# Patient Record
Sex: Male | Born: 1960 | Race: Black or African American | Hispanic: No | Marital: Single | Smoking: Former smoker
Health system: Southern US, Community
[De-identification: ages and names within clinical notes are randomized; demographics above are authoritative.]

## PROBLEM LIST (undated history)

## (undated) DIAGNOSIS — J45909 Unspecified asthma, uncomplicated: Secondary | ICD-10-CM

## (undated) DIAGNOSIS — F411 Generalized anxiety disorder: Secondary | ICD-10-CM

## (undated) DIAGNOSIS — R569 Unspecified convulsions: Secondary | ICD-10-CM

## (undated) DIAGNOSIS — E739 Lactose intolerance, unspecified: Secondary | ICD-10-CM

## (undated) DIAGNOSIS — C801 Malignant (primary) neoplasm, unspecified: Secondary | ICD-10-CM

## (undated) DIAGNOSIS — M722 Plantar fascial fibromatosis: Secondary | ICD-10-CM

## (undated) DIAGNOSIS — I10 Essential (primary) hypertension: Secondary | ICD-10-CM

## (undated) DIAGNOSIS — R011 Cardiac murmur, unspecified: Secondary | ICD-10-CM

## (undated) DIAGNOSIS — F329 Major depressive disorder, single episode, unspecified: Secondary | ICD-10-CM

## (undated) DIAGNOSIS — M25519 Pain in unspecified shoulder: Secondary | ICD-10-CM

## (undated) DIAGNOSIS — H01009 Unspecified blepharitis unspecified eye, unspecified eyelid: Secondary | ICD-10-CM

## (undated) DIAGNOSIS — Z87442 Personal history of urinary calculi: Secondary | ICD-10-CM

## (undated) DIAGNOSIS — F1010 Alcohol abuse, uncomplicated: Secondary | ICD-10-CM

## (undated) DIAGNOSIS — E785 Hyperlipidemia, unspecified: Secondary | ICD-10-CM

## (undated) HISTORY — DX: Personal history of urinary calculi: Z87.442

## (undated) HISTORY — DX: Unspecified blepharitis unspecified eye, unspecified eyelid: H01.009

## (undated) HISTORY — DX: Lactose intolerance, unspecified: E73.9

## (undated) HISTORY — DX: Generalized anxiety disorder: F41.1

## (undated) HISTORY — PX: HAND SURGERY: SHX662

## (undated) HISTORY — DX: Hyperlipidemia, unspecified: E78.5

## (undated) HISTORY — DX: Major depressive disorder, single episode, unspecified: F32.9

## (undated) HISTORY — DX: Plantar fascial fibromatosis: M72.2

## (undated) HISTORY — DX: Pain in unspecified shoulder: M25.519

## (undated) HISTORY — DX: Unspecified asthma, uncomplicated: J45.909

## (undated) HISTORY — DX: Essential (primary) hypertension: I10

---

## 1999-03-11 ENCOUNTER — Emergency Department (HOSPITAL_COMMUNITY): Admission: EM | Admit: 1999-03-11 | Discharge: 1999-03-11 | Payer: Self-pay | Admitting: Emergency Medicine

## 1999-03-11 ENCOUNTER — Encounter: Payer: Self-pay | Admitting: Emergency Medicine

## 1999-03-12 ENCOUNTER — Emergency Department (HOSPITAL_COMMUNITY): Admission: EM | Admit: 1999-03-12 | Discharge: 1999-03-12 | Payer: Self-pay | Admitting: Emergency Medicine

## 2003-01-09 ENCOUNTER — Emergency Department (HOSPITAL_COMMUNITY): Admission: EM | Admit: 2003-01-09 | Discharge: 2003-01-09 | Payer: Self-pay | Admitting: Emergency Medicine

## 2003-11-22 ENCOUNTER — Emergency Department (HOSPITAL_COMMUNITY): Admission: EM | Admit: 2003-11-22 | Discharge: 2003-11-22 | Payer: Self-pay | Admitting: Emergency Medicine

## 2004-05-08 ENCOUNTER — Emergency Department (HOSPITAL_COMMUNITY): Admission: EM | Admit: 2004-05-08 | Discharge: 2004-05-09 | Payer: Self-pay | Admitting: Emergency Medicine

## 2005-06-04 HISTORY — PX: OTHER SURGICAL HISTORY: SHX169

## 2005-08-31 ENCOUNTER — Emergency Department (HOSPITAL_COMMUNITY): Admission: EM | Admit: 2005-08-31 | Discharge: 2005-08-31 | Payer: Self-pay | Admitting: Emergency Medicine

## 2007-07-14 ENCOUNTER — Emergency Department (HOSPITAL_COMMUNITY): Admission: EM | Admit: 2007-07-14 | Discharge: 2007-07-14 | Payer: Self-pay | Admitting: Emergency Medicine

## 2007-07-16 ENCOUNTER — Encounter: Payer: Self-pay | Admitting: Internal Medicine

## 2007-07-22 ENCOUNTER — Ambulatory Visit: Payer: Self-pay | Admitting: Internal Medicine

## 2007-07-22 DIAGNOSIS — E739 Lactose intolerance, unspecified: Secondary | ICD-10-CM

## 2007-07-22 DIAGNOSIS — F3289 Other specified depressive episodes: Secondary | ICD-10-CM

## 2007-07-22 DIAGNOSIS — F411 Generalized anxiety disorder: Secondary | ICD-10-CM | POA: Insufficient documentation

## 2007-07-22 DIAGNOSIS — I10 Essential (primary) hypertension: Secondary | ICD-10-CM

## 2007-07-22 DIAGNOSIS — F329 Major depressive disorder, single episode, unspecified: Secondary | ICD-10-CM

## 2007-07-22 DIAGNOSIS — Z87442 Personal history of urinary calculi: Secondary | ICD-10-CM

## 2007-07-22 DIAGNOSIS — E785 Hyperlipidemia, unspecified: Secondary | ICD-10-CM

## 2007-07-22 HISTORY — DX: Hyperlipidemia, unspecified: E78.5

## 2007-07-22 HISTORY — DX: Other specified depressive episodes: F32.89

## 2007-07-22 HISTORY — DX: Essential (primary) hypertension: I10

## 2007-07-22 HISTORY — DX: Personal history of urinary calculi: Z87.442

## 2007-07-22 HISTORY — DX: Major depressive disorder, single episode, unspecified: F32.9

## 2007-07-22 HISTORY — DX: Generalized anxiety disorder: F41.1

## 2007-07-22 HISTORY — DX: Lactose intolerance, unspecified: E73.9

## 2007-07-22 LAB — CONVERTED CEMR LAB
ALT: 20 units/L (ref 0–53)
Albumin: 3.9 g/dL (ref 3.5–5.2)
Alkaline Phosphatase: 59 units/L (ref 39–117)
BUN: 7 mg/dL (ref 6–23)
Basophils Absolute: 0 10*3/uL (ref 0.0–0.1)
Basophils Relative: 1.2 % — ABNORMAL HIGH (ref 0.0–1.0)
Bilirubin Urine: NEGATIVE
CO2: 29 meq/L (ref 19–32)
Calcium: 9 mg/dL (ref 8.4–10.5)
Chloride: 105 meq/L (ref 96–112)
Creatinine, Ser: 0.9 mg/dL (ref 0.4–1.5)
HDL: 53 mg/dL (ref >39.0)
Hemoglobin, Urine: NEGATIVE
Ketones, ur: NEGATIVE mg/dL
LDL Cholesterol: 117 mg/dL — ABNORMAL HIGH (ref 0–99)
Leukocytes, UA: NEGATIVE
MCHC: 34 g/dL (ref 30.0–36.0)
Monocytes Relative: 9.5 % (ref 3.0–11.0)
PSA: 1.18 ng/mL (ref 0.10–4.00)
Platelets: 206 10*3/uL (ref 150–400)
Potassium: 3.9 meq/L (ref 3.5–5.1)
RBC: 4.38 M/uL (ref 4.22–5.81)
RDW: 13.1 % (ref 11.5–14.6)
Specific Gravity, Urine: 1.02 (ref 1.000–1.03)
Total Bilirubin: 0.5 mg/dL (ref 0.3–1.2)
Total Protein, Urine: NEGATIVE mg/dL
Triglycerides: 70 mg/dL (ref 0–149)
VLDL: 14 mg/dL (ref 0–40)
pH: 6.5 (ref 5.0–8.0)

## 2007-07-29 ENCOUNTER — Telehealth (INDEPENDENT_AMBULATORY_CARE_PROVIDER_SITE_OTHER): Payer: Self-pay | Admitting: *Deleted

## 2007-08-08 ENCOUNTER — Encounter: Payer: Self-pay | Admitting: Internal Medicine

## 2007-08-19 ENCOUNTER — Ambulatory Visit: Payer: Self-pay | Admitting: Internal Medicine

## 2008-01-03 ENCOUNTER — Emergency Department (HOSPITAL_COMMUNITY): Admission: EM | Admit: 2008-01-03 | Discharge: 2008-01-03 | Payer: Self-pay | Admitting: Emergency Medicine

## 2008-09-11 ENCOUNTER — Emergency Department (HOSPITAL_COMMUNITY): Admission: EM | Admit: 2008-09-11 | Discharge: 2008-09-11 | Payer: Self-pay | Admitting: Emergency Medicine

## 2008-10-12 ENCOUNTER — Ambulatory Visit: Payer: Self-pay | Admitting: Internal Medicine

## 2008-10-12 LAB — CONVERTED CEMR LAB
Albumin: 3.9 g/dL (ref 3.5–5.2)
BUN: 12 mg/dL (ref 6–23)
Basophils Absolute: 0.2 10*3/uL — ABNORMAL HIGH (ref 0.0–0.1)
Bilirubin, Direct: 0.1 mg/dL (ref 0.0–0.3)
CO2: 32 meq/L (ref 19–32)
Chloride: 105 meq/L (ref 96–112)
Cholesterol: 204 mg/dL — ABNORMAL HIGH (ref 0–200)
Direct LDL: 137.9 mg/dL
Hemoglobin, Urine: NEGATIVE
Lymphocytes Relative: 52.8 % — ABNORMAL HIGH (ref 12.0–46.0)
Monocytes Relative: 9.1 % (ref 3.0–12.0)
Neutrophils Relative %: 23.3 % — ABNORMAL LOW (ref 43.0–77.0)
Nitrite: NEGATIVE
Platelets: 185 10*3/uL (ref 150.0–400.0)
Potassium: 4.2 meq/L (ref 3.5–5.1)
RDW: 12.9 % (ref 11.5–14.6)
TSH: 1.64 microintl units/mL (ref 0.35–5.50)
Total CHOL/HDL Ratio: 4
Total Protein: 7.3 g/dL (ref 6.0–8.3)
Triglycerides: 63 mg/dL (ref 0.0–149.0)
Urobilinogen, UA: 0.2 (ref 0.0–1.0)
VLDL: 12.6 mg/dL (ref 0.0–40.0)

## 2008-10-13 ENCOUNTER — Ambulatory Visit: Payer: Self-pay | Admitting: Internal Medicine

## 2008-10-25 ENCOUNTER — Encounter: Admission: RE | Admit: 2008-10-25 | Discharge: 2008-10-25 | Payer: Self-pay | Admitting: Occupational Medicine

## 2009-02-14 ENCOUNTER — Emergency Department (HOSPITAL_COMMUNITY): Admission: EM | Admit: 2009-02-14 | Discharge: 2009-02-14 | Payer: Self-pay | Admitting: Emergency Medicine

## 2009-02-24 ENCOUNTER — Ambulatory Visit: Payer: Self-pay | Admitting: Internal Medicine

## 2009-02-24 DIAGNOSIS — H01009 Unspecified blepharitis unspecified eye, unspecified eyelid: Secondary | ICD-10-CM

## 2009-02-24 HISTORY — DX: Unspecified blepharitis unspecified eye, unspecified eyelid: H01.009

## 2009-03-31 ENCOUNTER — Ambulatory Visit (HOSPITAL_BASED_OUTPATIENT_CLINIC_OR_DEPARTMENT_OTHER): Admission: RE | Admit: 2009-03-31 | Discharge: 2009-03-31 | Payer: Self-pay | Admitting: Ophthalmology

## 2009-11-24 ENCOUNTER — Ambulatory Visit: Payer: Self-pay | Admitting: Internal Medicine

## 2009-11-24 LAB — CONVERTED CEMR LAB
AST: 18 units/L (ref 0–37)
Basophils Absolute: 0 10*3/uL (ref 0.0–0.1)
CO2: 30 meq/L (ref 19–32)
Calcium: 9.2 mg/dL (ref 8.4–10.5)
Chloride: 105 meq/L (ref 96–112)
HCT: 42 % (ref 39.0–52.0)
HDL: 60.7 mg/dL (ref >39.00)
Leukocytes, UA: NEGATIVE
Lymphs Abs: 1.5 10*3/uL (ref 0.7–4.0)
Monocytes Absolute: 0.2 10*3/uL (ref 0.1–1.0)
Monocytes Relative: 7.5 % (ref 3.0–12.0)
Nitrite: NEGATIVE
PSA: 1.25 ng/mL (ref 0.10–4.00)
Platelets: 199 10*3/uL (ref 150.0–400.0)
Potassium: 4.4 meq/L (ref 3.5–5.1)
RDW: 14.4 % (ref 11.5–14.6)
Sodium: 141 meq/L (ref 135–145)
Specific Gravity, Urine: >=1.03 (ref 1.000–1.030)
TSH: 0.68 microintl units/mL (ref 0.35–5.50)
Total Bilirubin: 0.7 mg/dL (ref 0.3–1.2)
Total CHOL/HDL Ratio: 3
Triglycerides: 86 mg/dL (ref 0.0–149.0)
Urobilinogen, UA: 1 (ref 0.0–1.0)

## 2009-11-25 ENCOUNTER — Ambulatory Visit: Payer: Self-pay | Admitting: Internal Medicine

## 2009-11-25 DIAGNOSIS — M722 Plantar fascial fibromatosis: Secondary | ICD-10-CM

## 2009-11-25 DIAGNOSIS — M25519 Pain in unspecified shoulder: Secondary | ICD-10-CM

## 2009-11-25 HISTORY — DX: Plantar fascial fibromatosis: M72.2

## 2009-11-25 HISTORY — DX: Pain in unspecified shoulder: M25.519

## 2009-11-30 ENCOUNTER — Encounter: Payer: Self-pay | Admitting: Internal Medicine

## 2009-12-01 ENCOUNTER — Encounter: Payer: Self-pay | Admitting: Internal Medicine

## 2010-05-05 ENCOUNTER — Telehealth: Payer: Self-pay | Admitting: Internal Medicine

## 2010-05-06 ENCOUNTER — Emergency Department (HOSPITAL_COMMUNITY)
Admission: EM | Admit: 2010-05-06 | Discharge: 2010-05-06 | Payer: Self-pay | Source: Home / Self Care | Admitting: Emergency Medicine

## 2010-07-04 NOTE — Consult Note (Signed)
Summary: [facility]  [facility]   Imported By: Lester Foley 12/15/2009 07:43:29  _____________________________________________________________________  External Attachment:    Type:   Image     Comment:   External Document

## 2010-07-04 NOTE — Progress Notes (Signed)
Summary: ST Cough  Phone Note Call from Patient   Caller: Patient [phone]  [phone] Summary of Call: Pt called stating he has a severe ST and cough, he has tried OTC Mucinex wth no relief. Pt is requesting MD advisement on OTC meds to help with sxs that are safe to use with Hypertention, please advise. Initial call taken by: Margaret Pyle, CMA,  May 05, 2010 1:05 PM  Follow-up for Phone Call        delsym OTC (12 hr) usually works well; ok for tessalon perle 100 mg - 1 -2 by mouth three times a day as needed #60 NO ref if he wants Follow-up by: Corwin Levins MD,  May 05, 2010 2:06 PM  Additional Follow-up for Phone Call Additional follow up Details #1::        Pt advised and requested RX as well. Additional Follow-up by: Margaret Pyle, CMA,  May 05, 2010 3:26 PM    New/Updated Medications: TESSALON PERLES 100 MG CAPS (BENZONATATE) 1-2 caps by mouth three times a day as needed for cough Prescriptions: TESSALON PERLES 100 MG CAPS (BENZONATATE) 1-2 caps by mouth three times a day as needed for cough  #60 x 0   Entered by:   Margaret Pyle, CMA   Authorized by:   Corwin Levins MD   Signed by:   Margaret Pyle, CMA on 05/05/2010   Method used:   Electronically to        [facility]. [phone]* (retail)       [address]       [place], [place]  [address]       Ph: [phone]       Fax: [phone]   RxID:   [id number]

## 2010-07-04 NOTE — Consult Note (Signed)
Summary: [facility]   Imported By: Sherian Rein 12/07/2009 14:42:18  _____________________________________________________________________  External Attachment:    Type:   Image     Comment:   External Document
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
## 2011-01-24 ENCOUNTER — Other Ambulatory Visit: Payer: Self-pay

## 2011-01-24 MED ORDER — LISINOPRIL 20 MG PO TABS: 20.0000 mg | ORAL_TABLET | Freq: Every day | ORAL | Status: DC

## 2011-02-02 ENCOUNTER — Other Ambulatory Visit: Payer: Self-pay | Admitting: Internal Medicine

## 2011-02-23 LAB — BASIC METABOLIC PANEL
BUN: 9
Calcium: 8.9
GFR calc non Af Amer: >60
Potassium: 3.8
Sodium: 140

## 2011-02-23 LAB — URINALYSIS, ROUTINE W REFLEX MICROSCOPIC
Leukocytes, UA: NEGATIVE
Protein, ur: NEGATIVE
Urobilinogen, UA: 0.2

## 2011-02-23 LAB — BASIC METABOLIC PANEL WITH GFR
CO2: 28
Chloride: 105
Creatinine, Ser: 0.81
GFR calc Af Amer: >60
Glucose, Bld: 96

## 2011-02-23 LAB — URINE MICROSCOPIC-ADD ON

## 2011-02-23 LAB — CBC
MCHC: 35.2
Platelets: 192
RDW: 14.1

## 2011-03-04 ENCOUNTER — Encounter: Payer: Self-pay | Admitting: Internal Medicine

## 2011-03-04 DIAGNOSIS — Z0001 Encounter for general adult medical examination with abnormal findings: Secondary | ICD-10-CM | POA: Insufficient documentation

## 2011-03-04 DIAGNOSIS — R7302 Impaired glucose tolerance (oral): Secondary | ICD-10-CM | POA: Insufficient documentation

## 2011-03-05 ENCOUNTER — Other Ambulatory Visit (INDEPENDENT_AMBULATORY_CARE_PROVIDER_SITE_OTHER): Payer: Managed Care, Other (non HMO)

## 2011-03-05 ENCOUNTER — Ambulatory Visit (INDEPENDENT_AMBULATORY_CARE_PROVIDER_SITE_OTHER): Payer: Managed Care, Other (non HMO) | Admitting: Internal Medicine

## 2011-03-05 ENCOUNTER — Other Ambulatory Visit: Payer: Self-pay | Admitting: Internal Medicine

## 2011-03-05 ENCOUNTER — Encounter: Payer: Self-pay | Admitting: Internal Medicine

## 2011-03-05 VITALS — BP 138/100 | HR 92 | Temp 97.8°F | Ht 69.0 in | Wt 216.4 lb

## 2011-03-05 DIAGNOSIS — Z0000 Encounter for general adult medical examination without abnormal findings: Secondary | ICD-10-CM

## 2011-03-05 DIAGNOSIS — R319 Hematuria, unspecified: Secondary | ICD-10-CM | POA: Insufficient documentation

## 2011-03-05 DIAGNOSIS — I10 Essential (primary) hypertension: Secondary | ICD-10-CM

## 2011-03-05 DIAGNOSIS — Z23 Encounter for immunization: Secondary | ICD-10-CM

## 2011-03-05 DIAGNOSIS — R7309 Other abnormal glucose: Secondary | ICD-10-CM

## 2011-03-05 DIAGNOSIS — R7302 Impaired glucose tolerance (oral): Secondary | ICD-10-CM

## 2011-03-05 LAB — BASIC METABOLIC PANEL
BUN: 16 mg/dL (ref 6–23)
Chloride: 102 mEq/L (ref 96–112)
Creatinine, Ser: 0.8 mg/dL (ref 0.4–1.5)
Glucose, Bld: 98 mg/dL (ref 70–99)

## 2011-03-05 LAB — HEPATIC FUNCTION PANEL
ALT: 16 U/L (ref 0–53)
AST: 21 U/L (ref 0–37)
Albumin: 4.2 g/dL (ref 3.5–5.2)
Total Bilirubin: 0.7 mg/dL (ref 0.3–1.2)
Total Protein: 7.7 g/dL (ref 6.0–8.3)

## 2011-03-05 LAB — LIPID PANEL
Cholesterol: 202 mg/dL — ABNORMAL HIGH (ref 0–200)
HDL: 63.8 mg/dL (ref >39.00)
VLDL: 9.6 mg/dL (ref 0.0–40.0)

## 2011-03-05 LAB — URINALYSIS, ROUTINE W REFLEX MICROSCOPIC
Bilirubin Urine: NEGATIVE
Leukocytes, UA: NEGATIVE
Nitrite: NEGATIVE
Specific Gravity, Urine: >=1.03 (ref 1.000–1.030)
Total Protein, Urine: NEGATIVE
pH: 5.5 (ref 5.0–8.0)

## 2011-03-05 LAB — CBC WITH DIFFERENTIAL/PLATELET
Basophils Relative: 0.6 % (ref 0.0–3.0)
Eosinophils Relative: 7.9 % — ABNORMAL HIGH (ref 0.0–5.0)
Lymphocytes Relative: 47.1 % — ABNORMAL HIGH (ref 12.0–46.0)
MCV: 96.2 fl (ref 78.0–100.0)
Monocytes Relative: 9.1 % (ref 3.0–12.0)
Neutrophils Relative %: 35.3 % — ABNORMAL LOW (ref 43.0–77.0)
Platelets: 213 10*3/uL (ref 150.0–400.0)
RBC: 4.53 Mil/uL (ref 4.22–5.81)
WBC: 3.6 10*3/uL — ABNORMAL LOW (ref 4.5–10.5)

## 2011-03-05 LAB — TSH: TSH: 1.75 u[IU]/mL (ref 0.35–5.50)

## 2011-03-05 LAB — PSA: PSA: 1.3 ng/mL (ref 0.10–4.00)

## 2011-03-05 MED ORDER — LISINOPRIL 20 MG PO TABS: 20.0000 mg | ORAL_TABLET | Freq: Every day | ORAL | Status: DC

## 2011-03-05 MED ORDER — DICLOFENAC SODIUM 75 MG PO TBEC: DELAYED_RELEASE_TABLET | ORAL | Status: DC

## 2011-03-05 NOTE — Assessment & Plan Note (Signed)
Likely related to renal stone, for u/s to r/o obstruction or mass

## 2011-03-05 NOTE — Assessment & Plan Note (Signed)
Out of med  - uncontrolled today - for med re-start ,  to f/u any worsening symptoms or concerns  BP Readings from Last 3 Encounters:  03/05/11 138/100  11/25/09 142/98  02/24/09 154/110
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
## 2011-03-05 NOTE — Patient Instructions (Addendum)
You had the flu shot today You will be contacted regarding the referral for: kidney ultrasound to make sure no obstruction or mass Please return if you have any further blood in the urine, or call for urology referral Continue all other medications as before Please go to LAB in the Basement for the blood and/or urine tests to be done today Please call the phone number [phone] (the [employer] System) for results of testing in 2-3 days;  When calling, simply dial the number, and when prompted enter the MRN number above (the Medical Record Number) and the # key, then the message should start. Please return in 1 year for your yearly visit, or sooner if needed, with Lab testing done 3-5 days before

## 2011-03-05 NOTE — Assessment & Plan Note (Addendum)
Overall doing well, age appropriate education and counseling updated, referrals for preventative services and immunizations addressed, dietary and smoking counseling addressed, most recent labs and ECG reviewed.  I have personally reviewed and have noted: 1) the patient's medical and social history 2) The pt's use of alcohol, tobacco, and illicit drugs 3) The patient's current medications and supplements 4) Functional ability including ADL's, fall risk, home safety risk, hearing and visual impairment 5) Diet and physical activities 6) Evidence for depression or mood disorder 7) The patient's height, weight, and BMI have been recorded in the chart I have made referrals, and provided counseling and education based on review of the above For flu shot, labs today, ECG reviewed as per emr

## 2011-03-05 NOTE — Assessment & Plan Note (Signed)
asymtp - for a1c today 

## 2011-03-08 ENCOUNTER — Other Ambulatory Visit: Payer: Managed Care, Other (non HMO)

## 2011-03-22 ENCOUNTER — Ambulatory Visit
Admission: RE | Admit: 2011-03-22 | Discharge: 2011-03-22 | Disposition: A | Discharge status: Home / Self Care | Payer: Managed Care, Other (non HMO) | Source: Ambulatory Visit | Attending: Internal Medicine | Admitting: Internal Medicine

## 2011-06-08 ENCOUNTER — Encounter: Payer: Self-pay | Admitting: Internal Medicine

## 2011-06-08 ENCOUNTER — Ambulatory Visit (INDEPENDENT_AMBULATORY_CARE_PROVIDER_SITE_OTHER): Payer: Managed Care, Other (non HMO) | Admitting: Internal Medicine

## 2011-06-08 VITALS — BP 144/100 | HR 89 | Temp 99.0°F | Ht 69.0 in | Wt 217.0 lb

## 2011-06-08 DIAGNOSIS — M25519 Pain in unspecified shoulder: Secondary | ICD-10-CM

## 2011-06-08 DIAGNOSIS — I10 Essential (primary) hypertension: Secondary | ICD-10-CM

## 2011-06-08 DIAGNOSIS — M25511 Pain in right shoulder: Secondary | ICD-10-CM | POA: Insufficient documentation

## 2011-06-08 DIAGNOSIS — B9789 Other viral agents as the cause of diseases classified elsewhere: Secondary | ICD-10-CM

## 2011-06-08 DIAGNOSIS — B349 Viral infection, unspecified: Secondary | ICD-10-CM

## 2011-06-08 MED ORDER — DIPHENOXYLATE-ATROPINE 2.5-0.025 MG PO TABS: 1.0000 | ORAL_TABLET | Freq: Four times a day (QID) | ORAL | Status: AC | PRN

## 2011-06-08 MED ORDER — OSELTAMIVIR PHOSPHATE 75 MG PO CAPS: 75.0000 mg | ORAL_CAPSULE | Freq: Two times a day (BID) | ORAL | Status: AC

## 2011-06-08 MED ORDER — PROMETHAZINE HCL 25 MG PO TABS: 25.0000 mg | ORAL_TABLET | Freq: Four times a day (QID) | ORAL | Status: AC | PRN

## 2011-06-08 NOTE — Assessment & Plan Note (Addendum)
?   Rot cuff vs DJD - for refer to [facility], Continue all other medications as before,  to f/u any worsening symptoms or concerns

## 2011-06-08 NOTE — Patient Instructions (Addendum)
You have what appears to be the flu today Take all new medications as prescribed - the Tamiflu, as well as the nausea and diarrhea medication if needed Continue all other medications as before Please drink lots of fluids, as well as tylenol or advil for pain if needed You are given the work note as discussed You will be contacted regarding the referral for: [facility]  The fax number here :  [phone]  Or [phone]

## 2011-06-09 ENCOUNTER — Encounter: Payer: Self-pay | Admitting: Internal Medicine

## 2011-06-09 DIAGNOSIS — B349 Viral infection, unspecified: Secondary | ICD-10-CM | POA: Insufficient documentation

## 2011-06-09 NOTE — Assessment & Plan Note (Signed)
clincial dx of influenza  - for tamilfu asd, to f/u any worsening symptoms or concerns

## 2011-06-09 NOTE — Assessment & Plan Note (Signed)
Mild increased today most likely situational, most recent data reviewed with pt, and pt to continue medical treatment as before  BP Readings from Last 3 Encounters:  06/08/11 144/100  03/05/11 138/100  11/25/09 142/98
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
## 2011-06-11 ENCOUNTER — Ambulatory Visit: Payer: Managed Care, Other (non HMO) | Admitting: Internal Medicine

## 2011-12-02 ENCOUNTER — Emergency Department (HOSPITAL_COMMUNITY): Payer: Managed Care, Other (non HMO)

## 2011-12-02 ENCOUNTER — Emergency Department (HOSPITAL_COMMUNITY)
Admission: EM | Admit: 2011-12-02 | Discharge: 2011-12-02 | Disposition: A | Discharge status: Home / Self Care | Payer: Managed Care, Other (non HMO) | Attending: Emergency Medicine | Admitting: Emergency Medicine

## 2011-12-02 ENCOUNTER — Encounter (HOSPITAL_COMMUNITY): Payer: Self-pay | Admitting: Emergency Medicine

## 2011-12-02 DIAGNOSIS — R109 Unspecified abdominal pain: Secondary | ICD-10-CM | POA: Insufficient documentation

## 2011-12-02 DIAGNOSIS — N201 Calculus of ureter: Secondary | ICD-10-CM

## 2011-12-02 DIAGNOSIS — I10 Essential (primary) hypertension: Secondary | ICD-10-CM | POA: Insufficient documentation

## 2011-12-02 LAB — URINALYSIS, ROUTINE W REFLEX MICROSCOPIC
Bilirubin Urine: NEGATIVE
Glucose, UA: NEGATIVE mg/dL
Specific Gravity, Urine: 1.017 (ref 1.005–1.030)
pH: 8 (ref 5.0–8.0)

## 2011-12-02 LAB — URINE MICROSCOPIC-ADD ON

## 2011-12-02 MED ORDER — ONDANSETRON 8 MG PO TBDP
8.0000 mg | ORAL_TABLET | Freq: Once | ORAL | Status: AC
  Administered 2011-12-02: 8 mg via ORAL
  Filled 2011-12-02: qty 1

## 2011-12-02 MED ORDER — ONDANSETRON HCL 4 MG PO TABS: 4.0000 mg | ORAL_TABLET | Freq: Four times a day (QID) | ORAL | Status: AC

## 2011-12-02 MED ORDER — HYDROMORPHONE HCL PF 1 MG/ML IJ SOLN
1.0000 mg | Freq: Once | INTRAMUSCULAR | Status: AC
Start: 2011-12-02 — End: 2011-12-02
  Administered 2011-12-02: 1 mg via INTRAMUSCULAR
  Filled 2011-12-02: qty 1

## 2011-12-02 MED ORDER — OXYCODONE-ACETAMINOPHEN 5-325 MG PO TABS: 2.0000 | ORAL_TABLET | ORAL | Status: AC | PRN

## 2011-12-02 MED ORDER — HYDROMORPHONE HCL PF 1 MG/ML IJ SOLN
1.0000 mg | Freq: Once | INTRAMUSCULAR | Status: AC
  Administered 2011-12-02: 1 mg via INTRAMUSCULAR
  Filled 2011-12-02: qty 1

## 2011-12-02 NOTE — ED Notes (Signed)
Pt alert, nad, arrives from home, c/o right flank pain, onset was a few nights ago, describes as sharp radiating to abd, resp even unlabored, skin pwd, denies changes in bowel or bladder
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
## 2011-12-02 NOTE — Discharge Instructions (Signed)

## 2011-12-02 NOTE — ED Notes (Signed)
Patient given discharge instructions, information, prescriptions, and diet order. Patient states that they adequately understand discharge information given and to return to ED if symptoms return or worsen.    Patient given 2 Rx's. Patient aware to follow up with urology and given contact information.

## 2011-12-02 NOTE — ED Notes (Addendum)
Pt sts pain, began 3-4 days ago, has gotten more intense. Patient has taken diclofenac at home with out relief. Patient sts he has been nauseated, no emesis, with sharp radiating right flank pain. No change in bowel or bladder habits.

## 2012-01-21 ENCOUNTER — Emergency Department (HOSPITAL_COMMUNITY)
Admission: EM | Admit: 2012-01-21 | Discharge: 2012-01-21 | Disposition: A | Discharge status: Home / Self Care | Payer: No Typology Code available for payment source | Attending: Emergency Medicine | Admitting: Emergency Medicine

## 2012-01-21 ENCOUNTER — Encounter (HOSPITAL_COMMUNITY): Payer: Self-pay | Admitting: Emergency Medicine

## 2012-01-21 ENCOUNTER — Emergency Department (HOSPITAL_COMMUNITY): Payer: No Typology Code available for payment source

## 2012-01-21 DIAGNOSIS — R7309 Other abnormal glucose: Secondary | ICD-10-CM | POA: Insufficient documentation

## 2012-01-21 DIAGNOSIS — Z7982 Long term (current) use of aspirin: Secondary | ICD-10-CM | POA: Insufficient documentation

## 2012-01-21 DIAGNOSIS — Z809 Family history of malignant neoplasm, unspecified: Secondary | ICD-10-CM | POA: Insufficient documentation

## 2012-01-21 DIAGNOSIS — Y92410 Unspecified street and highway as the place of occurrence of the external cause: Secondary | ICD-10-CM | POA: Insufficient documentation

## 2012-01-21 DIAGNOSIS — I10 Essential (primary) hypertension: Secondary | ICD-10-CM | POA: Insufficient documentation

## 2012-01-21 DIAGNOSIS — Z800 Family history of malignant neoplasm of digestive organs: Secondary | ICD-10-CM | POA: Insufficient documentation

## 2012-01-21 DIAGNOSIS — F411 Generalized anxiety disorder: Secondary | ICD-10-CM | POA: Insufficient documentation

## 2012-01-21 DIAGNOSIS — Z8249 Family history of ischemic heart disease and other diseases of the circulatory system: Secondary | ICD-10-CM | POA: Insufficient documentation

## 2012-01-21 DIAGNOSIS — M722 Plantar fascial fibromatosis: Secondary | ICD-10-CM | POA: Insufficient documentation

## 2012-01-21 DIAGNOSIS — E785 Hyperlipidemia, unspecified: Secondary | ICD-10-CM | POA: Insufficient documentation

## 2012-01-21 DIAGNOSIS — S46919A Strain of unspecified muscle, fascia and tendon at shoulder and upper arm level, unspecified arm, initial encounter: Secondary | ICD-10-CM

## 2012-01-21 DIAGNOSIS — F3289 Other specified depressive episodes: Secondary | ICD-10-CM | POA: Insufficient documentation

## 2012-01-21 DIAGNOSIS — Z8489 Family history of other specified conditions: Secondary | ICD-10-CM | POA: Insufficient documentation

## 2012-01-21 DIAGNOSIS — Z6379 Other stressful life events affecting family and household: Secondary | ICD-10-CM | POA: Insufficient documentation

## 2012-01-21 DIAGNOSIS — Z87891 Personal history of nicotine dependence: Secondary | ICD-10-CM | POA: Insufficient documentation

## 2012-01-21 DIAGNOSIS — Z833 Family history of diabetes mellitus: Secondary | ICD-10-CM | POA: Insufficient documentation

## 2012-01-21 DIAGNOSIS — Z87442 Personal history of urinary calculi: Secondary | ICD-10-CM | POA: Insufficient documentation

## 2012-01-21 DIAGNOSIS — F329 Major depressive disorder, single episode, unspecified: Secondary | ICD-10-CM | POA: Insufficient documentation

## 2012-01-21 MED ORDER — CYCLOBENZAPRINE HCL 10 MG PO TABS: 10.0000 mg | ORAL_TABLET | Freq: Two times a day (BID) | ORAL | Status: AC | PRN
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
## 2012-01-22 NOTE — ED Provider Notes (Signed)
Medical screening examination/treatment/procedure(s) were performed by non-physician practitioner and as supervising physician I was immediately available for consultation/collaboration.  Alleta Avery, MD 01/22/12 1520 

## 2012-02-26 ENCOUNTER — Ambulatory Visit (INDEPENDENT_AMBULATORY_CARE_PROVIDER_SITE_OTHER): Payer: Managed Care, Other (non HMO) | Admitting: Internal Medicine

## 2012-02-26 ENCOUNTER — Encounter: Payer: Self-pay | Admitting: Internal Medicine

## 2012-02-26 VITALS — BP 150/90 | HR 85 | Temp 98.3°F | Ht 69.0 in | Wt 221.6 lb

## 2012-02-26 DIAGNOSIS — M25512 Pain in left shoulder: Secondary | ICD-10-CM | POA: Insufficient documentation

## 2012-02-26 DIAGNOSIS — M25511 Pain in right shoulder: Secondary | ICD-10-CM

## 2012-02-26 DIAGNOSIS — M25519 Pain in unspecified shoulder: Secondary | ICD-10-CM

## 2012-02-26 DIAGNOSIS — I10 Essential (primary) hypertension: Secondary | ICD-10-CM

## 2012-02-26 MED ORDER — PREDNISONE 10 MG PO TABS: ORAL_TABLET | ORAL | Status: DC

## 2012-02-26 MED ORDER — IRBESARTAN 300 MG PO TABS: 300.0000 mg | ORAL_TABLET | Freq: Every day | ORAL | Status: DC

## 2012-02-26 MED ORDER — CYCLOBENZAPRINE HCL 5 MG PO TABS: 5.0000 mg | ORAL_TABLET | Freq: Three times a day (TID) | ORAL | Status: DC | PRN

## 2012-02-26 MED ORDER — TRAMADOL HCL 50 MG PO TABS: 50.0000 mg | ORAL_TABLET | Freq: Four times a day (QID) | ORAL | Status: DC | PRN
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
## 2012-02-26 NOTE — Assessment & Plan Note (Signed)
With prob rot cuff tear/disease, has seen ortho but pt deferring MRI/possible so far due to cost, cont pain control
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
## 2012-02-26 NOTE — Assessment & Plan Note (Signed)
Chart reviewed, persistent mild uncontrolled, to change Linsiopril to 300 qd avapro,  to f/u any worsening symptoms or concerns BP Readings from Last 3 Encounters:  02/26/12 150/90  01/21/12 165/102  12/02/11 158/90

## 2012-02-26 NOTE — Patient Instructions (Addendum)
Ok to stop the lisinopril Please start the generic Avapro at 300 mg per day Take all new medications as prescribed - also the ultram generic for pain, prednisone, and also muscle relaxer  You will be contacted regarding the referral for: orthopedic

## 2012-04-01 ENCOUNTER — Ambulatory Visit: Payer: Managed Care, Other (non HMO) | Admitting: Internal Medicine

## 2012-04-01 DIAGNOSIS — Z0289 Encounter for other administrative examinations: Secondary | ICD-10-CM

## 2012-06-03 ENCOUNTER — Other Ambulatory Visit: Payer: Self-pay | Admitting: Internal Medicine

## 2012-12-09 ENCOUNTER — Telehealth: Payer: Self-pay

## 2012-12-09 DIAGNOSIS — Z0000 Encounter for general adult medical examination without abnormal findings: Secondary | ICD-10-CM

## 2012-12-09 NOTE — Telephone Encounter (Signed)
Labs entered.

## 2013-01-01 ENCOUNTER — Other Ambulatory Visit (INDEPENDENT_AMBULATORY_CARE_PROVIDER_SITE_OTHER): Payer: Managed Care, Other (non HMO)

## 2013-01-01 ENCOUNTER — Other Ambulatory Visit: Payer: Self-pay | Admitting: Internal Medicine

## 2013-01-01 DIAGNOSIS — Z0000 Encounter for general adult medical examination without abnormal findings: Secondary | ICD-10-CM

## 2013-01-01 LAB — URINALYSIS, ROUTINE W REFLEX MICROSCOPIC
Bilirubin Urine: NEGATIVE
Nitrite: NEGATIVE
Urine Glucose: NEGATIVE
pH: 6 (ref 5.0–8.0)

## 2013-01-01 LAB — CBC WITH DIFFERENTIAL/PLATELET
Basophils Absolute: 0 10*3/uL (ref 0.0–0.1)
Eosinophils Relative: 2.8 % (ref 0.0–5.0)
HCT: 41.9 % (ref 39.0–52.0)
Hemoglobin: 13.9 g/dL (ref 13.0–17.0)
Lymphocytes Relative: 37 % (ref 12.0–46.0)
Lymphs Abs: 1.4 10*3/uL (ref 0.7–4.0)
Monocytes Relative: 10.1 % (ref 3.0–12.0)
Neutro Abs: 1.8 10*3/uL (ref 1.4–7.7)
RBC: 4.38 Mil/uL (ref 4.22–5.81)
RDW: 14.2 % (ref 11.5–14.6)
WBC: 3.7 10*3/uL — ABNORMAL LOW (ref 4.5–10.5)

## 2013-01-02 LAB — BASIC METABOLIC PANEL
CO2: 29 mEq/L (ref 19–32)
Calcium: 9.7 mg/dL (ref 8.4–10.5)
Chloride: 103 mEq/L (ref 96–112)
Creatinine, Ser: 0.7 mg/dL (ref 0.4–1.5)
Glucose, Bld: 93 mg/dL (ref 70–99)

## 2013-01-02 LAB — LIPID PANEL: Cholesterol: 199 mg/dL (ref 0–200)

## 2013-01-02 LAB — PSA: PSA: 1.81 ng/mL (ref 0.10–4.00)

## 2013-01-09 ENCOUNTER — Ambulatory Visit (INDEPENDENT_AMBULATORY_CARE_PROVIDER_SITE_OTHER): Payer: Managed Care, Other (non HMO) | Admitting: Internal Medicine

## 2013-01-09 ENCOUNTER — Encounter: Payer: Self-pay | Admitting: Internal Medicine

## 2013-01-09 VITALS — BP 142/100 | HR 85 | Temp 98.9°F | Ht 69.0 in | Wt 216.1 lb

## 2013-01-09 DIAGNOSIS — I10 Essential (primary) hypertension: Secondary | ICD-10-CM

## 2013-01-09 DIAGNOSIS — Z0000 Encounter for general adult medical examination without abnormal findings: Secondary | ICD-10-CM

## 2013-01-09 DIAGNOSIS — L84 Corns and callosities: Secondary | ICD-10-CM

## 2013-01-09 NOTE — Patient Instructions (Signed)
Your EKG was OK today You will be contacted regarding the referral for: colonoscopy, and podiatry Please continue all other medications as before, and refills have been done if requested. Please monitor your blood pressure at home or local pharmacy closely, and let us know if your BP is consistently more than 140/95, as you will likely need additional medication Please continue your efforts at being more active, low cholesterol diet, and weight control. You are otherwise up to date with prevention measures today. Please keep your appointments with your specialists as you may have planned  Please remember to sign up for My Chart if you have not done so, as this will be important to you in the future with finding out test results, communicating by private email, and scheduling acute appointments online when needed.  Please return in 6 months, or sooner if needed

## 2013-01-09 NOTE — Assessment & Plan Note (Signed)
Declines med change for now, but to check dialy for 2 wks and let us know results, f/u at 6 mo
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
## 2013-01-09 NOTE — Assessment & Plan Note (Signed)

## 2013-02-26 ENCOUNTER — Encounter: Payer: Self-pay | Admitting: Gastroenterology

## 2013-04-06 ENCOUNTER — Other Ambulatory Visit: Payer: Self-pay | Admitting: Internal Medicine

## 2013-04-08 ENCOUNTER — Ambulatory Visit (INDEPENDENT_AMBULATORY_CARE_PROVIDER_SITE_OTHER): Payer: Managed Care, Other (non HMO)

## 2013-04-08 DIAGNOSIS — Z23 Encounter for immunization: Secondary | ICD-10-CM

## 2013-07-17 ENCOUNTER — Ambulatory Visit: Payer: Managed Care, Other (non HMO) | Admitting: Internal Medicine

## 2013-09-24 ENCOUNTER — Emergency Department (HOSPITAL_COMMUNITY): Payer: Managed Care, Other (non HMO)

## 2013-09-24 ENCOUNTER — Inpatient Hospital Stay (HOSPITAL_COMMUNITY)
Admission: EM | Admit: 2013-09-24 | Discharge: 2013-09-27 | DRG: 077 | Disposition: A | Discharge status: Home / Self Care | Payer: Managed Care, Other (non HMO) | Attending: Internal Medicine | Admitting: Internal Medicine

## 2013-09-24 ENCOUNTER — Encounter (HOSPITAL_COMMUNITY): Payer: Self-pay | Admitting: Emergency Medicine

## 2013-09-24 DIAGNOSIS — G9340 Encephalopathy, unspecified: Secondary | ICD-10-CM

## 2013-09-24 DIAGNOSIS — E785 Hyperlipidemia, unspecified: Secondary | ICD-10-CM

## 2013-09-24 DIAGNOSIS — Z8673 Personal history of transient ischemic attack (TIA), and cerebral infarction without residual deficits: Secondary | ICD-10-CM

## 2013-09-24 DIAGNOSIS — M25512 Pain in left shoulder: Secondary | ICD-10-CM

## 2013-09-24 DIAGNOSIS — F411 Generalized anxiety disorder: Secondary | ICD-10-CM

## 2013-09-24 DIAGNOSIS — R519 Headache, unspecified: Secondary | ICD-10-CM

## 2013-09-24 DIAGNOSIS — M25511 Pain in right shoulder: Secondary | ICD-10-CM

## 2013-09-24 DIAGNOSIS — F329 Major depressive disorder, single episode, unspecified: Secondary | ICD-10-CM | POA: Diagnosis present

## 2013-09-24 DIAGNOSIS — M722 Plantar fascial fibromatosis: Secondary | ICD-10-CM

## 2013-09-24 DIAGNOSIS — Z87442 Personal history of urinary calculi: Secondary | ICD-10-CM

## 2013-09-24 DIAGNOSIS — L84 Corns and callosities: Secondary | ICD-10-CM

## 2013-09-24 DIAGNOSIS — Z833 Family history of diabetes mellitus: Secondary | ICD-10-CM

## 2013-09-24 DIAGNOSIS — E739 Lactose intolerance, unspecified: Secondary | ICD-10-CM | POA: Diagnosis present

## 2013-09-24 DIAGNOSIS — R0789 Other chest pain: Secondary | ICD-10-CM | POA: Diagnosis present

## 2013-09-24 DIAGNOSIS — Z79899 Other long term (current) drug therapy: Secondary | ICD-10-CM

## 2013-09-24 DIAGNOSIS — R011 Cardiac murmur, unspecified: Secondary | ICD-10-CM

## 2013-09-24 DIAGNOSIS — I639 Cerebral infarction, unspecified: Secondary | ICD-10-CM

## 2013-09-24 DIAGNOSIS — Z7982 Long term (current) use of aspirin: Secondary | ICD-10-CM

## 2013-09-24 DIAGNOSIS — R079 Chest pain, unspecified: Secondary | ICD-10-CM

## 2013-09-24 DIAGNOSIS — R4182 Altered mental status, unspecified: Secondary | ICD-10-CM | POA: Diagnosis present

## 2013-09-24 DIAGNOSIS — Z800 Family history of malignant neoplasm of digestive organs: Secondary | ICD-10-CM

## 2013-09-24 DIAGNOSIS — I10 Essential (primary) hypertension: Secondary | ICD-10-CM

## 2013-09-24 DIAGNOSIS — I674 Hypertensive encephalopathy: Principal | ICD-10-CM | POA: Diagnosis present

## 2013-09-24 DIAGNOSIS — R7309 Other abnormal glucose: Secondary | ICD-10-CM | POA: Diagnosis present

## 2013-09-24 DIAGNOSIS — I619 Nontraumatic intracerebral hemorrhage, unspecified: Secondary | ICD-10-CM | POA: Diagnosis present

## 2013-09-24 DIAGNOSIS — E876 Hypokalemia: Secondary | ICD-10-CM | POA: Diagnosis present

## 2013-09-24 DIAGNOSIS — Z0000 Encounter for general adult medical examination without abnormal findings: Secondary | ICD-10-CM

## 2013-09-24 DIAGNOSIS — G9389 Other specified disorders of brain: Secondary | ICD-10-CM | POA: Diagnosis present

## 2013-09-24 DIAGNOSIS — F3289 Other specified depressive episodes: Secondary | ICD-10-CM

## 2013-09-24 DIAGNOSIS — R7302 Impaired glucose tolerance (oral): Secondary | ICD-10-CM

## 2013-09-24 DIAGNOSIS — R9431 Abnormal electrocardiogram [ECG] [EKG]: Secondary | ICD-10-CM

## 2013-09-24 DIAGNOSIS — F32A Depression, unspecified: Secondary | ICD-10-CM | POA: Diagnosis present

## 2013-09-24 DIAGNOSIS — Z87891 Personal history of nicotine dependence: Secondary | ICD-10-CM

## 2013-09-24 DIAGNOSIS — R51 Headache: Secondary | ICD-10-CM

## 2013-09-24 LAB — CBC WITH DIFFERENTIAL/PLATELET
BASOS PCT: 1 % (ref 0–1)
Basophils Absolute: 0 10*3/uL (ref 0.0–0.1)
EOS ABS: 0.2 10*3/uL (ref 0.0–0.7)
Eosinophils Relative: 4 % (ref 0–5)
HCT: 39.2 % (ref 39.0–52.0)
Hemoglobin: 13.7 g/dL (ref 13.0–17.0)
Lymphocytes Relative: 36 % (ref 12–46)
Lymphs Abs: 1.7 10*3/uL (ref 0.7–4.0)
MCH: 32.7 pg (ref 26.0–34.0)
MCHC: 34.9 g/dL (ref 30.0–36.0)
MCV: 93.6 fL (ref 78.0–100.0)
Monocytes Absolute: 0.5 10*3/uL (ref 0.1–1.0)
Monocytes Relative: 10 % (ref 3–12)
NEUTROS PCT: 49 % (ref 43–77)
Neutro Abs: 2.3 10*3/uL (ref 1.7–7.7)
PLATELETS: 188 10*3/uL (ref 150–400)
RBC: 4.19 MIL/uL — ABNORMAL LOW (ref 4.22–5.81)
RDW: 14.2 % (ref 11.5–15.5)
WBC: 4.7 10*3/uL (ref 4.0–10.5)

## 2013-09-24 LAB — COMPREHENSIVE METABOLIC PANEL
ALBUMIN: 4 g/dL (ref 3.5–5.2)
ALK PHOS: 62 U/L (ref 39–117)
ALT: 12 U/L (ref 0–53)
AST: 24 U/L (ref 0–37)
BUN: 12 mg/dL (ref 6–23)
CALCIUM: 9.3 mg/dL (ref 8.4–10.5)
CO2: 25 mEq/L (ref 19–32)
Chloride: 98 mEq/L (ref 96–112)
Creatinine, Ser: 0.73 mg/dL (ref 0.50–1.35)
GFR calc Af Amer: >90 mL/min (ref >90)
GFR calc non Af Amer: >90 mL/min (ref >90)
Glucose, Bld: 127 mg/dL — ABNORMAL HIGH (ref 70–99)
POTASSIUM: 3.9 meq/L (ref 3.7–5.3)
SODIUM: 137 meq/L (ref 137–147)
TOTAL PROTEIN: 7.8 g/dL (ref 6.0–8.3)
Total Bilirubin: 0.4 mg/dL (ref 0.3–1.2)

## 2013-09-24 LAB — I-STAT TROPONIN, ED: TROPONIN I, POC: 0.02 ng/mL (ref 0.00–0.08)

## 2013-09-24 MED ORDER — ACETAMINOPHEN 325 MG PO TABS
650.0000 mg | ORAL_TABLET | Freq: Once | ORAL | Status: AC
  Administered 2013-09-25: 650 mg via ORAL
  Filled 2013-09-24: qty 2

## 2013-09-24 MED ORDER — ASPIRIN 81 MG PO CHEW
324.0000 mg | CHEWABLE_TABLET | Freq: Once | ORAL | Status: AC
  Administered 2013-09-25: 324 mg via ORAL
  Filled 2013-09-24: qty 4
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
## 2013-09-24 NOTE — ED Notes (Addendum)
Per patient- started two days ago, felt "stressed out." Reports feeling aggravated at work lately. Does have family hx of MI and person hx of HTN. Does endorse chest pain with no radiation. Reports feeling "tightness" but denies SOB, syncope, or dizziness. Denies falls. Also reports feeling "cloudy" but not necessarily confused. Would be headed to do something and go in circles forgetting what he was doing. No other complaints at this time.
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
## 2013-09-24 NOTE — ED Notes (Signed)
Patient transported to CT 

## 2013-09-25 ENCOUNTER — Encounter (HOSPITAL_COMMUNITY): Payer: Self-pay | Admitting: Internal Medicine

## 2013-09-25 ENCOUNTER — Observation Stay (HOSPITAL_COMMUNITY): Payer: Managed Care, Other (non HMO)

## 2013-09-25 DIAGNOSIS — I6350 Cerebral infarction due to unspecified occlusion or stenosis of unspecified cerebral artery: Secondary | ICD-10-CM

## 2013-09-25 DIAGNOSIS — R7309 Other abnormal glucose: Secondary | ICD-10-CM

## 2013-09-25 DIAGNOSIS — F411 Generalized anxiety disorder: Secondary | ICD-10-CM

## 2013-09-25 DIAGNOSIS — I369 Nonrheumatic tricuspid valve disorder, unspecified: Secondary | ICD-10-CM

## 2013-09-25 DIAGNOSIS — I639 Cerebral infarction, unspecified: Secondary | ICD-10-CM | POA: Diagnosis present

## 2013-09-25 DIAGNOSIS — G9340 Encephalopathy, unspecified: Secondary | ICD-10-CM | POA: Insufficient documentation

## 2013-09-25 DIAGNOSIS — I10 Essential (primary) hypertension: Secondary | ICD-10-CM

## 2013-09-25 DIAGNOSIS — R011 Cardiac murmur, unspecified: Secondary | ICD-10-CM | POA: Diagnosis present

## 2013-09-25 DIAGNOSIS — R079 Chest pain, unspecified: Secondary | ICD-10-CM

## 2013-09-25 LAB — HEMOGLOBIN A1C
Hgb A1c MFr Bld: 5.7 % — ABNORMAL HIGH (ref <5.7)
MEAN PLASMA GLUCOSE: 117 mg/dL — AB (ref <117)

## 2013-09-25 LAB — CBC
HEMATOCRIT: 40.3 % (ref 39.0–52.0)
Hemoglobin: 13.8 g/dL (ref 13.0–17.0)
MCH: 31.9 pg (ref 26.0–34.0)
MCHC: 34.2 g/dL (ref 30.0–36.0)
MCV: 93.3 fL (ref 78.0–100.0)
Platelets: 191 10*3/uL (ref 150–400)
RBC: 4.32 MIL/uL (ref 4.22–5.81)
RDW: 14.1 % (ref 11.5–15.5)
WBC: 4 10*3/uL (ref 4.0–10.5)

## 2013-09-25 LAB — BASIC METABOLIC PANEL
BUN: 11 mg/dL (ref 6–23)
CHLORIDE: 96 meq/L (ref 96–112)
CO2: 25 mEq/L (ref 19–32)
CREATININE: 0.72 mg/dL (ref 0.50–1.35)
Calcium: 9 mg/dL (ref 8.4–10.5)
Glucose, Bld: 93 mg/dL (ref 70–99)
Potassium: 3.3 mEq/L — ABNORMAL LOW (ref 3.7–5.3)
Sodium: 134 mEq/L — ABNORMAL LOW (ref 137–147)

## 2013-09-25 LAB — TROPONIN I
Troponin I: <0.3 ng/mL (ref <0.30)
Troponin I: <0.3 ng/mL (ref <0.30)
Troponin I: <0.3 ng/mL (ref <0.30)

## 2013-09-25 LAB — LIPID PANEL
Cholesterol: 212 mg/dL — ABNORMAL HIGH (ref 0–200)
HDL: 82 mg/dL (ref >39)
LDL CALC: 116 mg/dL — AB (ref 0–99)
Total CHOL/HDL Ratio: 2.6 RATIO
Triglycerides: 68 mg/dL (ref <150)
VLDL: 14 mg/dL (ref 0–40)

## 2013-09-25 LAB — GLUCOSE, CAPILLARY
GLUCOSE-CAPILLARY: 105 mg/dL — AB (ref 70–99)
Glucose-Capillary: 141 mg/dL — ABNORMAL HIGH (ref 70–99)

## 2013-09-25 LAB — VITAMIN B12: Vitamin B-12: 367 pg/mL (ref 211–911)

## 2013-09-25 LAB — TSH: TSH: 3.76 u[IU]/mL (ref 0.350–4.500)

## 2013-09-25 MED ORDER — SIMVASTATIN 20 MG PO TABS
20.0000 mg | ORAL_TABLET | Freq: Every day | ORAL | Status: DC
  Administered 2013-09-25 – 2013-09-27 (×3): 20 mg via ORAL
  Filled 2013-09-25 (×3): qty 1

## 2013-09-25 MED ORDER — VITAMIN C 500 MG PO TABS
1000.0000 mg | ORAL_TABLET | Freq: Every day | ORAL | Status: DC
  Administered 2013-09-25 – 2013-09-27 (×3): 1000 mg via ORAL
  Filled 2013-09-25 (×3): qty 2

## 2013-09-25 MED ORDER — ASPIRIN 325 MG PO TABS
325.0000 mg | ORAL_TABLET | Freq: Every day | ORAL | Status: DC
  Administered 2013-09-25 – 2013-09-27 (×3): 325 mg via ORAL
  Filled 2013-09-25 (×3): qty 1

## 2013-09-25 MED ORDER — IRBESARTAN 300 MG PO TABS
300.0000 mg | ORAL_TABLET | Freq: Every day | ORAL | Status: DC
  Administered 2013-09-25 – 2013-09-27 (×3): 300 mg via ORAL
  Filled 2013-09-25 (×3): qty 1

## 2013-09-25 MED ORDER — INSULIN ASPART 100 UNIT/ML ~~LOC~~ SOLN
0.0000 [IU] | Freq: Three times a day (TID) | SUBCUTANEOUS | Status: DC
  Administered 2013-09-25 – 2013-09-26 (×3): 1 [IU] via SUBCUTANEOUS

## 2013-09-25 MED ORDER — OMEGA-3-ACID ETHYL ESTERS 1 G PO CAPS
1.0000 g | ORAL_CAPSULE | Freq: Every day | ORAL | Status: DC
  Administered 2013-09-25 – 2013-09-27 (×3): 1 g via ORAL
  Filled 2013-09-25 (×3): qty 1

## 2013-09-25 MED ORDER — ONDANSETRON HCL 4 MG/2ML IJ SOLN: 4.0000 mg | Freq: Four times a day (QID) | INTRAMUSCULAR | Status: DC | PRN

## 2013-09-25 MED ORDER — HYDRALAZINE HCL 20 MG/ML IJ SOLN
5.0000 mg | Freq: Four times a day (QID) | INTRAMUSCULAR | Status: DC | PRN
  Filled 2013-09-25: qty 0.25

## 2013-09-25 MED ORDER — CARVEDILOL 25 MG PO TABS
25.0000 mg | ORAL_TABLET | Freq: Two times a day (BID) | ORAL | Status: DC
  Administered 2013-09-25 – 2013-09-27 (×6): 25 mg via ORAL
  Filled 2013-09-25 (×7): qty 1

## 2013-09-25 MED ORDER — SODIUM CHLORIDE 0.9 % IV SOLN: 250.0000 mL | INTRAVENOUS | Status: DC | PRN

## 2013-09-25 MED ORDER — SODIUM CHLORIDE 0.9 % IJ SOLN
3.0000 mL | Freq: Two times a day (BID) | INTRAMUSCULAR | Status: DC
  Administered 2013-09-25 – 2013-09-26 (×4): 3 mL via INTRAVENOUS

## 2013-09-25 MED ORDER — ACETAMINOPHEN 650 MG RE SUPP: 650.0000 mg | Freq: Four times a day (QID) | RECTAL | Status: DC | PRN

## 2013-09-25 MED ORDER — VITAMIN B-12 1000 MCG PO TABS
1000.0000 ug | ORAL_TABLET | Freq: Every day | ORAL | Status: DC
  Administered 2013-09-25 – 2013-09-27 (×3): 1000 ug via ORAL
  Filled 2013-09-25 (×3): qty 1

## 2013-09-25 MED ORDER — ONDANSETRON HCL 4 MG PO TABS: 4.0000 mg | ORAL_TABLET | Freq: Four times a day (QID) | ORAL | Status: DC | PRN

## 2013-09-25 MED ORDER — POTASSIUM CHLORIDE CRYS ER 20 MEQ PO TBCR
40.0000 meq | EXTENDED_RELEASE_TABLET | Freq: Once | ORAL | Status: AC
  Administered 2013-09-25: 40 meq via ORAL
  Filled 2013-09-25: qty 2

## 2013-09-25 MED ORDER — SODIUM CHLORIDE 0.9 % IJ SOLN
3.0000 mL | INTRAMUSCULAR | Status: DC | PRN
  Administered 2013-09-25: 3 mL via INTRAVENOUS

## 2013-09-25 MED ORDER — PANTOPRAZOLE SODIUM 40 MG PO TBEC
40.0000 mg | DELAYED_RELEASE_TABLET | Freq: Every day | ORAL | Status: DC
  Administered 2013-09-25 – 2013-09-27 (×3): 40 mg via ORAL
  Filled 2013-09-25 (×3): qty 1

## 2013-09-25 MED ORDER — SODIUM CHLORIDE 0.9 % IJ SOLN
3.0000 mL | Freq: Two times a day (BID) | INTRAMUSCULAR | Status: DC
  Administered 2013-09-25 – 2013-09-26 (×3): 3 mL via INTRAVENOUS

## 2013-09-25 MED ORDER — HEPARIN SODIUM (PORCINE) 5000 UNIT/ML IJ SOLN
5000.0000 [IU] | Freq: Three times a day (TID) | INTRAMUSCULAR | Status: DC
  Administered 2013-09-25 – 2013-09-27 (×8): 5000 [IU] via SUBCUTANEOUS
  Filled 2013-09-25 (×10): qty 1

## 2013-09-25 MED ORDER — ACETAMINOPHEN 325 MG PO TABS
650.0000 mg | ORAL_TABLET | Freq: Four times a day (QID) | ORAL | Status: DC | PRN
  Administered 2013-09-26: 650 mg via ORAL
  Filled 2013-09-25: qty 2

## 2013-09-25 NOTE — Progress Notes (Signed)
UR completed 

## 2013-09-25 NOTE — Progress Notes (Signed)
I have reviewed all documentation from student nurse. 

## 2013-09-25 NOTE — Progress Notes (Signed)
[facility] PROGRESS NOTE  Jeral PinchRichard C Bratz ZOX:[id number] DOB: 04/09/1961 DOA: 09/24/2013 PCP: Oliver BarreJames John, MD  Assessment/Plan:  1-chest pain:  -resolved -cardiac enzymes negative, EKG with TWI in lateral leads no old EKG for comparison -Cards consulted  -continue  ASA and avapro; started on coreg BID and statins overnight  2-accelerated HTN: with HA's and mild intermittent encephalopathy.  -will continue avapro,  coreg BID and use PRN hydralazine  -low sodium diet  -BP better today  3-Mental status change/impaired concentration and difficulty focusing last week, could be related to BP -but CT with nonspecific changes, will get MRI brain  4.HYPERLIPIDEMIA: continue lovaza; now on zocor, LDL 116   5-Impaired glucose tolerance: -FU HbA1C.   6-hx of CVA (cerebral infarction): no residual deficit.  -continue ASA for secondary prevention   7-Heart murmur: will check 2-D echo   DVT: heparin   Code Status: Full  Family Communication: brother at bedside Dispo: pending above workup   Consultants:  Cards  HPI/Subjective: Feels better, no further CP, mentation back to baseline  Objective: Filed Vitals:   09/25/13 0512  BP: 154/102  Pulse: 78  Temp: 98 F (36.7 C)  Resp: 18    Intake/Output Summary (Last 24 hours) at 09/25/13 1309 Last data filed at 09/25/13 0758  Gross per 24 hour  Intake    123 ml  Output      0 ml  Net    123 ml   Filed Weights   09/25/13 0049  Weight: 95.7 kg (210 lb 15.7 oz)    Exam:   General:  AAOx3  Cardiovascular: S1S2/RRR  Respiratory: CTAB  Abdomen: soft, Nt, BS present  Musculoskeletal: no edema c/c  NEuro: non focal   Data Reviewed: Basic Metabolic Panel:  Recent Labs Lab 09/24/13 2042 09/25/13 0203  NA 137 134*  K 3.9 3.3*  CL 98 96  CO2 25 25  GLUCOSE 127* 93  BUN 12 11  CREATININE 0.73 0.72  CALCIUM 9.3 9.0   Liver Function Tests:  Recent Labs Lab 09/24/13 2042  AST 24  ALT 12  ALKPHOS  62  BILITOT 0.4  PROT 7.8  ALBUMIN 4.0   No results found for this basename: LIPASE, AMYLASE,  in the last 168 hours No results found for this basename: AMMONIA,  in the last 168 hours CBC:  Recent Labs Lab 09/24/13 2042 09/25/13 0203  WBC 4.7 4.0  NEUTROABS 2.3  --   HGB 13.7 13.8  HCT 39.2 40.3  MCV 93.6 93.3  PLT 188 191   Cardiac Enzymes:  Recent Labs Lab 09/25/13 0203 09/25/13 0647  TROPONINI <0.30 <0.30   BNP (last 3 results) No results found for this basename: PROBNP,  in the last 8760 hours CBG:  Recent Labs Lab 09/25/13 0727 09/25/13 1122  GLUCAP 105* 141*    No results found for this or any previous visit (from the past 240 hour(s)).   Studies: Dg Chest 2 View  09/24/2013   CLINICAL DATA:  Disorientation, headaches, and Mid chest pain for 2 days.  EXAM: CHEST  2 VIEW  COMPARISON:  CT ABD/PELV WO CM dated 12/02/2011; DG RIBS UNILATERAL W/CHEST*L* dated 10/25/2008  FINDINGS: The heart size and mediastinal contours are within normal limits. Both lungs are clear. The visualized skeletal structures are unremarkable.  IMPRESSION: No active cardiopulmonary disease.   Electronically Signed   By: Burman NievesWilliam  Stevens M.D.   On: 09/24/2013 22:42   Ct Head Wo Contrast  09/24/2013   CLINICAL DATA:  Hypertension.  Altered mental status.  EXAM: CT HEAD WITHOUT CONTRAST  TECHNIQUE: Contiguous axial images were obtained from the base of the skull through the vertex without intravenous contrast.  COMPARISON:  None.  FINDINGS: Mild diffuse cerebral atrophy. Mild ventricular dilatation is likely due to central atrophy. There is focal low-attenuation change in the white matter of the right inferior frontal lobe and anterior temporal region. This probably represents old infarct but white matter edema due to a focal lesion is not excluded and MRI is recommended for correlation. No other significant white matter disease. No mass effect or midline shift. No abnormal extra-axial fluid  collections. Gray-white matter junctions are distinct. Basal cisterns are not effaced. No evidence of acute intracranial hemorrhage. Calvarium appears intact. Opacification of some of the ethmoid air cells and mucosal thickening in the maxillary antra.  IMPRESSION: Nonspecific low attenuation in the right inferior frontal/anterior temporal white matter. Changes likely represent old ischemia but occult mass lesion not excluded and MRI is recommended. No acute intracranial hemorrhage.   Electronically Signed   By: Burman NievesWilliam  Stevens M.D.   On: 09/24/2013 22:56    Scheduled Meds: . aspirin  325 mg Oral Daily  . carvedilol  25 mg Oral BID WC  . heparin  5,000 Units Subcutaneous 3 times per day  . insulin aspart  0-9 Units Subcutaneous TID WC  . irbesartan  300 mg Oral Daily  . omega-3 acid ethyl esters  1 g Oral Daily  . pantoprazole  40 mg Oral Q1200  . potassium chloride  40 mEq Oral Once  . simvastatin  20 mg Oral q1800  . sodium chloride  3 mL Intravenous Q12H  . sodium chloride  3 mL Intravenous Q12H  . vitamin B-12  1,000 mcg Oral Daily  . vitamin C  1,000 mg Oral Daily   Continuous Infusions:  Antibiotics Given (last 72 hours)   None      Principal Problem:   Chest pain Active Problems:   HYPERLIPIDEMIA   DEPRESSION   Impaired glucose tolerance   Hypertension   CVA (cerebral infarction)   Acute encephalopathy   Heart murmur    Time spent: 30min    Zannie CovePreetha Jermar Colter  [facility] Pager [phone]. If 7PM-7AM, please contact night-coverage at [email or web address], password [email or web address] 09/25/2013, 1:09 PM  LOS: 1 day
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
## 2013-09-25 NOTE — Evaluation (Signed)
Physical Therapy Evaluation Patient Details Name: Jamie Stewart MRN: [id number] DOB: 05-17-1961 Today's Date: 09/25/2013   History of Present Illness  Pt is a 53 year old male presenting to the ED with intermittent chest pain, elevated blood pressure, and mental cloudiness; CT head scan unclear results, scheduled for MRI for further imaging; PMH of HTN, CVA, depression, and anxiety  Clinical Impression  Pt admitted with intermittent chest pain, elevated BP, and mental cloudiness.  Pt had intact sensation in all four extremities.  Able to ambulate in hallway without balance or functional strength deficits.  Pt evaluation time limited due to elevated blood pressure (164/111 mmHg) and pt due for MRI, technician waiting however pt within normal functioning limits.  No further acute PT needs identified.  Pt has no further follow-up questions.  PT is signing off.  Thank you for this referral.    Follow Up Recommendations No PT follow up    Equipment Recommendations  None recommended by PT    Recommendations for Other Services       Precautions / Restrictions Precautions Precautions: None Restrictions Weight Bearing Restrictions: No      Mobility  Bed Mobility Overal bed mobility: Needs Assistance Bed Mobility: Supine to Sit     Supine to sit: Supervision;HOB elevated        Transfers Overall transfer level: Needs assistance Equipment used: None Transfers: Sit to/from Stand Sit to Stand: Supervision         General transfer comment: verbal cues for safety  Ambulation/Gait Ambulation/Gait assistance: Supervision Ambulation Distance (Feet): 80 Feet Assistive device: None Gait Pattern/deviations: WFL(Within Functional Limits)     General Gait Details: no difficulties with balance or stregnth; limited ambulation distance due to high blood pressure and technician arrived to take pt to MRI  Stairs            Wheelchair Mobility    Modified Rankin (Stroke  Patients Only)       Balance                                             Pertinent Vitals/Pain No complaints of pain.  BP prior to ambulation 164/111 mmHg    Home Living Family/patient expects to be discharged to:: Private residence Living Arrangements: Other relatives;Non-relatives/Friends   Type of Home: House Home Access: Stairs to enter Entrance Stairs-Rails: Can reach both Entrance Stairs-Number of Steps: 4 Home Layout: One level        Prior Function Level of Independence: Independent               Hand Dominance        Extremity/Trunk Assessment   Upper Extremity Assessment: Overall WFL for tasks assessed (sensation in tact B UE)           Lower Extremity Assessment: Overall WFL for tasks assessed (sensation in tact B LE; does not report feeling unbalance or differences in strength)      Cervical / Trunk Assessment: Normal  Communication   Communication: No difficulties  Cognition Arousal/Alertness: Awake/alert Behavior During Therapy: WFL for tasks assessed/performed Overall Cognitive Status: Within Functional Limits for tasks assessed                      General Comments      Exercises        Assessment/Plan    PT Assessment  Patent does not need any further PT services  PT Diagnosis     PT Problem List    PT Treatment Interventions     PT Goals (Current goals can be found in the Care Plan section) Acute Rehab PT Goals PT Goal Formulation: No goals set, d/c therapy    Frequency     Barriers to discharge        Co-evaluation               End of Session   Activity Tolerance: Patient tolerated treatment well Patient left: Other (comment) (in wheelchair with technician to transport to MRI)      Functional Assessment Tool Used: clinical judgement Functional Limitation: Mobility: Walking and moving around Mobility: Walking and Moving Around Current Status (G8978): At least 1 percent but  less than 20 percent imp([id number], limited or restricted Mobility: Walking and Moving Around Goal Status [phone](G8979): 0 percent impaired, limited or restricted Mobility: Walking and Moving Around Discharge Status [phone](G8980): 0 percent impaired, limited or restricted    Time: 1000-1011 PT Time Calculation (min): 11 min   Charges:   PT Evaluation $Initial PT Evaluation Tier I: 1 Procedure     PT G Codes:   Functional Assessment Tool Used: clinical judgement Functional Limitation: Mobility: Walking and moving around    Constellation EnergyLaura Juelz Whittenberg 09/25/2013, 12:29 PM Bufford LopeLaura Shanece Cochrane SPT 09/25/2013

## 2013-09-25 NOTE — Progress Notes (Signed)
OT Cancellation Note  Patient Details Name: Jamie Stewart MRN: [id number] DOB: 01-21-1961   Cancelled Treatment:    Reason Eval/Treat Not Completed: OT screened, no needs identified, will sign off. Per pt, he has been up to the bathroom and washed himself up and groomed without difficulty and nursing confirms he has been doing self care on his own. Will sign off for OT.  [name] [phone] 09/25/2013, 10:46 AM

## 2013-09-25 NOTE — Evaluation (Signed)
This entire session was guided, instructed, and directly supervised by Tamala SerKatherine Tafari Humiston, PT, DPT.  Zenovia JarredKati Geral Coker, PT, DPT 09/25/2013 Pager: [phone]

## 2013-09-25 NOTE — Progress Notes (Signed)
Addendum: MRI abnormal with large area of encephalomalacia, chronic hemorrhage D/w Dr.Camillo and requested Neuro consult  Zannie CovePreetha Mosiah Bastin, MD [phone]
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
## 2013-09-25 NOTE — Progress Notes (Signed)
Echocardiogram 2D Echocardiogram has been performed.  Genene ChurnJames M Maahir Horst 09/25/2013, 2:27 PM

## 2013-09-26 ENCOUNTER — Observation Stay (HOSPITAL_COMMUNITY): Payer: Managed Care, Other (non HMO)

## 2013-09-26 ENCOUNTER — Other Ambulatory Visit (HOSPITAL_COMMUNITY): Payer: Managed Care, Other (non HMO)

## 2013-09-26 ENCOUNTER — Observation Stay (HOSPITAL_COMMUNITY)
Admit: 2013-09-26 | Discharge: 2013-09-26 | Disposition: A | Discharge status: Home / Self Care | Payer: Managed Care, Other (non HMO) | Attending: Physician Assistant | Admitting: Physician Assistant

## 2013-09-26 ENCOUNTER — Observation Stay (HOSPITAL_COMMUNITY): Admit: 2013-09-26 | Payer: Managed Care, Other (non HMO)

## 2013-09-26 ENCOUNTER — Ambulatory Visit (HOSPITAL_COMMUNITY): Admit: 2013-09-26 | Payer: Managed Care, Other (non HMO)

## 2013-09-26 DIAGNOSIS — R079 Chest pain, unspecified: Secondary | ICD-10-CM

## 2013-09-26 DIAGNOSIS — G9340 Encephalopathy, unspecified: Secondary | ICD-10-CM | POA: Diagnosis present

## 2013-09-26 LAB — GLUCOSE, CAPILLARY
GLUCOSE-CAPILLARY: 102 mg/dL — AB (ref 70–99)
GLUCOSE-CAPILLARY: 132 mg/dL — AB (ref 70–99)
GLUCOSE-CAPILLARY: 103 mg/dL — AB (ref 70–99)
GLUCOSE-CAPILLARY: 81 mg/dL (ref 70–99)
Glucose-Capillary: 122 mg/dL — ABNORMAL HIGH (ref 70–99)
Glucose-Capillary: 93 mg/dL (ref 70–99)

## 2013-09-26 MED ORDER — REGADENOSON 0.4 MG/5ML IV SOLN
0.4000 mg | Freq: Once | INTRAVENOUS | Status: DC
  Filled 2013-09-26: qty 5

## 2013-09-26 MED ORDER — TECHNETIUM TC 99M SESTAMIBI - CARDIOLITE
30.0000 | Freq: Once | INTRAVENOUS | Status: AC | PRN
  Administered 2013-09-26: 30 via INTRAVENOUS

## 2013-09-26 MED ORDER — REGADENOSON 0.4 MG/5ML IV SOLN
0.4000 mg | Freq: Once | INTRAVENOUS | Status: AC
  Administered 2013-09-26: 0.4 mg via INTRAVENOUS

## 2013-09-26 MED ORDER — SODIUM CHLORIDE 0.9 % IJ SOLN
80.0000 mg | INTRAVENOUS | Status: AC
  Filled 2013-09-26: qty 3.2

## 2013-09-26 MED ORDER — TECHNETIUM TC 99M SESTAMIBI - CARDIOLITE
10.0000 | Freq: Once | INTRAVENOUS | Status: AC | PRN
  Administered 2013-09-26: 09:00:00 10 via INTRAVENOUS

## 2013-09-26 NOTE — Progress Notes (Signed)
Pt left at this time with [facility] headed to [facility] for a Myoview.  Pt alert, oriented and without c/o.

## 2013-09-26 NOTE — Progress Notes (Signed)
[facility] PROGRESS NOTE  Jamie PinchRichard C Stewart ZOX:[id number] DOB: [birth year] DOA: 09/24/2013 PCP: Oliver BarreJames John, MD  Assessment/Plan:  1-chest pain:  -resolved likely due to [facility] -cardiac enzymes negative, EKG with TWI in lateral leads no old EKG for comparison -Cards following for Myoview today -continue  ASA, Coreg, statin -EF of 50-55%  2-accelerated HTN: with HA's and mild intermittent encephalopathy.  -will continue avapro,  coreg BID and use PRN hydralazine  -low sodium diet  -BP better today  3-Mental status change/impaired concentration and difficulty focusing last week, could be related to BP -MRI brain with profound encephalomalacia and chronic hemorrhage likely associated with prior CVA/trauma -appreciate Neuro input -EEG ordered  4.HYPERLIPIDEMIA: continue lovaza; now on zocor, LDL 116   5-Impaired glucose tolerance: -HbA1C 5.7  6-hx of CVA (cerebral infarction): no residual deficit.  -continue ASA for secondary prevention    DVT: heparin   Code Status: Full  Family Communication: brother at bedside Dispo: pending above workup   Consultants:  Cards  HPI/Subjective: Feels better, no further CP, mentation back to baseline  Objective: Filed Vitals:   09/26/13 1032  BP: 158/99  Pulse: 85  Temp:   Resp:     Intake/Output Summary (Last 24 hours) at 09/26/13 1148 Last data filed at 09/25/13 1300  Gross per 24 hour  Intake    120 ml  Output      0 ml  Net    120 ml   Filed Weights   09/25/13 0049  Weight: 95.7 kg (210 lb 15.7 oz)    Exam:   General:  AAOx3  Cardiovascular: S1S2/RRR  Respiratory: CTAB  Abdomen: soft, Nt, BS present  Musculoskeletal: no edema c/c  NEuro: non focal   Data Reviewed: Basic Metabolic Panel:  Recent Labs Lab 09/24/13 2042 09/25/13 0203  NA 137 134*  K 3.9 3.3*  CL 98 96  CO2 25 25  GLUCOSE 127* 93  BUN 12 11  CREATININE 0.73 0.72  CALCIUM 9.3 9.0   Liver Function  Tests:  Recent Labs Lab 09/24/13 2042  AST 24  ALT 12  ALKPHOS 62  BILITOT 0.4  PROT 7.8  ALBUMIN 4.0   No results found for this basename: LIPASE, AMYLASE,  in the last 168 hours No results found for this basename: AMMONIA,  in the last 168 hours CBC:  Recent Labs Lab 09/24/13 2042 09/25/13 0203  WBC 4.7 4.0  NEUTROABS 2.3  --   HGB 13.7 13.8  HCT 39.2 40.3  MCV 93.6 93.3  PLT 188 191   Cardiac Enzymes:  Recent Labs Lab 09/25/13 0203 09/25/13 0647 09/25/13 1240  TROPONINI <0.30 <0.30 <0.30   BNP (last 3 results) No results found for this basename: PROBNP,  in the last 8760 hours CBG:  Recent Labs Lab 09/25/13 0727 09/25/13 1122 09/25/13 1733 09/25/13 2200 09/26/13 0737  GLUCAP 105* 141* 122* 93 102*    No results found for this or any previous visit (from the past 240 hour(s)).   Studies: Dg Chest 2 View  09/24/2013   CLINICAL DATA:  Disorientation, headaches, and Mid chest pain for 2 days.  EXAM: CHEST  2 VIEW  COMPARISON:  CT ABD/PELV WO CM dated 12/02/2011; DG RIBS UNILATERAL W/CHEST*L* dated 10/25/2008  FINDINGS: The heart size and mediastinal contours are within normal limits. Both lungs are clear. The visualized skeletal structures are unremarkable.  IMPRESSION: No active cardiopulmonary disease.   Electronically Signed   By: Burman NievesWilliam  Stevens M.D.   On: 09/24/2013  22:42   Ct Head Wo Contrast  09/24/2013   CLINICAL DATA:  Hypertension.  Altered mental status.  EXAM: CT HEAD WITHOUT CONTRAST  TECHNIQUE: Contiguous axial images were obtained from the base of the skull through the vertex without intravenous contrast.  COMPARISON:  None.  FINDINGS: Mild diffuse cerebral atrophy. Mild ventricular dilatation is likely due to central atrophy. There is focal low-attenuation change in the white matter of the right inferior frontal lobe and anterior temporal region. This probably represents old infarct but white matter edema due to a focal lesion is not excluded and  MRI is recommended for correlation. No other significant white matter disease. No mass effect or midline shift. No abnormal extra-axial fluid collections. Gray-white matter junctions are distinct. Basal cisterns are not effaced. No evidence of acute intracranial hemorrhage. Calvarium appears intact. Opacification of some of the ethmoid air cells and mucosal thickening in the maxillary antra.  IMPRESSION: Nonspecific low attenuation in the right inferior frontal/anterior temporal white matter. Changes likely represent old ischemia but occult mass lesion not excluded and MRI is recommended. No acute intracranial hemorrhage.   Electronically Signed   By: Burman NievesWilliam  Stevens M.D.   On: 09/24/2013 22:56   Mr Brain Wo Contrast  09/25/2013   CLINICAL DATA:  Elevated blood pressure.  Altered mental status.  EXAM: MRI HEAD WITHOUT CONTRAST  TECHNIQUE: Multiplanar, multiecho pulse sequences of the brain and surrounding structures were obtained without intravenous contrast.  COMPARISON:  09/24/2013 CT head.  FINDINGS: No evidence for acute infarction, hemorrhage, mass lesion, or extra-axial fluid. Mild to moderate premature for age cerebral and cerebellar atrophy. 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 Chronic microvascular ischemic change is noted in the periventricular and subcortical white matter.  There is a moderately large area of encephalomalacia in the right inferior frontal lobe and right temporal lobe which could be related to a remote hemorrhagic infarction, intracerebral hemorrhage, or old trauma, given the susceptibility artifact associated with encephalomalacia, and chronic hemorrhage in the adjacent external capsule and putamen.  Chronic sinusitis. Nasal polyps. Flow voids are maintained, although there is moderate dolichoectasia. Negative osseous structures. No midline abnormality.  Compared with recent CT head, there is good general agreement.  IMPRESSION: Moderately large area of  encephalomalacia right inferior frontal lobe and right anterior temporal lobe associated with chronic hemorrhage both surrounding and adjacent to the areas of brain substance loss representing either chronic intracerebral hematoma, hemorrhagic infarction, or remote trauma.  No acute intracranial findings.   Electronically Signed   By: Davonna BellingJohn  Curnes M.D.   On: 09/25/2013 13:20    Scheduled Meds: . aminophylline 80 mg in ns 20 mL (4 mg/mL) NUC MED syringe  80 mg Intravenous to XRAY  . aspirin  325 mg Oral Daily  . carvedilol  25 mg Oral BID WC  . heparin  5,000 Units Subcutaneous 3 times per day  . insulin aspart  0-9 Units Subcutaneous TID WC  . irbesartan  300 mg Oral Daily  . omega-3 acid ethyl esters  1 g Oral Daily  . pantoprazole  40 mg Oral Q1200  . regadenoson  0.4 mg Intravenous Once  . simvastatin  20 mg Oral q1800  . sodium chloride  3 mL Intravenous Q12H  . sodium chloride  3 mL Intravenous Q12H  . vitamin B-12  1,000 mcg Oral Daily  . vitamin C  1,000 mg Oral Daily   Continuous Infusions:  Antibiotics Given (last 72 hours)   None      Principal  Problem:   Chest pain Active Problems:   HYPERLIPIDEMIA   DEPRESSION   Impaired glucose tolerance   Hypertension   CVA (cerebral infarction)   Acute encephalopathy   Heart murmur    Time spent:    Zannie Cove  [facility] Pager [phone]. If 7PM-7AM, please contact night-coverage at [email or web address], password [facility] 09/26/2013, 11:48 AM  LOS: 2 days

## 2013-09-26 NOTE — Progress Notes (Signed)
Pt returned from [facility] via [facility] at this time. Alert, oriented, and without c/o.

## 2013-09-26 NOTE — Progress Notes (Signed)
Patient Name: Jamie Stewart Date of Encounter: 09/26/2013  Principal Problem:   Chest pain Active Problems:   HYPERLIPIDEMIA   DEPRESSION   Impaired glucose tolerance   Hypertension   CVA (cerebral infarction)   Acute encephalopathy   Heart murmur   Length of Stay: 2  SUBJECTIVE  No chest pain or SOB.  CURRENT MEDS . aspirin  325 mg Oral Daily  . carvedilol  25 mg Oral BID WC  . heparin  5,000 Units Subcutaneous 3 times per day  . insulin aspart  0-9 Units Subcutaneous TID WC  . irbesartan  300 mg Oral Daily  . omega-3 acid ethyl esters  1 g Oral Daily  . pantoprazole  40 mg Oral Q1200  . simvastatin  20 mg Oral q1800  . sodium chloride  3 mL Intravenous Q12H  . sodium chloride  3 mL Intravenous Q12H  . vitamin B-12  1,000 mcg Oral Daily  . vitamin C  1,000 mg Oral Daily    OBJECTIVE  Filed Vitals:   09/25/13 0057 09/25/13 0512 09/25/13 1439 09/26/13 0608  BP: 168/101 154/102 169/103 138/95  Pulse: 79 78 81 63  Temp:  98 F (36.7 C) 97.7 F (36.5 C) 97.4 F (36.3 C)  TempSrc:  Oral Oral Oral  Resp:  18 20 18   Height:      Weight:      SpO2:  96% 97% 95%    Intake/Output Summary (Last 24 hours) at 09/26/13 0825 Last data filed at 09/25/13 1300  Gross per 24 hour  Intake    120 ml  Output      0 ml  Net    120 ml   Filed Weights   09/25/13 0049  Weight: 210 lb 15.7 oz (95.7 kg)    PHYSICAL EXAM  General: Pleasant, NAD. Neuro: Alert and oriented X 3. Moves all extremities spontaneously. Psych: Normal affect. HEENT:  Normal  Neck: Supple without bruits or JVD. Lungs:  Resp regular and unlabored, CTA. Heart: RRR no s3, s4, or murmurs. Abdomen: Soft, non-tender, non-distended, BS + x 4.  Extremities: No clubbing, cyanosis or edema. DP/PT/Radials 2+ and equal bilaterally.  Accessory Clinical Findings  CBC  Recent Labs  09/24/13 2042 09/25/13 0203  WBC 4.7 4.0  NEUTROABS 2.3  --   HGB 13.7 13.8  HCT 39.2 40.3  MCV 93.6 93.3  PLT  188 191   Basic Metabolic Panel  Recent Labs  09/24/13 2042 09/25/13 0203  NA 137 134*  K 3.9 3.3*  CL 98 96  CO2 25 25  GLUCOSE 127* 93  BUN 12 11  CREATININE 0.73 0.72  CALCIUM 9.3 9.0   Liver Function Tests  Recent Labs  09/24/13 2042  AST 24  ALT 12  ALKPHOS 62  BILITOT 0.4  PROT 7.8  ALBUMIN 4.0   Cardiac Enzymes  Recent Labs  09/25/13 0203 09/25/13 0647 09/25/13 1240  TROPONINI <0.30 <0.30 <0.30    Recent Labs  09/25/13 0203  HGBA1C 5.7*   Fasting Lipid Panel  Recent Labs  09/25/13 0203  CHOL 212*  HDL 82  LDLCALC 116*  TRIG 68  CHOLHDL 2.6   Thyroid Function Tests  Recent Labs  09/25/13 0203  TSH 3.760    Radiology/Studies  Dg Chest 2 View  09/24/2013   CLINICAL DATA:  Disorientation, headaches, and Mid chest pain for 2 days.  EXAM: CHEST  2 VIEW  COMPARISON:  CT ABD/PELV WO CM dated 12/02/2011; DG RIBS  UNILATERAL W/CHEST*L* dated 10/25/2008  FINDINGS: The heart size and mediastinal contours are within normal limits. Both lungs are clear. The visualized skeletal structures are unremarkable.  IMPRESSION: No active cardiopulmonary disease.   Electronically Signed   By: Burman NievesWilliam  Stevens M.D.   On: 09/24/2013 22:42   Ct Head Wo Contrast  09/24/2013   CLINICAL DATA:  Hypertension.  Altered mental status.  EXAM: CT HEAD WITHOUT CONTRAST  TECHNIQUE: Contiguous axial images were obtained from the base of the skull through the vertex without intravenous contrast.  COMPARISON:  None.  FINDINGS: Mild diffuse cerebral atrophy. Mild ventricular dilatation is likely due to central atrophy. There is focal low-attenuation change in the white matter of the right inferior frontal lobe and anterior temporal region. This probably represents old infarct but white matter edema due to a focal lesion is not excluded and MRI is recommended for correlation. No other significant white matter disease. No mass effect or midline shift. No abnormal extra-axial fluid  collections. Gray-white matter junctions are distinct. Basal cisterns are not effaced. No evidence of acute intracranial hemorrhage. Calvarium appears intact. Opacification of some of the ethmoid air cells and mucosal thickening in the maxillary antra.  IMPRESSION: Nonspecific low attenuation in the right inferior frontal/anterior temporal white matter. Changes likely represent old ischemia but occult mass lesion not excluded and MRI is recommended. No acute intracranial hemorrhage.   Electronically Signed   By: Burman NievesWilliam  Stevens M.D.   On: 09/24/2013 22:56   Mr Brain Wo Contrast  09/25/2013   CLINICAL DATA:  Elevated blood pressure.  Altered mental status.  IMPRESSION: Moderately large area of encephalomalacia right inferior frontal lobe and right anterior temporal lobe associated with chronic hemorrhage both surrounding and adjacent to the areas of brain substance loss representing either chronic intracerebral hematoma, hemorrhagic infarction, or remote trauma.  No acute intracranial findings.   Electronically Signed   By: Davonna BellingJohn  Curnes M.D.   On: 09/25/2013 13:20   TELE: SR    ASSESSMENT AND PLAN  A very pleasant 53 year old gentleman with h/o hypertension who presented with accelerated hypertension with hypertensive emergency - hypertensive encephalopathy and chest pain. MRI brain showed moderately large area of encephalomalacia right inferior frontal lobe and right anterior temporal lobe associated with chronic hemorrhage both surrounding and adjacent to the areas of brain substance loss representing either chronic intracerebral hematoma.  Coreg 25 mg po BID was added to the regimen of irbesartan 300 mg po daily. His BP is still elevated. Chest pain is on and off. Encephalopathy has improved.  Troponin negative x 3. ECG shows SR, LVH and negative T waves in V2-5, previously in V2-3. Part of it might be attributed to repolarization changes with LVH, however suspicious for ischemia.  BP improved  after adding Imdur 30 mg po daily, if still needed a hydralazine 25 mg po TID might be added.  Echo shows moderate concentric LVH with impaired relaxation and normal filling pressures. If stress test negative, he can be discharged and we will follow him in our clinic.  Signed, Lars MassonKatarina H Evan Mackie MD, [facility] 09/26/2013

## 2013-09-27 DIAGNOSIS — G9389 Other specified disorders of brain: Secondary | ICD-10-CM | POA: Diagnosis present

## 2013-09-27 LAB — GLUCOSE, CAPILLARY
Glucose-Capillary: 104 mg/dL — ABNORMAL HIGH (ref 70–99)
Glucose-Capillary: 115 mg/dL — ABNORMAL HIGH (ref 70–99)

## 2013-09-27 MED ORDER — ISOSORBIDE MONONITRATE ER 30 MG PO TB24: 30.0000 mg | ORAL_TABLET | Freq: Every day | ORAL | Status: DC

## 2013-09-27 MED ORDER — CARVEDILOL 25 MG PO TABS: 25.0000 mg | ORAL_TABLET | Freq: Two times a day (BID) | ORAL | Status: DC

## 2013-09-27 MED ORDER — ISOSORBIDE MONONITRATE ER 30 MG PO TB24
30.0000 mg | ORAL_TABLET | Freq: Every day | ORAL | Status: DC
  Administered 2013-09-27: 30 mg via ORAL
  Filled 2013-09-27: qty 1

## 2013-09-27 MED ORDER — SIMVASTATIN 20 MG PO TABS: 20.0000 mg | ORAL_TABLET | Freq: Every day | ORAL | Status: DC

## 2013-09-27 NOTE — Progress Notes (Signed)
Pt leaving at this time with his brother.  Discharge instructions/prescriptions given/explained with pt verbalizing understanding. Pt alert, oriented, and without c/o. Ambulating and with glasses on. Followup appointments noted.

## 2013-09-27 NOTE — Progress Notes (Signed)
Patient Name: Jamie Stewart Date of Encounter: 09/27/2013  Principal Problem:   Chest pain Active Problems:   HYPERLIPIDEMIA   DEPRESSION   Impaired glucose tolerance   Hypertension   CVA (cerebral infarction)   Acute encephalopathy   Heart murmur   Encephalopathy acute   Length of Stay: 3  SUBJECTIVE  No chest pain or SOB.  CURRENT MEDS . aminophylline 80 mg in ns 20 mL (4 mg/mL) NUC MED syringe  80 mg Intravenous to XRAY  . aspirin  325 mg Oral Daily  . carvedilol  25 mg Oral BID WC  . heparin  5,000 Units Subcutaneous 3 times per day  . insulin aspart  0-9 Units Subcutaneous TID WC  . irbesartan  300 mg Oral Daily  . omega-3 acid ethyl esters  1 g Oral Daily  . pantoprazole  40 mg Oral Q1200  . regadenoson  0.4 mg Intravenous Once  . simvastatin  20 mg Oral q1800  . sodium chloride  3 mL Intravenous Q12H  . sodium chloride  3 mL Intravenous Q12H  . vitamin B-12  1,000 mcg Oral Daily  . vitamin C  1,000 mg Oral Daily    OBJECTIVE  Filed Vitals:   09/26/13 1032 09/26/13 1414 09/26/13 2215 09/27/13 0501  BP: 158/99 149/95 150/88 154/92  Pulse: 85 76 75 66  Temp:  98.1 F (36.7 C) 98.3 F (36.8 C) 97.6 F (36.4 C)  TempSrc:  Oral Oral Oral  Resp:  18 18 18   Height:      Weight:      SpO2:  98% 96% 100%    Intake/Output Summary (Last 24 hours) at 09/27/13 0733 Last data filed at 09/27/13 0700  Gross per 24 hour  Intake    360 ml  Output      0 ml  Net    360 ml   Filed Weights   09/25/13 0049  Weight: 210 lb 15.7 oz (95.7 kg)    PHYSICAL EXAM  General: Pleasant, NAD. Neuro: Alert and oriented X 3. Moves all extremities spontaneously. Psych: Normal affect. HEENT:  Normal  Neck: Supple without bruits or JVD. Lungs:  Resp regular and unlabored, CTA. Heart: RRR no s3, s4, or murmurs. Abdomen: Soft, non-tender, non-distended, BS + x 4.  Extremities: No clubbing, cyanosis or edema. DP/PT/Radials 2+ and equal bilaterally.  Accessory  Clinical Findings  CBC  Recent Labs  09/24/13 2042 09/25/13 0203  WBC 4.7 4.0  NEUTROABS 2.3  --   HGB 13.7 13.8  HCT 39.2 40.3  MCV 93.6 93.3  PLT 188 191   Basic Metabolic Panel  Recent Labs  09/24/13 2042 09/25/13 0203  NA 137 134*  K 3.9 3.3*  CL 98 96  CO2 25 25  GLUCOSE 127* 93  BUN 12 11  CREATININE 0.73 0.72  CALCIUM 9.3 9.0   Liver Function Tests  Recent Labs  09/24/13 2042  AST 24  ALT 12  ALKPHOS 62  BILITOT 0.4  PROT 7.8  ALBUMIN 4.0   Cardiac Enzymes  Recent Labs  09/25/13 0203 09/25/13 0647 09/25/13 1240  TROPONINI <0.30 <0.30 <0.30    Recent Labs  09/25/13 0203  HGBA1C 5.7*   Fasting Lipid Panel  Recent Labs  09/25/13 0203  CHOL 212*  HDL 82  LDLCALC 116*  TRIG 68  CHOLHDL 2.6   Thyroid Function Tests  Recent Labs  09/25/13 0203  TSH 3.760    Radiology/Studies  Dg Chest 2 View  09/24/2013   CLINICAL DATA:  Disorientation, headaches, and Mid chest pain for 2 days.  EXAM: CHEST  2 VIEW  COMPARISON:  CT ABD/PELV WO CM dated 12/02/2011; DG RIBS UNILATERAL W/CHEST*L* dated 10/25/2008  FINDINGS: The heart size and mediastinal contours are within normal limits. Both lungs are clear. The visualized skeletal structures are unremarkable.  IMPRESSION: No active cardiopulmonary disease.   Electronically Signed   By: Burman NievesWilliam  Stevens M.D.   On: 09/24/2013 22:42   Mr Brain Wo Contrast  09/25/2013   CLINICAL DATA:  Elevated blood pressure.  Altered mental status.  IMPRESSION: Moderately large area of encephalomalacia right inferior frontal lobe and right anterior temporal lobe associated with chronic hemorrhage both surrounding and adjacent to the areas of brain substance loss representing either chronic intracerebral hematoma, hemorrhagic infarction, or remote trauma.  No acute intracranial findings.   Electronically Signed   By: Davonna BellingJohn  Curnes M.D.   On: 09/25/2013 13:20   TELE: SR    ASSESSMENT AND PLAN  A very pleasant 53 year  old gentleman with h/o hypertension who presented with accelerated hypertension with hypertensive emergency - hypertensive encephalopathy and chest pain. MRI brain showed moderately large area of encephalomalacia right inferior frontal lobe and right anterior temporal lobe associated with chronic hemorrhage both surrounding and adjacent to the areas of brain substance loss representing either chronic intracerebral hematoma.  Coreg 25 mg po BID was added to the regimen of irbesartan 300 mg po daily. His BP is still elevated. Chest pain is on and off. Encephalopathy has improved.  Troponin negative x 3. ECG shows SR, LVH and negative T waves in V2-5, previously in V2-3. Part of it might be attributed to repolarization changes with LVH, however suspicious for ischemia.   BP improved after adding Carvedilol, however still elevated. We would recommend to add Imdur 30 mg po daily, if still needed a hydralazine 25 mg po TID might be added.  Echo shows moderate concentric LVH with impaired relaxation and normal filling pressures. The stress test shows no prior scar and no ischemia, he can be discharged from cardiology standpoint, we will follow him in our clinic.  Signed, Lars MassonKatarina H Precious Gilchrest MD, [facility] 09/27/2013

## 2013-10-05 ENCOUNTER — Other Ambulatory Visit: Payer: Managed Care, Other (non HMO)

## 2013-10-06 ENCOUNTER — Encounter: Payer: Self-pay | Admitting: Internal Medicine

## 2013-10-06 ENCOUNTER — Other Ambulatory Visit (INDEPENDENT_AMBULATORY_CARE_PROVIDER_SITE_OTHER): Payer: Managed Care, Other (non HMO)

## 2013-10-06 ENCOUNTER — Telehealth: Payer: Self-pay

## 2013-10-06 ENCOUNTER — Ambulatory Visit (INDEPENDENT_AMBULATORY_CARE_PROVIDER_SITE_OTHER): Payer: Managed Care, Other (non HMO) | Admitting: Internal Medicine

## 2013-10-06 VITALS — BP 132/86 | HR 79 | Temp 98.7°F | Ht 69.0 in | Wt 213.1 lb

## 2013-10-06 DIAGNOSIS — Z0000 Encounter for general adult medical examination without abnormal findings: Secondary | ICD-10-CM

## 2013-10-06 LAB — PSA: PSA: 1.28 ng/mL (ref 0.10–4.00)

## 2013-10-06 NOTE — Telephone Encounter (Signed)
Message copied by Pincus SanesEWING, ROBIN B on Tue Oct 06, 2013  3:08 PM ------      Message from: Corwin LevinsJOHN, JAMES W      Created: Tue Oct 06, 2013  3:00 PM      Regarding: lab today       Pt only needs PSA done today, as has had all other labs recent - to call lab please, I have done order ------

## 2013-10-06 NOTE — Progress Notes (Signed)
Pre visit review using our clinic review tool, if applicable. No additional management support is needed unless otherwise documented below in the visit note. 

## 2013-10-06 NOTE — Telephone Encounter (Signed)
Lab informed.

## 2013-10-06 NOTE — Patient Instructions (Signed)
Please continue all other medications as before, and refills have been done if requested. Please have the pharmacy call with any other refills you may need.  Please continue your efforts at being more active, low cholesterol diet, and weight control.  You are otherwise up to date with prevention measures today.  Your PSA lab test will be ordered on the blood drawn yesterday  You will be contacted by phone if any changes need to be made immediately.  Otherwise, you will receive a letter about your results with an explanation, but please check with MyChart first.  Please keep your appointments with your specialists as you have planned - cardiology, and neurology  Please return in 6 months, or sooner if needed

## 2013-10-06 NOTE — Assessment & Plan Note (Addendum)
Overall doing well, age appropriate education and counseling updated, referrals for preventative services and immunizations addressed, dietary and smoking counseling addressed, most recent labs reviewed.  I have personally reviewed and have noted: 1) the patient's medical and social history 2) The pt's use of alcohol, tobacco, and illicit drugs 3) The patient's current medications and supplements 4) Functional ability including ADL's, fall risk, home safety risk, hearing and visual impairment 5) Diet and physical activities 6) Evidence for depression or mood disorder 7) The patient's height, weight, and BMI have been recorded in the chart I have made referrals, and provided counseling and education based on review of the above For PSA as he is due, lab to be notified re blood he had drawn yesterday Also for colonoscopy screen
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
## 2013-10-07 NOTE — Discharge Summary (Signed)
Physician Discharge Summary  Jamie PinchRichard C Stewart JXB:[id number] DOB: 1960-12-18 DOA: 09/24/2013  PCP: Oliver BarreJames John, MD  Admit date: 09/24/2013 Discharge date: 09/27/2013  Time spent: 45 minutes  Recommendations for Outpatient Follow-up:  1. PCP in 1 week 2. [facility] in 1 month, consider EEG  Discharge Diagnoses:    Atypical chest pain   HYPERLIPIDEMIA   DEPRESSION   Impaired glucose tolerance   CVA (cerebral infarction)   Heart murmur   Encephalopathy acute   Encephalomalacia on imaging study   Discharge Condition: heart healthy Diet recommendation: low sodium  Filed Weights   09/25/13 0049  Weight: 95.7 kg (210 lb 15.7 oz)    History of present illness:  Jamie Stewart is a 53 y.o. male medical history significant for depression, hypertension, glucose intolerance, hyperlipidemia anxiety; came to the hospital secondary to intermittent episode of chest pain and elevated blood pressure. Patient reports having a lot of stress at work lately and has noticed his blood pressure to be higher than usual. Patient also endorses episodes of mental cloudiness intermittently when he's not really able to perform even simple task, has associated forgetfulness he said. On day of admission his brother was at home with him and noticed this change in his metal status and at that moment patient also endorses some chest tightness feeling; brother took BP and was 190/109. Brother ask patient to come to ED for further evaluation and treatment. Patient endorses mild intermittent Gaylyn RongHa recently too. He denies fever, chills, nausea/vomiting, shortness of breath, abdominal pain, melena, hematochezia, dysuria, diarrhea or any other acute complaints. In the ED a CT scan of the head demonstrated no acute hemorrhagic abnormalities but it show low attenuation in the right inferior frontal/anterior temporal white matter (changes likely representing old ischemia); nonspecific abnormalities on EKG (but not all EKGs  tracing for comparison); elevated glucose level (127) and accelerated HTN. [facility] has been called to admit the patient to rule out ACS and provide further evaluation and treatment   Hospital Course:  1-chest pain:  -resolved likely due to [facility]  -cardiac enzymes negative, EKG with TWI in lateral leads no old EKG for comparison  -Cards was consulted, underwent Myoview which was negative for inducible ischemia  -continue ASA, Coreg, statin  -EF of 50-55%   2-accelerated HTN: with HA's and mild intermittent encephalopathy.  -will continue avapro, started on coreg BID and imdur and used PRN hydralazine IV -low sodium diet  -BP improved  3-Mental status change/impaired concentration and difficulty focusing last week, could be related to BP  -MRI brain with profound encephalomalacia and chronic hemorrhage likely associated with prior CVA/trauma  -appreciate Neuro input  -EEG ordered but pending, could be done as outpatient, unlikely to change management per Neuro, could not be done inpatient since it was the weekend  4.HYPERLIPIDEMIA: continue lovaza; now on zocor, LDL 116   5-Impaired glucose tolerance:  -HbA1C 5.7   6-hx of CVA (cerebral infarction): no residual deficit.  -continue ASA for secondary prevention    Procedures:  Myoview  Consultations: Cards Neurology Dr. Cyril Mourningamillo  Discharge Exam: Filed Vitals:   09/27/13 0501  BP: 154/92  Pulse: 66  Temp: 97.6 F (36.4 C)  Resp: 18    General: AAOx3 Cardiovascular: S1S2/RRR Respiratory: CTAB  Discharge Instructions You were cared for by a hospitalist during your hospital stay. If you have any questions about your discharge medications or the care you received while you were in the hospital after you are discharged, you can call  the unit and asked to speak with the hospitalist on call if the hospitalist that took care of you is not available. Once you are discharged, your primary care physician will  handle any further medical issues. Please note that NO REFILLS for any discharge medications will be authorized once you are discharged, as it is imperative that you return to your primary care physician (or establish a relationship with a primary care physician if you do not have one) for your aftercare needs so that they can reassess your need for medications and monitor your lab values.  Discharge Orders   Future Appointments Provider Department Dept Phone   10/14/2013 8:00 AM Lars Masson, MD [facility] [phone]   10/22/2013 8:00 AM Cira Servant, DO [facility] [place] [phone]   04/09/2014 9:15 AM Corwin Levins, MD [facility] [phone]   Future Orders Complete By Expires   Diet - low sodium heart healthy  As directed    Increase activity slowly  As directed        Medication List         aspirin 81 MG chewable tablet  Chew 81 mg by mouth daily.     carvedilol 25 MG tablet  Commonly known as:  COREG  Take 1 tablet (25 mg total) by mouth 2 (two) times daily with a meal.     irbesartan 300 MG tablet  Commonly known as:  AVAPRO  TAKE 1 TABLET BY MOUTH ONCE DAILY     isosorbide mononitrate 30 MG 24 hr tablet  Commonly known as:  IMDUR  Take 1 tablet (30 mg total) by mouth daily.     omega-3 acid ethyl esters 1 G capsule  Commonly known as:  LOVAZA  Take 1 g by mouth daily.     simvastatin 20 MG tablet  Commonly known as:  ZOCOR  Take 1 tablet (20 mg total) by mouth daily at 6 PM.     vitamin B-12 1000 MCG tablet  Commonly known as:  CYANOCOBALAMIN  Take 1,000 mcg by mouth daily.     vitamin C 500 MG tablet  Commonly known as:  ASCORBIC ACID  Take 1,000 mg by mouth daily.       No Known Allergies     Follow-up Information   Follow up with Lars Masson, MD. Schedule an appointment as soon as possible for a visit in 2 weeks.   Specialty:  Cardiology   Contact information:   [address] [place] [address] [phone]       Follow up with TAT, REBECCA, DO. Schedule an appointment as soon as possible for a visit in 2 weeks. (Neurology)    Specialty:  Neurology   Contact information:   [address] [place]  [address] [place] [address] [phone]        The results of significant diagnostics from this hospitalization (including imaging, microbiology, ancillary and laboratory) are listed below for reference.    Significant Diagnostic Studies: Dg Chest 2 View  09/24/2013   CLINICAL DATA:  Disorientation, headaches, and Mid chest pain for 2 days.  EXAM: CHEST  2 VIEW  COMPARISON:  CT ABD/PELV WO CM dated 12/02/2011; DG RIBS UNILATERAL W/CHEST*L* dated 10/25/2008  FINDINGS: The heart size and mediastinal contours are within normal limits. Both lungs are clear. The visualized skeletal structures are unremarkable.  IMPRESSION: No active cardiopulmonary disease.   Electronically Signed   By: Chrissie Noa  Andria MeuseStevens M.D.   On: 09/24/2013 22:42   Ct Head Wo Contrast  09/24/2013   CLINICAL DATA:  Hypertension.  Altered mental status.  EXAM: CT HEAD WITHOUT CONTRAST  TECHNIQUE: Contiguous axial images were obtained from the base of the skull through the vertex without intravenous contrast.  COMPARISON:  None.  FINDINGS: Mild diffuse cerebral atrophy. Mild ventricular dilatation is likely due to central atrophy. There is focal low-attenuation change in the white matter of the right inferior frontal lobe and anterior temporal region. This probably represents old infarct but white matter edema due to a focal lesion is not excluded and MRI is recommended for correlation. No other significant white matter disease. No mass effect or midline shift. No abnormal extra-axial fluid collections. Gray-white matter junctions are distinct. Basal cisterns are not effaced. No evidence of acute intracranial hemorrhage. Calvarium appears intact. Opacification of some of the ethmoid air  cells and mucosal thickening in the maxillary antra.  IMPRESSION: Nonspecific low attenuation in the right inferior frontal/anterior temporal white matter. Changes likely represent old ischemia but occult mass lesion not excluded and MRI is recommended. No acute intracranial hemorrhage.   Electronically Signed   By: Burman NievesWilliam  Stevens M.D.   On: 09/24/2013 22:56   Mr Brain Wo Contrast  09/25/2013   CLINICAL DATA:  Elevated blood pressure.  Altered mental status.  EXAM: MRI HEAD WITHOUT CONTRAST  TECHNIQUE: Multiplanar, multiecho pulse sequences of the brain and surrounding structures were obtained without intravenous contrast.  COMPARISON:  09/24/2013 CT head.  FINDINGS: No evidence for acute infarction, hemorrhage, mass lesion, or extra-axial fluid. Mild to moderate premature for age cerebral and cerebellar atrophy. 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 Chronic microvascular ischemic change is noted in the periventricular and subcortical white matter.  There is a moderately large area of encephalomalacia in the right inferior frontal lobe and right temporal lobe which could be related to a remote hemorrhagic infarction, intracerebral hemorrhage, or old trauma, given the susceptibility artifact associated with encephalomalacia, and chronic hemorrhage in the adjacent external capsule and putamen.  Chronic sinusitis. Nasal polyps. Flow voids are maintained, although there is moderate dolichoectasia. Negative osseous structures. No midline abnormality.  Compared with recent CT head, there is good general agreement.  IMPRESSION: Moderately large area of encephalomalacia right inferior frontal lobe and right anterior temporal lobe associated with chronic hemorrhage both surrounding and adjacent to the areas of brain substance loss representing either chronic intracerebral hematoma, hemorrhagic infarction, or remote trauma.  No acute intracranial findings.   Electronically Signed   By: Davonna BellingJohn  Curnes  M.D.   On: 09/25/2013 13:20   Nm Myocar Multi W/spect W/wall Motion / Ef  09/26/2013   CLINICAL DATA:  53 year old male with chest pain.  EXAM: MYOCARDIAL IMAGING WITH SPECT (REST AND PHARMACOLOGIC-STRESS)  GATED LEFT VENTRICULAR WALL MOTION STUDY  LEFT VENTRICULAR EJECTION FRACTION  TECHNIQUE: Standard myocardial SPECT imaging was performed after resting intravenous injection of 10 mCi Tc-2679m sestamibi. Subsequently, intravenous infusion of Lexiscan was performed under the supervision of the Cardiology staff. At peak effect of the drug, 30 mCi Tc-6779m sestamibi was injected intravenously and standard myocardial SPECT imaging was performed. Quantitative gated imaging was also performed to evaluate left ventricular wall motion, and estimate left ventricular ejection fraction.  COMPARISON:  Chest radiograph 09/24/2013  FINDINGS: Perfusion: No decreased counts within the left ventricle to suggests reversible ischemia or infarction. The left ventricle is dilated on rest and stress.  Wall Motion: No focal wall motion abnormality  Ejection Fraction:  51% %  End-diastolic volume equals: 153 ml  End systolic volume equals: 75 ml  IMPRESSION: 1. No reversible ischemia or infarction.  2.  Normal wall motion.  Left ventricular dilatation.  3. Ejection fraction equals 51 %   Electronically Signed   By: Genevive Bi M.D.   On: 09/26/2013 14:18    Microbiology: No results found for this or any previous visit (from the past 240 hour(s)).   Labs: Basic Metabolic Panel: No results found for this basename: NA, K, CL, CO2, GLUCOSE, BUN, CREATININE, CALCIUM, MG, PHOS,  in the last 168 hours Liver Function Tests: No results found for this basename: AST, ALT, ALKPHOS, BILITOT, PROT, ALBUMIN,  in the last 168 hours No results found for this basename: LIPASE, AMYLASE,  in the last 168 hours No results found for this basename: AMMONIA,  in the last 168 hours CBC: No results found for this basename: WBC, NEUTROABS, HGB,  HCT, MCV, PLT,  in the last 168 hours Cardiac Enzymes: No results found for this basename: CKTOTAL, CKMB, CKMBINDEX, TROPONINI,  in the last 168 hours BNP: BNP (last 3 results) No results found for this basename: PROBNP,  in the last 8760 hours CBG: No results found for this basename: GLUCAP,  in the last 168 hours     Signed:  Zannie Cove  [facility] 10/07/2013, 4:53 PM

## 2013-10-09 ENCOUNTER — Encounter: Payer: Self-pay | Admitting: *Deleted

## 2013-10-14 ENCOUNTER — Encounter: Payer: Self-pay | Admitting: *Deleted

## 2013-10-14 ENCOUNTER — Ambulatory Visit (INDEPENDENT_AMBULATORY_CARE_PROVIDER_SITE_OTHER): Payer: Managed Care, Other (non HMO) | Admitting: Cardiology

## 2013-10-14 ENCOUNTER — Encounter: Payer: Self-pay | Admitting: Cardiology

## 2013-10-14 VITALS — BP 158/104 | HR 82 | Ht 69.0 in | Wt 215.4 lb

## 2013-10-14 DIAGNOSIS — I10 Essential (primary) hypertension: Secondary | ICD-10-CM

## 2013-10-14 MED ORDER — HYDRALAZINE HCL 25 MG PO TABS: 25.0000 mg | ORAL_TABLET | Freq: Three times a day (TID) | ORAL | Status: DC

## 2013-10-14 NOTE — Patient Instructions (Signed)
Your physician has recommended you make the following change in your medication:   START TAKING HYDRALAZINE 25 MG THREE TIMES DAILY  Your physician recommends that you schedule a follow-up appointment in: IN 6 WEEKS WITH DR NELSON  YOUR PHYSICIAN RECOMMENDS YOU SIGN [facility]
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
## 2013-10-22 ENCOUNTER — Encounter: Payer: Self-pay | Admitting: *Deleted

## 2013-10-22 ENCOUNTER — Ambulatory Visit (INDEPENDENT_AMBULATORY_CARE_PROVIDER_SITE_OTHER): Payer: Managed Care, Other (non HMO) | Admitting: Neurology

## 2013-10-22 ENCOUNTER — Encounter: Payer: Self-pay | Admitting: Neurology

## 2013-10-22 VITALS — BP 128/78 | HR 70 | Temp 97.9°F | Resp 16 | Ht 70.0 in | Wt 215.7 lb

## 2013-10-22 DIAGNOSIS — R4189 Other symptoms and signs involving cognitive functions and awareness: Secondary | ICD-10-CM

## 2013-10-22 DIAGNOSIS — I674 Hypertensive encephalopathy: Secondary | ICD-10-CM
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
## 2013-10-22 NOTE — Patient Instructions (Signed)
The findings on the brain MRI are old, related to the bleed you had as a child. 1.  We will get EEG 2.  We will send you for neuropsych testing to evaluate for cognitive impairment. 3.  Follow up after the neuropsych testing.

## 2013-11-03 ENCOUNTER — Other Ambulatory Visit: Payer: Self-pay | Admitting: Internal Medicine

## 2013-11-05 ENCOUNTER — Telehealth: Payer: Self-pay | Admitting: *Deleted

## 2013-11-05 ENCOUNTER — Ambulatory Visit (INDEPENDENT_AMBULATORY_CARE_PROVIDER_SITE_OTHER): Payer: Managed Care, Other (non HMO) | Admitting: Neurology

## 2013-11-05 DIAGNOSIS — I674 Hypertensive encephalopathy: Secondary | ICD-10-CM

## 2013-11-05 DIAGNOSIS — R4189 Other symptoms and signs involving cognitive functions and awareness: Secondary | ICD-10-CM

## 2013-11-05 NOTE — Procedures (Signed)
ELECTROENCEPHALOGRAM REPORT  Date of Study: 11/05/2013  Patient's Name: Jamie Stewart MRN: [id number] Date of Birth: 28-Apr-1961  Referring Provider: Shon Millet, DO  Indication: 53 year old man with memory problems since recent hospitalization for hypertensive emergency.  Medications: ASA 81mg , B12, Imdur, Avapro, Coreg, hydralazine  Technical Summary: This is a multichannel digital EEG recording, using the international 10-20 placement system.  Spike detection software was employed.  Description: The EEG background is symmetric, with a well-developed posterior dominant rhythm of 9 Hz, which is reactive to eye opening and closing.  Diffuse beta activity is seen, with a bilateral frontal preponderance.  No focal or generalized abnormalities are seen.  No focal or generalized epileptiform discharges are seen.  Stage II sleep is seen, with normal and symmetric sleep patterns.  Hyperventilation and photic stimulation were performed, and produced no abnormalities.  ECG revealed normal cardiac rate and rhythm.  Impression: This is a normal routine EEG of the awake and asleep states, with activating procedures.  A normal study does not rule out the possibility of a seizure disorder in this patient.  Adam R. Everlena Cooper, DO

## 2013-11-05 NOTE — Telephone Encounter (Signed)
Message copied by Fredirick Maudlin on Thu Nov 05, 2013 12:04 PM ------      Message from: JAFFE, ADAM R      Created: Thu Nov 05, 2013 11:18 AM       eeg is normal      ----- Message -----         From: Cira Servant, DO         Sent: 11/05/2013  11:13 AM           To: Cira Servant, DO                   ------

## 2013-11-05 NOTE — Telephone Encounter (Signed)
Patient is aware that EEG is normal 

## 2013-11-09 ENCOUNTER — Encounter: Payer: Self-pay | Admitting: Internal Medicine

## 2013-11-16 ENCOUNTER — Other Ambulatory Visit: Payer: Self-pay | Admitting: Internal Medicine

## 2013-11-23 ENCOUNTER — Other Ambulatory Visit: Payer: Self-pay | Admitting: Internal Medicine

## 2013-11-30 ENCOUNTER — Ambulatory Visit: Payer: Managed Care, Other (non HMO) | Admitting: Cardiology

## 2013-12-07 ENCOUNTER — Emergency Department (HOSPITAL_COMMUNITY): Payer: Managed Care, Other (non HMO)

## 2013-12-07 ENCOUNTER — Emergency Department (HOSPITAL_COMMUNITY)
Admission: EM | Admit: 2013-12-07 | Discharge: 2013-12-07 | Disposition: A | Discharge status: Home / Self Care | Payer: Managed Care, Other (non HMO) | Attending: Emergency Medicine | Admitting: Emergency Medicine

## 2013-12-07 ENCOUNTER — Encounter (HOSPITAL_COMMUNITY): Payer: Self-pay | Admitting: Emergency Medicine

## 2013-12-07 DIAGNOSIS — Z8739 Personal history of other diseases of the musculoskeletal system and connective tissue: Secondary | ICD-10-CM | POA: Insufficient documentation

## 2013-12-07 DIAGNOSIS — F411 Generalized anxiety disorder: Secondary | ICD-10-CM | POA: Insufficient documentation

## 2013-12-07 DIAGNOSIS — Z87891 Personal history of nicotine dependence: Secondary | ICD-10-CM | POA: Insufficient documentation

## 2013-12-07 DIAGNOSIS — J40 Bronchitis, not specified as acute or chronic: Secondary | ICD-10-CM

## 2013-12-07 DIAGNOSIS — R0602 Shortness of breath: Secondary | ICD-10-CM | POA: Insufficient documentation

## 2013-12-07 DIAGNOSIS — I10 Essential (primary) hypertension: Secondary | ICD-10-CM | POA: Insufficient documentation

## 2013-12-07 DIAGNOSIS — E785 Hyperlipidemia, unspecified: Secondary | ICD-10-CM | POA: Insufficient documentation

## 2013-12-07 DIAGNOSIS — Z79899 Other long term (current) drug therapy: Secondary | ICD-10-CM | POA: Insufficient documentation

## 2013-12-07 DIAGNOSIS — Z8669 Personal history of other diseases of the nervous system and sense organs: Secondary | ICD-10-CM | POA: Insufficient documentation

## 2013-12-07 DIAGNOSIS — Z87442 Personal history of urinary calculi: Secondary | ICD-10-CM | POA: Insufficient documentation

## 2013-12-07 DIAGNOSIS — R079 Chest pain, unspecified: Secondary | ICD-10-CM | POA: Insufficient documentation

## 2013-12-07 DIAGNOSIS — Z7982 Long term (current) use of aspirin: Secondary | ICD-10-CM | POA: Insufficient documentation

## 2013-12-07 DIAGNOSIS — R61 Generalized hyperhidrosis: Secondary | ICD-10-CM | POA: Insufficient documentation

## 2013-12-07 LAB — COMPREHENSIVE METABOLIC PANEL
ALK PHOS: 47 U/L (ref 39–117)
ALT: 21 U/L (ref 0–53)
AST: 21 U/L (ref 0–37)
Albumin: 3.9 g/dL (ref 3.5–5.2)
Anion gap: 17 — ABNORMAL HIGH (ref 5–15)
BILIRUBIN TOTAL: 0.4 mg/dL (ref 0.3–1.2)
BUN: 16 mg/dL (ref 6–23)
CO2: 22 meq/L (ref 19–32)
CREATININE: 0.76 mg/dL (ref 0.50–1.35)
Calcium: 8.5 mg/dL (ref 8.4–10.5)
Chloride: 100 mEq/L (ref 96–112)
GFR calc Af Amer: >90 mL/min (ref >90)
Glucose, Bld: 110 mg/dL — ABNORMAL HIGH (ref 70–99)
Potassium: 4 mEq/L (ref 3.7–5.3)
Sodium: 139 mEq/L (ref 137–147)
Total Protein: 7.2 g/dL (ref 6.0–8.3)

## 2013-12-07 LAB — CBC WITH DIFFERENTIAL/PLATELET
Basophils Absolute: 0 10*3/uL (ref 0.0–0.1)
Basophils Relative: 1 % (ref 0–1)
Eosinophils Absolute: 0.1 10*3/uL (ref 0.0–0.7)
Eosinophils Relative: 3 % (ref 0–5)
HEMATOCRIT: 38.7 % — AB (ref 39.0–52.0)
HEMOGLOBIN: 12.8 g/dL — AB (ref 13.0–17.0)
Lymphocytes Relative: 21 % (ref 12–46)
Lymphs Abs: 0.9 10*3/uL (ref 0.7–4.0)
MCH: 31.8 pg (ref 26.0–34.0)
MCHC: 33.1 g/dL (ref 30.0–36.0)
MCV: 96 fL (ref 78.0–100.0)
MONO ABS: 0.2 10*3/uL (ref 0.1–1.0)
MONOS PCT: 5 % (ref 3–12)
Neutro Abs: 2.8 10*3/uL (ref 1.7–7.7)
Neutrophils Relative %: 70 % (ref 43–77)
Platelets: 177 10*3/uL (ref 150–400)
RBC: 4.03 MIL/uL — AB (ref 4.22–5.81)
RDW: 15.2 % (ref 11.5–15.5)
WBC: 4.1 10*3/uL (ref 4.0–10.5)

## 2013-12-07 LAB — I-STAT TROPONIN, ED
TROPONIN I, POC: 0.01 ng/mL (ref 0.00–0.08)
Troponin i, poc: 0.02 ng/mL (ref 0.00–0.08)

## 2013-12-07 LAB — D-DIMER, QUANTITATIVE: D-Dimer, Quant: 0.94 ug/mL-FEU — ABNORMAL HIGH (ref 0.00–0.48)

## 2013-12-07 MED ORDER — IPRATROPIUM-ALBUTEROL 0.5-2.5 (3) MG/3ML IN SOLN
3.0000 mL | Freq: Once | RESPIRATORY_TRACT | Status: AC
  Administered 2013-12-07: 3 mL via RESPIRATORY_TRACT
  Filled 2013-12-07: qty 3

## 2013-12-07 MED ORDER — IOHEXOL 350 MG/ML SOLN
100.0000 mL | Freq: Once | INTRAVENOUS | Status: AC | PRN
  Administered 2013-12-07: 100 mL via INTRAVENOUS

## 2013-12-07 MED ORDER — ALBUTEROL SULFATE HFA 108 (90 BASE) MCG/ACT IN AERS
2.0000 | INHALATION_SPRAY | Freq: Once | RESPIRATORY_TRACT | Status: AC
  Administered 2013-12-07: 2 via RESPIRATORY_TRACT
  Filled 2013-12-07: qty 6.7

## 2013-12-07 NOTE — ED Notes (Signed)
Patient transported to CT 

## 2013-12-07 NOTE — ED Notes (Signed)
Patient returned from CT

## 2013-12-07 NOTE — ED Notes (Signed)
Patient returned from X-ray 
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
## 2013-12-07 NOTE — ED Notes (Signed)
Sudden onset of chest pain while walking down the hall at work at 1600; also a headache, denies other symptoms. Given 324mg  asa and 1SL nitro, pain decreased from 8 to 5.

## 2013-12-07 NOTE — Discharge Instructions (Signed)
Take albuterol every 4 hrs as needed for shortness of breath.   Take ASA 81 mg daily.   Follow up with your doctor. You may need a stress test if you still have chest pain or shortness of breath in a week.   Return to ER if you have worse shortness of breath, chest pain.

## 2013-12-11 ENCOUNTER — Ambulatory Visit: Payer: Managed Care, Other (non HMO) | Admitting: Nurse Practitioner

## 2013-12-15 ENCOUNTER — Ambulatory Visit: Payer: Managed Care, Other (non HMO) | Admitting: Internal Medicine

## 2013-12-15 DIAGNOSIS — Z0289 Encounter for other administrative examinations: Secondary | ICD-10-CM

## 2013-12-24 ENCOUNTER — Ambulatory Visit (INDEPENDENT_AMBULATORY_CARE_PROVIDER_SITE_OTHER): Payer: Managed Care, Other (non HMO) | Admitting: Internal Medicine

## 2013-12-24 ENCOUNTER — Encounter: Payer: Self-pay | Admitting: Internal Medicine

## 2013-12-24 VITALS — BP 150/82 | HR 92 | Temp 98.7°F | Wt 208.8 lb

## 2013-12-24 DIAGNOSIS — I10 Essential (primary) hypertension: Secondary | ICD-10-CM

## 2013-12-24 DIAGNOSIS — R0602 Shortness of breath: Secondary | ICD-10-CM

## 2013-12-24 DIAGNOSIS — J45909 Unspecified asthma, uncomplicated: Secondary | ICD-10-CM

## 2013-12-24 DIAGNOSIS — R079 Chest pain, unspecified: Secondary | ICD-10-CM

## 2013-12-24 DIAGNOSIS — F411 Generalized anxiety disorder: Secondary | ICD-10-CM

## 2013-12-24 DIAGNOSIS — J4520 Mild intermittent asthma, uncomplicated: Secondary | ICD-10-CM

## 2013-12-24 MED ORDER — ALBUTEROL SULFATE HFA 108 (90 BASE) MCG/ACT IN AERS: 2.0000 | INHALATION_SPRAY | Freq: Four times a day (QID) | RESPIRATORY_TRACT | Status: DC | PRN

## 2013-12-24 NOTE — Patient Instructions (Signed)
Please continue all other medications as before, and refills have been done if requested.  Please have the pharmacy call with any other refills you may need.  Please continue your efforts at being more active, low cholesterol diet, and weight control.  You are otherwise up to date with prevention measures today.  Please keep your appointments with your specialists as you may have planned  You will be contacted regarding the referral for: stress testing  Your form will be filled out, and you should be called when done

## 2013-12-24 NOTE — Assessment & Plan Note (Addendum)
ECG reviewed as per emr, atypical, not assoc with recent wheezing at times, cant r/o cardiac, for stress test  Note:  Total time for pt hx, exam, review of record with pt in the room, determination of diagnoses and plan for further eval and tx is > 40 min, with over 50% spent in coordination and counseling of patient

## 2013-12-24 NOTE — Progress Notes (Signed)
Pre visit review using our clinic review tool, if applicable. No additional management support is needed unless otherwise documented below in the visit note. 
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
## 2013-12-27 ENCOUNTER — Encounter: Payer: Self-pay | Admitting: Internal Medicine

## 2013-12-27 DIAGNOSIS — J45909 Unspecified asthma, uncomplicated: Secondary | ICD-10-CM

## 2013-12-27 HISTORY — DX: Unspecified asthma, uncomplicated: J45.909

## 2013-12-27 NOTE — Assessment & Plan Note (Signed)
?   Viral, now improved, cont ventolin prn

## 2013-12-27 NOTE — Assessment & Plan Note (Signed)
signficant stressor at work, declines counseling for now or psych referral, to f/u any worsening symptoms or concerns, plans to stay off work until current eval done

## 2013-12-27 NOTE — Assessment & Plan Note (Signed)
stable overall by history and exam, recent data reviewed with pt, and pt to continue medical treatment as before,  to f/u any worsening symptoms or concerns BP Readings from Last 3 Encounters:  12/24/13 150/82  12/07/13 154/91  10/22/13 128/78

## 2013-12-30 ENCOUNTER — Telehealth: Payer: Self-pay | Admitting: Internal Medicine

## 2013-12-30 NOTE — Telephone Encounter (Signed)
[facility] as pt has been referred for stress testing due to recent CP to r/o cardiac etiology

## 2013-12-30 NOTE — Telephone Encounter (Signed)
Called the patient left message to call back 

## 2013-12-30 NOTE — Telephone Encounter (Signed)
Patient had an appointment with Dr. Lianne MorisAjlouny DPM. [facility] PH#[phone]. She is requesting medical Clearance from Dr. Jonny RuizJohn for foot surgery on Monday 08/03.  Please advise.

## 2013-12-31 NOTE — Telephone Encounter (Signed)
Called left msg. To call back 

## 2013-12-31 NOTE — Telephone Encounter (Signed)
Patient informed of MD instructions on surgery clearance.

## 2014-01-07 DIAGNOSIS — Z0279 Encounter for issue of other medical certificate: Secondary | ICD-10-CM

## 2014-01-12 ENCOUNTER — Encounter: Payer: Self-pay | Admitting: Cardiology

## 2014-01-12 ENCOUNTER — Ambulatory Visit (INDEPENDENT_AMBULATORY_CARE_PROVIDER_SITE_OTHER): Payer: Managed Care, Other (non HMO) | Admitting: Cardiology

## 2014-01-12 VITALS — BP 120/78 | HR 76 | Ht 69.0 in | Wt 211.8 lb

## 2014-01-12 DIAGNOSIS — I10 Essential (primary) hypertension: Secondary | ICD-10-CM

## 2014-01-12 DIAGNOSIS — R079 Chest pain, unspecified: Secondary | ICD-10-CM

## 2014-01-12 DIAGNOSIS — E785 Hyperlipidemia, unspecified: Secondary | ICD-10-CM

## 2014-01-12 DIAGNOSIS — R011 Cardiac murmur, unspecified: Secondary | ICD-10-CM

## 2014-01-12 MED ORDER — AMLODIPINE BESYLATE 5 MG PO TABS: 5.0000 mg | ORAL_TABLET | Freq: Every day | ORAL | Status: DC

## 2014-01-12 MED ORDER — LANSOPRAZOLE 15 MG PO CPDR: 15.0000 mg | DELAYED_RELEASE_CAPSULE | Freq: Every day | ORAL | Status: DC

## 2014-01-12 NOTE — Patient Instructions (Signed)
Your physician has recommended you make the following change in your medication:   STOP TAKING HYDRALAZINE NOW  START TAKING AMLODIPINE 5 MG NOW  START TAKING PREVACID 15 MG NOW  YOUR MD HAS ORDERED FOR YOU TO GET A CORONARY CT PERFORMED FOR YOUR CHEST PAIN  Your physician recommends that you schedule a follow-up appointment in: WITH DR NELSON IN 2 MONTHS  PLEASE CALL OUR OFFICE IN ONE WEEK TO REPORT HOW YOU ARE FEELING
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
## 2014-01-19 ENCOUNTER — Encounter: Payer: Self-pay | Admitting: Cardiology

## 2014-01-21 ENCOUNTER — Telehealth: Payer: Self-pay | Admitting: Internal Medicine

## 2014-01-21 DIAGNOSIS — R911 Solitary pulmonary nodule: Secondary | ICD-10-CM

## 2014-01-21 NOTE — Telephone Encounter (Signed)
Patient came by to check on status of disability forms.  He also wanted someone to refer to Dr. Delton SeeNelson on when he needs to return to work.

## 2014-01-22 ENCOUNTER — Telehealth: Payer: Self-pay | Admitting: Cardiology

## 2014-01-25 ENCOUNTER — Telehealth: Payer: Self-pay | Admitting: Cardiology

## 2014-01-25 NOTE — Telephone Encounter (Signed)
LVMOM For Pt to Return My Call  8.24.15/km

## 2014-01-25 NOTE — Telephone Encounter (Signed)
Walk-In Patient form received on 8.21.15 with a Physician's Statement and FMLA attached for completion:djc

## 2014-01-26 ENCOUNTER — Telehealth: Payer: Self-pay | Admitting: Cardiology

## 2014-01-26 NOTE — Telephone Encounter (Signed)
Spoke With Pt He is aware Dr.Nelson Will not Complete FMLA & Disability Claim Form,also Made Pt aware Dr. Delton See Will Not Be writing a Back To Work  Note Since she is Not the One who Took pt out Of Work. All disability papers Dropped Off have Been left at [facility] For Pt pick up per His request  8.25.15/km

## 2014-01-27 ENCOUNTER — Ambulatory Visit (HOSPITAL_COMMUNITY)
Admission: RE | Admit: 2014-01-27 | Discharge: 2014-01-27 | Disposition: A | Discharge status: Home / Self Care | Payer: Managed Care, Other (non HMO) | Source: Ambulatory Visit | Attending: Cardiology | Admitting: Cardiology

## 2014-01-27 DIAGNOSIS — R011 Cardiac murmur, unspecified: Secondary | ICD-10-CM

## 2014-01-27 DIAGNOSIS — E785 Hyperlipidemia, unspecified: Secondary | ICD-10-CM

## 2014-01-27 DIAGNOSIS — R079 Chest pain, unspecified: Secondary | ICD-10-CM

## 2014-01-27 DIAGNOSIS — I10 Essential (primary) hypertension: Secondary | ICD-10-CM

## 2014-01-28 ENCOUNTER — Ambulatory Visit (HOSPITAL_COMMUNITY)
Admission: RE | Admit: 2014-01-28 | Discharge: 2014-01-28 | Disposition: A | Discharge status: Home / Self Care | Payer: Managed Care, Other (non HMO) | Source: Ambulatory Visit | Attending: Cardiology | Admitting: Cardiology

## 2014-01-28 ENCOUNTER — Encounter (HOSPITAL_COMMUNITY): Payer: Self-pay

## 2014-01-28 DIAGNOSIS — R911 Solitary pulmonary nodule: Secondary | ICD-10-CM | POA: Diagnosis not present

## 2014-01-28 DIAGNOSIS — R079 Chest pain, unspecified: Secondary | ICD-10-CM

## 2014-01-28 MED ORDER — IOHEXOL 350 MG/ML SOLN
80.0000 mL | Freq: Once | INTRAVENOUS | Status: AC | PRN
  Administered 2014-01-28: 80 mL via INTRAVENOUS

## 2014-01-28 MED ORDER — METOPROLOL TARTRATE 1 MG/ML IV SOLN
5.0000 mg | Freq: Once | INTRAVENOUS | Status: AC
  Administered 2014-01-28: 5 mg via INTRAVENOUS
  Filled 2014-01-28: qty 5

## 2014-01-28 MED ORDER — NITROGLYCERIN 0.4 MG SL SUBL
SUBLINGUAL_TABLET | SUBLINGUAL | Status: AC
  Administered 2014-01-28: 0.8 mg
  Filled 2014-01-28: qty 2

## 2014-01-28 MED ORDER — METOPROLOL TARTRATE 1 MG/ML IV SOLN
INTRAVENOUS | Status: AC
  Filled 2014-01-28: qty 5

## 2014-01-29 NOTE — Telephone Encounter (Signed)
Message copied by Loa Socks on Fri Jan 29, 2014  3:44 PM ------      Message from: Lars Masson      Created: Fri Jan 29, 2014  1:12 PM       Normal coronary CT, no CAD.      There is a 4 mm RIGHT middle lobe pulmonary nodule.      We should schedule another chest CT in 1 year.       ------

## 2014-01-29 NOTE — Telephone Encounter (Signed)
Notified pt of his coronary ct showing normal, and no CAD.  Informed pt that per Dr Delton See there was a 4 mm right middle lobe pulmonary nodule. Informed pt that Dr Delton See recommends the pt have a repeat chest ct in 1 year.  Pt verbalized understanding and agrees with this plan.  Will send [facility] a message to have this arranged.

## 2014-02-10 ENCOUNTER — Encounter: Payer: Self-pay | Admitting: Internal Medicine

## 2014-02-10 ENCOUNTER — Ambulatory Visit (INDEPENDENT_AMBULATORY_CARE_PROVIDER_SITE_OTHER): Payer: Managed Care, Other (non HMO) | Admitting: Internal Medicine

## 2014-02-10 VITALS — BP 120/62 | HR 83 | Temp 98.3°F | Wt 210.1 lb

## 2014-02-10 DIAGNOSIS — J4552 Severe persistent asthma with status asthmaticus: Secondary | ICD-10-CM

## 2014-02-10 DIAGNOSIS — R0789 Other chest pain: Secondary | ICD-10-CM

## 2014-02-10 DIAGNOSIS — R7309 Other abnormal glucose: Secondary | ICD-10-CM

## 2014-02-10 DIAGNOSIS — Z23 Encounter for immunization: Secondary | ICD-10-CM

## 2014-02-10 DIAGNOSIS — I10 Essential (primary) hypertension: Secondary | ICD-10-CM

## 2014-02-10 DIAGNOSIS — R7302 Impaired glucose tolerance (oral): Secondary | ICD-10-CM

## 2014-02-10 DIAGNOSIS — J45902 Unspecified asthma with status asthmaticus: Secondary | ICD-10-CM
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
## 2014-02-10 NOTE — Patient Instructions (Addendum)
You had the flu shot today  Please continue all other medications as before, and refills have been done if requested.  Please have the pharmacy call with any other refills you may need.  Please keep your appointments with your specialists as you may have planned  Your form for work will be filled out

## 2014-02-10 NOTE — Progress Notes (Signed)
Pre visit review using our clinic review tool, if applicable. No additional management support is needed unless otherwise documented below in the visit note. 

## 2014-02-11 NOTE — Assessment & Plan Note (Signed)
Resolved, noncardiac, recent eval neg, had recent stress test as well, no further testing needed at this time

## 2014-02-11 NOTE — Assessment & Plan Note (Signed)
stable overall by history and exam, recent data reviewed with pt, and pt to continue medical treatment as before,  to f/u any worsening symptoms or concerns BP Readings from Last 3 Encounters:  02/10/14 120/62  01/28/14 118/80  01/12/14 120/78

## 2014-02-11 NOTE — Assessment & Plan Note (Signed)
symtpoms resolved, ok to return to work, forms to be signed for pt pickup

## 2014-02-11 NOTE — Assessment & Plan Note (Signed)
.  stable overall by history and exam, recent data reviewed with pt, and pt to continue medical treatment as before,  to f/u any worsening symptoms or concerns Lab Results  Component Value Date   HGBA1C 5.7* 09/25/2013

## 2014-03-22 ENCOUNTER — Ambulatory Visit (INDEPENDENT_AMBULATORY_CARE_PROVIDER_SITE_OTHER): Payer: Managed Care, Other (non HMO) | Admitting: Cardiology

## 2014-03-22 ENCOUNTER — Encounter: Payer: Self-pay | Admitting: Cardiology

## 2014-03-22 VITALS — BP 122/64 | HR 83 | Ht 69.0 in | Wt 217.0 lb

## 2014-03-22 DIAGNOSIS — E785 Hyperlipidemia, unspecified: Secondary | ICD-10-CM
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
## 2014-03-22 NOTE — Patient Instructions (Signed)
Your physician recommends that you continue on your current medications as directed. Please refer to the Current Medication list given to you today.  Your physician recommends that you return for a FASTING lipid profile and cmet in: 1 year  Your physician wants you to follow-up in: 1 year with Dr.Nelson You will receive a reminder letter in the mail two months in advance. If you don't receive a letter, please call our office to schedule the follow-up appointment.

## 2014-04-09 ENCOUNTER — Ambulatory Visit: Payer: Managed Care, Other (non HMO) | Admitting: Internal Medicine

## 2014-04-17 ENCOUNTER — Other Ambulatory Visit: Payer: Self-pay | Admitting: Internal Medicine

## 2014-06-15 ENCOUNTER — Other Ambulatory Visit: Payer: Self-pay | Admitting: Internal Medicine

## 2014-07-15 ENCOUNTER — Other Ambulatory Visit: Payer: Self-pay | Admitting: Internal Medicine

## 2014-07-20 ENCOUNTER — Other Ambulatory Visit: Payer: Self-pay | Admitting: *Deleted

## 2014-07-20 MED ORDER — IRBESARTAN 300 MG PO TABS: 300.0000 mg | ORAL_TABLET | Freq: Every day | ORAL | Status: DC

## 2014-10-14 ENCOUNTER — Other Ambulatory Visit: Payer: Self-pay | Admitting: Internal Medicine

## 2014-10-15 ENCOUNTER — Encounter: Payer: Self-pay | Admitting: Internal Medicine

## 2014-10-15 ENCOUNTER — Other Ambulatory Visit (INDEPENDENT_AMBULATORY_CARE_PROVIDER_SITE_OTHER): Payer: Self-pay

## 2014-10-15 ENCOUNTER — Ambulatory Visit (INDEPENDENT_AMBULATORY_CARE_PROVIDER_SITE_OTHER): Payer: Managed Care, Other (non HMO) | Admitting: Internal Medicine

## 2014-10-15 VITALS — BP 120/78 | HR 84 | Temp 98.2°F | Ht 69.0 in | Wt 212.0 lb

## 2014-10-15 DIAGNOSIS — Z0000 Encounter for general adult medical examination without abnormal findings: Secondary | ICD-10-CM

## 2014-10-15 DIAGNOSIS — R7302 Impaired glucose tolerance (oral): Secondary | ICD-10-CM

## 2014-10-15 DIAGNOSIS — F329 Major depressive disorder, single episode, unspecified: Secondary | ICD-10-CM

## 2014-10-15 DIAGNOSIS — F32A Depression, unspecified: Secondary | ICD-10-CM

## 2014-10-15 LAB — HEPATIC FUNCTION PANEL
ALK PHOS: 56 U/L (ref 39–117)
ALT: 15 U/L (ref 0–53)
AST: 18 U/L (ref 0–37)
Albumin: 4.3 g/dL (ref 3.5–5.2)
BILIRUBIN TOTAL: 0.5 mg/dL (ref 0.2–1.2)
Bilirubin, Direct: 0.1 mg/dL (ref 0.0–0.3)
Total Protein: 7.9 g/dL (ref 6.0–8.3)

## 2014-10-15 LAB — URINALYSIS, ROUTINE W REFLEX MICROSCOPIC
Bilirubin Urine: NEGATIVE
Hgb urine dipstick: NEGATIVE
KETONES UR: NEGATIVE
Leukocytes, UA: NEGATIVE
Nitrite: NEGATIVE
PH: 6.5 (ref 5.0–8.0)
Specific Gravity, Urine: 1.02 (ref 1.000–1.030)
TOTAL PROTEIN, URINE-UPE24: NEGATIVE
Urine Glucose: NEGATIVE
Urobilinogen, UA: 0.2 (ref 0.0–1.0)

## 2014-10-15 LAB — CBC WITH DIFFERENTIAL/PLATELET
Basophils Absolute: 0.1 10*3/uL (ref 0.0–0.1)
Basophils Relative: 1.4 % (ref 0.0–3.0)
EOS PCT: 4.5 % (ref 0.0–5.0)
Eosinophils Absolute: 0.2 10*3/uL (ref 0.0–0.7)
HCT: 38.1 % — ABNORMAL LOW (ref 39.0–52.0)
Hemoglobin: 13.4 g/dL (ref 13.0–17.0)
Lymphocytes Relative: 25.7 % (ref 12.0–46.0)
Lymphs Abs: 0.9 10*3/uL (ref 0.7–4.0)
MCHC: 35.2 g/dL (ref 30.0–36.0)
MCV: 92.9 fl (ref 78.0–100.0)
Monocytes Absolute: 0.3 10*3/uL (ref 0.1–1.0)
Monocytes Relative: 7.7 % (ref 3.0–12.0)
NEUTROS ABS: 2.2 10*3/uL (ref 1.4–7.7)
NEUTROS PCT: 60.7 % (ref 43.0–77.0)
Platelets: 213 10*3/uL (ref 150.0–400.0)
RBC: 4.1 Mil/uL — ABNORMAL LOW (ref 4.22–5.81)
RDW: 15.2 % (ref 11.5–15.5)
WBC: 3.7 10*3/uL — ABNORMAL LOW (ref 4.0–10.5)

## 2014-10-15 LAB — BASIC METABOLIC PANEL
BUN: 15 mg/dL (ref 6–23)
CALCIUM: 9.3 mg/dL (ref 8.4–10.5)
CO2: 29 mEq/L (ref 19–32)
CREATININE: 0.85 mg/dL (ref 0.40–1.50)
Chloride: 101 mEq/L (ref 96–112)
GFR: 121.66 mL/min (ref >60.00)
Glucose, Bld: 129 mg/dL — ABNORMAL HIGH (ref 70–99)
Potassium: 3.9 mEq/L (ref 3.5–5.1)
Sodium: 136 mEq/L (ref 135–145)

## 2014-10-15 LAB — LIPID PANEL
CHOLESTEROL: 228 mg/dL — AB (ref 0–200)
HDL: 73.1 mg/dL (ref >39.00)
LDL Cholesterol: 137 mg/dL — ABNORMAL HIGH (ref 0–99)
NonHDL: 154.9
Total CHOL/HDL Ratio: 3
Triglycerides: 91 mg/dL (ref 0.0–149.0)
VLDL: 18.2 mg/dL (ref 0.0–40.0)

## 2014-10-15 LAB — TSH: TSH: 1.33 u[IU]/mL (ref 0.35–4.50)

## 2014-10-15 LAB — HEMOGLOBIN A1C: Hgb A1c MFr Bld: 6.1 % (ref 4.6–6.5)

## 2014-10-15 LAB — PSA: PSA: 2.02 ng/mL (ref 0.10–4.00)

## 2014-10-15 MED ORDER — ALBUTEROL SULFATE HFA 108 (90 BASE) MCG/ACT IN AERS
2.0000 | INHALATION_SPRAY | Freq: Four times a day (QID) | RESPIRATORY_TRACT | Status: DC | PRN
Start: 2014-10-15 — End: 2016-02-02

## 2014-10-15 MED ORDER — ESCITALOPRAM OXALATE 10 MG PO TABS: 10.0000 mg | ORAL_TABLET | Freq: Every day | ORAL | Status: DC

## 2014-10-15 MED ORDER — SIMVASTATIN 20 MG PO TABS: 20.0000 mg | ORAL_TABLET | Freq: Every day | ORAL | Status: DC

## 2014-10-15 MED ORDER — CARVEDILOL 25 MG PO TABS: 25.0000 mg | ORAL_TABLET | Freq: Two times a day (BID) | ORAL | Status: DC

## 2014-10-15 NOTE — Patient Instructions (Addendum)
Please take all new medication as prescribed - the lexapro 10 mg per day  Please continue all other medications as before, and refills have been done if requested.  Please have the pharmacy call with any other refills you may need.  Please continue your efforts at being more active, low cholesterol diet, and weight control.  You are otherwise up to date with prevention measures today.  Please keep your appointments with your specialists as you may have planned  Please call if you would want to pursue the colonoscopy.    Please go to the LAB in the Basement (turn left off the elevator) for the tests to be done today  You will be contacted by phone if any changes need to be made immediately.  Otherwise, you will receive a letter about your results with an explanation, but please check with MyChart first.  Please remember to sign up for MyChart if you have not done so, as this will be important to you in the future with finding out test results, communicating by private email, and scheduling acute appointments online when needed.  Please return in 1 year for your yearly visit, or sooner if needed
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
## 2014-10-15 NOTE — Progress Notes (Signed)
Pre visit review using our clinic review tool, if applicable. No additional management support is needed unless otherwise documented below in the visit note. 

## 2014-10-15 NOTE — Assessment & Plan Note (Signed)
To start lexapro 10 qd, declines referral for counseling, verified no SI or HI

## 2014-10-15 NOTE — Assessment & Plan Note (Signed)
stable overall by history and exam, recent data reviewed with pt, and pt to continue medical treatment as before,  to f/u any worsening symptoms or concerns Lab Results  Component Value Date   HGBA1C 5.7* 09/25/2013   For f/u a1c

## 2014-10-15 NOTE — Assessment & Plan Note (Signed)

## 2014-11-24 ENCOUNTER — Other Ambulatory Visit: Payer: Self-pay | Admitting: Internal Medicine

## 2015-01-14 ENCOUNTER — Other Ambulatory Visit: Payer: Self-pay | Admitting: Cardiology

## 2015-01-31 ENCOUNTER — Inpatient Hospital Stay: Admission: RE | Admit: 2015-01-31 | Payer: Managed Care, Other (non HMO) | Source: Ambulatory Visit

## 2015-03-13 ENCOUNTER — Other Ambulatory Visit: Payer: Self-pay | Admitting: Internal Medicine

## 2015-05-19 ENCOUNTER — Other Ambulatory Visit: Payer: Self-pay

## 2015-05-19 MED ORDER — ISOSORBIDE MONONITRATE ER 30 MG PO TB24: 30.0000 mg | ORAL_TABLET | Freq: Every day | ORAL | Status: DC

## 2015-07-11 ENCOUNTER — Other Ambulatory Visit: Payer: Self-pay | Admitting: Internal Medicine

## 2015-08-29 ENCOUNTER — Encounter (HOSPITAL_COMMUNITY): Payer: Self-pay | Admitting: *Deleted

## 2015-08-29 ENCOUNTER — Emergency Department (HOSPITAL_COMMUNITY)
Admission: EM | Admit: 2015-08-29 | Discharge: 2015-08-29 | Discharge status: Against Medical Advice | Payer: Self-pay | Attending: Emergency Medicine | Admitting: Emergency Medicine

## 2015-08-29 DIAGNOSIS — I10 Essential (primary) hypertension: Secondary | ICD-10-CM | POA: Insufficient documentation

## 2015-08-29 DIAGNOSIS — Z7982 Long term (current) use of aspirin: Secondary | ICD-10-CM | POA: Insufficient documentation

## 2015-08-29 DIAGNOSIS — Z79899 Other long term (current) drug therapy: Secondary | ICD-10-CM | POA: Insufficient documentation

## 2015-08-29 DIAGNOSIS — J45909 Unspecified asthma, uncomplicated: Secondary | ICD-10-CM | POA: Insufficient documentation

## 2015-08-29 DIAGNOSIS — R011 Cardiac murmur, unspecified: Secondary | ICD-10-CM | POA: Insufficient documentation

## 2015-08-29 DIAGNOSIS — R040 Epistaxis: Secondary | ICD-10-CM | POA: Insufficient documentation

## 2015-08-29 DIAGNOSIS — Z87891 Personal history of nicotine dependence: Secondary | ICD-10-CM | POA: Insufficient documentation

## 2015-08-29 DIAGNOSIS — F419 Anxiety disorder, unspecified: Secondary | ICD-10-CM | POA: Insufficient documentation

## 2015-08-29 DIAGNOSIS — F329 Major depressive disorder, single episode, unspecified: Secondary | ICD-10-CM | POA: Insufficient documentation

## 2015-08-29 DIAGNOSIS — Z87442 Personal history of urinary calculi: Secondary | ICD-10-CM | POA: Insufficient documentation

## 2015-08-29 HISTORY — DX: Cardiac murmur, unspecified: R01.1

## 2015-08-29 MED ORDER — TRANEXAMIC ACID 1000 MG/10ML IV SOLN
500.0000 mg | Freq: Once | INTRAVENOUS | Status: DC
  Filled 2015-08-29: qty 10

## 2015-08-29 NOTE — ED Notes (Signed)
Patient left at this time with all belongings. 

## 2015-08-29 NOTE — ED Notes (Signed)
Pt placed into gown and on to monitor upon arrival to room. Pt monitored by blood pressure and pulse ox.  
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
## 2015-08-29 NOTE — Discharge Instructions (Signed)
As discussed, if you nose begins to bleed again, it is important that you apply pressure 20 minutes, take a break, and continues to bleed after another 20 minute period of constant pressure, return here.   Regardless, please be sure to follow-up with our ENT physician for further evaluation and management.   Nosebleed Nosebleeds are common. They are due to a crack in the inside lining of your nose (mucous membrane) or from a small blood vessel that starts to bleed. Nosebleeds can be caused by many conditions, such as injury, infections, dry mucous membranes or dry climate, medicines, nose picking, and home heating and cooling systems. Most nosebleeds come from blood vessels in the front of your nose. HOME CARE INSTRUCTIONS   Try controlling your nosebleed by pinching your nostrils gently and continuously for at least 10 minutes.  Avoid blowing or sniffing your nose for a number of hours after having a nosebleed.  Do not put gauze inside your nose yourself. If your nose was packed by your health care provider, try to maintain the pack inside of your nose until your health care provider removes it.  If a gauze pack was used and it starts to fall out, gently replace it or cut off the end of it.  If a balloon catheter was used to pack your nose, do not cut or remove it unless your health care provider has instructed you to do that.  Avoid lying down while you are having a nosebleed. Sit up and lean forward.  Use a nasal spray decongestant to help with a nosebleed as directed by your health care provider.  Do not use petroleum jelly or mineral oil in your nose. These can drip into your lungs.  Maintain humidity in your home by using less air conditioning or by using a humidifier.  Aspirinand blood thinners make bleeding more likely. If you are prescribed these medicines and you suffer from nosebleeds, ask your health care provider if you should stop taking the medicines or adjust the dose. Do  not stop medicines unless directed by your health care provider  Resume your normal activities as you are able, but avoid straining, lifting, or bending at the waist for several days.  If your nosebleed was caused by dry mucous membranes, use over-the-counter saline nasal spray or gel. This will keep the mucous membranes moist and allow them to heal. If you must use a lubricant, choose the water-soluble variety. Use it only sparingly, and do not use it within several hours of lying down.  Keep all follow-up visits as directed by your health care provider. This is important. SEEK MEDICAL CARE IF:  You have a fever.  You get frequent nosebleeds.  You are getting nosebleeds more often. SEEK IMMEDIATE MEDICAL CARE IF:  Your nosebleed lasts longer than 20 minutes.  Your nosebleed occurs after an injury to your face, and your nose looks crooked or broken.  You have unusual bleeding from other parts of your body.  You have unusual bruising on other parts of your body.  You feel light-headed or you faint.  You become sweaty.  You vomit blood.  Your nosebleed occurs after a head injury.   This information is not intended to replace advice given to you by your health care provider. Make sure you discuss any questions you have with your health care provider.   Document Released: 02/28/2005 Document Revised: 06/11/2014 Document Reviewed: 01/04/2014 Elsevier Interactive Patient Education [employer]2016 Elsevier Inc.

## 2015-08-29 NOTE — ED Notes (Signed)
Pt reports left side nosebleed that started around 1100. Reports also having episode on Friday which lasted even longer. Takes aspirin. Pt has gauze in nare on arrival, no active bleeding noted at triage.

## 2015-10-17 ENCOUNTER — Other Ambulatory Visit: Payer: Self-pay | Admitting: Internal Medicine

## 2015-10-20 ENCOUNTER — Other Ambulatory Visit: Payer: Self-pay | Admitting: Internal Medicine

## 2015-10-31 ENCOUNTER — Other Ambulatory Visit: Payer: Self-pay | Admitting: Internal Medicine

## 2015-12-28 ENCOUNTER — Other Ambulatory Visit: Payer: Self-pay | Admitting: *Deleted

## 2016-01-19 ENCOUNTER — Other Ambulatory Visit: Payer: Self-pay | Admitting: Cardiology

## 2016-01-19 ENCOUNTER — Other Ambulatory Visit: Payer: Self-pay | Admitting: Internal Medicine

## 2016-01-19 DIAGNOSIS — I10 Essential (primary) hypertension: Secondary | ICD-10-CM

## 2016-01-19 DIAGNOSIS — R011 Cardiac murmur, unspecified: Secondary | ICD-10-CM

## 2016-01-19 DIAGNOSIS — R079 Chest pain, unspecified: Secondary | ICD-10-CM

## 2016-01-19 MED ORDER — LANSOPRAZOLE 15 MG PO CPDR: 15.0000 mg | DELAYED_RELEASE_CAPSULE | Freq: Every day | ORAL | 0 refills | Status: DC

## 2016-01-19 NOTE — Addendum Note (Signed)
Addended by: Awilda BillARDEN, Joyclyn Plazola J on: 01/19/2016 05:01 PM   Modules accepted: Orders

## 2016-01-19 NOTE — Telephone Encounter (Signed)
Call Documentation   Jamie Socksvy M Martin, Jamie Stewart at 5/62/13088/17/2017 3:39 PM   Status: Signed    Will send this refill request in to Dr Delton SeeNelson to review and advise on, but he most certainly is overdue to see her, and needs to be compliant and schedule a follow-up appt for continuity of care and further refills of cardiac meds.  He was once prescribed this medication by Dr Delton SeeNelson, and unsure what other Provider discontinued this medication, but will need her authorization to reorder this med, being he's overdue on his follow-up.     Encounter MyChart Messages   No messages in this encounter  Diagnoses    Codes Comments  Essential hypertension  ICD-9-CM: 401.9 ICD-10-CM: I10   Chest pain, unspecified chest pain type  ICD-9-CM: 786.50 ICD-10-CM: R07.9   Heart murmur  ICD-9-CM: 785.2 ICD-10-CM: R01.1       Approved    Disp Refills Start End  lansoprazole (PREVACID) 15 MG capsule 90 capsule 0 01/19/2016   Sig - Route:  TAKE 1 CAPSULE (15 MG TOTAL) BY MOUTH DAILY AT 12 NOON. - Oral  Class:  Normal  DAW:  No  Authorizing Provider:  [name], Jamie Stewart  Visit Pharmacy   [facility] [phone] - [place], [place] - [address]  Contacts    Type Contact Phone  01/19/2016 02:50 PM Interface (Incoming) [phone]  Routing History   Priority Sent On From To Message Type   01/19/2016 4:35 PM Jamie MassonKatarina H Nelson, Jamie Stewart Jamie SocksIvy M Martin, Jamie Stewart Rx Response   01/19/2016 3:44 PM Lajoyce CornersIvy Sheron NightingaleM Martin, Jamie Stewart Jamie MassonKatarina H Nelson, Jamie Stewart Rx Request   Comment: Review and advise on refill request   01/19/2016 3:26 PM [name], CMA Jamie SocksIvy M Martin, Jamie Stewart Rx Request   Comment: Pt wants Pravacid, not on med list and hasnt seen Dr. Delton SeeNelson since 2015, please advise   01/19/2016 2:50 PM Interface, Surescripts Out [facility] Refill Rx Request  Created by   [employer], Surescripts Out on 01/19/2016 02:50 PM

## 2016-01-19 NOTE — Telephone Encounter (Signed)
Will send this refill request in to Dr Delton SeeNelson to review and advise on, but he most certainly is overdue to see her, and needs to be compliant and schedule a follow-up appt for continuity of care and further refills of cardiac meds.  He was once prescribed this medication by Dr Delton SeeNelson, and unsure what other Provider discontinued this medication, but will need her authorization to reorder this med, being he's overdue on his follow-up.

## 2016-01-20 ENCOUNTER — Other Ambulatory Visit: Payer: Self-pay | Admitting: Cardiology

## 2016-01-20 DIAGNOSIS — R079 Chest pain, unspecified: Secondary | ICD-10-CM

## 2016-01-20 DIAGNOSIS — I10 Essential (primary) hypertension: Secondary | ICD-10-CM

## 2016-01-20 DIAGNOSIS — R011 Cardiac murmur, unspecified: Secondary | ICD-10-CM

## 2016-01-21 ENCOUNTER — Other Ambulatory Visit: Payer: Self-pay | Admitting: Internal Medicine

## 2016-02-01 ENCOUNTER — Telehealth: Payer: Self-pay

## 2016-02-01 ENCOUNTER — Other Ambulatory Visit (INDEPENDENT_AMBULATORY_CARE_PROVIDER_SITE_OTHER): Payer: Self-pay

## 2016-02-01 DIAGNOSIS — Z0000 Encounter for general adult medical examination without abnormal findings: Secondary | ICD-10-CM

## 2016-02-01 LAB — MICROALBUMIN / CREATININE URINE RATIO
Creatinine,U: 266.5 mg/dL
MICROALB UR: 1.2 mg/dL (ref 0.0–1.9)
MICROALB/CREAT RATIO: 0.5 mg/g (ref 0.0–30.0)

## 2016-02-01 LAB — CBC
HCT: 33.9 % — ABNORMAL LOW (ref 39.0–52.0)
Hemoglobin: 11.7 g/dL — ABNORMAL LOW (ref 13.0–17.0)
MCHC: 34.6 g/dL (ref 30.0–36.0)
MCV: 94.7 fl (ref 78.0–100.0)
PLATELETS: 208 10*3/uL (ref 150.0–400.0)
RBC: 3.58 Mil/uL — ABNORMAL LOW (ref 4.22–5.81)
RDW: 14.3 % (ref 11.5–15.5)
WBC: 3.5 10*3/uL — ABNORMAL LOW (ref 4.0–10.5)

## 2016-02-01 LAB — LIPID PANEL
CHOLESTEROL: 157 mg/dL (ref 0–200)
HDL: 48.7 mg/dL (ref >39.00)
LDL Cholesterol: 89 mg/dL (ref 0–99)
NonHDL: 107.94
TRIGLYCERIDES: 94 mg/dL (ref 0.0–149.0)
Total CHOL/HDL Ratio: 3
VLDL: 18.8 mg/dL (ref 0.0–40.0)

## 2016-02-01 LAB — BASIC METABOLIC PANEL
BUN: 11 mg/dL (ref 6–23)
CALCIUM: 8.6 mg/dL (ref 8.4–10.5)
CO2: 29 meq/L (ref 19–32)
Chloride: 106 mEq/L (ref 96–112)
Creatinine, Ser: 0.85 mg/dL (ref 0.40–1.50)
GFR: 121.05 mL/min (ref >60.00)
GLUCOSE: 110 mg/dL — AB (ref 70–99)
Potassium: 3.7 mEq/L (ref 3.5–5.1)
SODIUM: 141 meq/L (ref 135–145)

## 2016-02-01 LAB — TSH: TSH: 0.99 u[IU]/mL (ref 0.35–4.50)

## 2016-02-01 LAB — HEMOGLOBIN A1C: Hgb A1c MFr Bld: 6.2 % (ref 4.6–6.5)

## 2016-02-01 LAB — PSA: PSA: 1.17 ng/mL (ref 0.10–4.00)

## 2016-02-01 NOTE — Telephone Encounter (Signed)
Orders placed.

## 2016-02-02 ENCOUNTER — Encounter: Payer: Self-pay | Admitting: Internal Medicine

## 2016-02-02 ENCOUNTER — Other Ambulatory Visit (INDEPENDENT_AMBULATORY_CARE_PROVIDER_SITE_OTHER): Payer: Self-pay

## 2016-02-02 ENCOUNTER — Ambulatory Visit (INDEPENDENT_AMBULATORY_CARE_PROVIDER_SITE_OTHER): Payer: Self-pay | Admitting: Internal Medicine

## 2016-02-02 VITALS — BP 138/80 | HR 75 | Temp 98.3°F | Resp 20 | Wt 218.0 lb

## 2016-02-02 DIAGNOSIS — F329 Major depressive disorder, single episode, unspecified: Secondary | ICD-10-CM

## 2016-02-02 DIAGNOSIS — R972 Elevated prostate specific antigen [PSA]: Secondary | ICD-10-CM

## 2016-02-02 DIAGNOSIS — D649 Anemia, unspecified: Secondary | ICD-10-CM

## 2016-02-02 DIAGNOSIS — Z1159 Encounter for screening for other viral diseases: Secondary | ICD-10-CM

## 2016-02-02 DIAGNOSIS — R911 Solitary pulmonary nodule: Secondary | ICD-10-CM | POA: Insufficient documentation

## 2016-02-02 DIAGNOSIS — Z0001 Encounter for general adult medical examination with abnormal findings: Secondary | ICD-10-CM

## 2016-02-02 DIAGNOSIS — R6889 Other general symptoms and signs: Secondary | ICD-10-CM

## 2016-02-02 DIAGNOSIS — Z1211 Encounter for screening for malignant neoplasm of colon: Secondary | ICD-10-CM

## 2016-02-02 DIAGNOSIS — F32A Depression, unspecified: Secondary | ICD-10-CM

## 2016-02-02 DIAGNOSIS — I10 Essential (primary) hypertension: Secondary | ICD-10-CM

## 2016-02-02 LAB — CBC WITH DIFFERENTIAL/PLATELET
BASOS ABS: 0 10*3/uL (ref 0.0–0.1)
Basophils Relative: 1 % (ref 0.0–3.0)
EOS ABS: 0.2 10*3/uL (ref 0.0–0.7)
EOS PCT: 5.1 % — AB (ref 0.0–5.0)
HEMATOCRIT: 36.3 % — AB (ref 39.0–52.0)
Hemoglobin: 12.5 g/dL — ABNORMAL LOW (ref 13.0–17.0)
Lymphocytes Relative: 34.8 % (ref 12.0–46.0)
Lymphs Abs: 1.4 10*3/uL (ref 0.7–4.0)
MCHC: 34.5 g/dL (ref 30.0–36.0)
MCV: 95.3 fl (ref 78.0–100.0)
MONO ABS: 0.5 10*3/uL (ref 0.1–1.0)
MONOS PCT: 12.6 % — AB (ref 3.0–12.0)
NEUTROS ABS: 1.9 10*3/uL (ref 1.4–7.7)
Neutrophils Relative %: 46.5 % (ref 43.0–77.0)
PLATELETS: 217 10*3/uL (ref 150.0–400.0)
RBC: 3.81 Mil/uL — ABNORMAL LOW (ref 4.22–5.81)
RDW: 14.8 % (ref 11.5–15.5)
WBC: 4 10*3/uL (ref 4.0–10.5)

## 2016-02-02 MED ORDER — ESCITALOPRAM OXALATE 10 MG PO TABS: ORAL_TABLET | ORAL | 3 refills | Status: DC

## 2016-02-02 MED ORDER — SIMVASTATIN 20 MG PO TABS: ORAL_TABLET | ORAL | 3 refills | Status: DC

## 2016-02-02 MED ORDER — SUMATRIPTAN SUCCINATE 100 MG PO TABS: 100.0000 mg | ORAL_TABLET | ORAL | 5 refills | Status: DC | PRN

## 2016-02-02 MED ORDER — CARVEDILOL 25 MG PO TABS: 25.0000 mg | ORAL_TABLET | Freq: Two times a day (BID) | ORAL | 3 refills | Status: DC

## 2016-02-02 MED ORDER — LANSOPRAZOLE 15 MG PO CPDR: 15.0000 mg | DELAYED_RELEASE_CAPSULE | Freq: Every day | ORAL | 3 refills | Status: DC

## 2016-02-02 MED ORDER — AMLODIPINE BESYLATE 5 MG PO TABS: 5.0000 mg | ORAL_TABLET | Freq: Every day | ORAL | 3 refills | Status: DC

## 2016-02-02 MED ORDER — IRBESARTAN 300 MG PO TABS
ORAL_TABLET | ORAL | 3 refills | Status: DC
Start: 2016-02-02 — End: 2016-12-22

## 2016-02-02 MED ORDER — ISOSORBIDE MONONITRATE ER 30 MG PO TB24: 30.0000 mg | ORAL_TABLET | Freq: Every day | ORAL | 1 refills | Status: DC

## 2016-02-02 MED ORDER — ALBUTEROL SULFATE HFA 108 (90 BASE) MCG/ACT IN AERS: 2.0000 | INHALATION_SPRAY | Freq: Four times a day (QID) | RESPIRATORY_TRACT | Status: DC | PRN

## 2016-02-02 NOTE — Progress Notes (Signed)
Pre visit review using our clinic review tool, if applicable. No additional management support is needed unless otherwise documented below in the visit note. 
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
## 2016-02-02 NOTE — Patient Instructions (Addendum)
Please take all new medication as prescribed - the imitrex for migraine  You are given the work note today  Please continue all other medications as before, and all refills have been done if requested.  You may wish to address the Imdur with Dr Delton SeeNelson regarding whether this is still needed  Please have the pharmacy call with any other refills you may need.  Please continue your efforts at being more active, low cholesterol diet, and weight control.  You are otherwise up to date with prevention measures today.  Please keep your appointments with your specialists as you may have planned  You will be contacted regarding the referral for: colonoscopy, and Chest CT scan  Please go to the LAB in the Basement (turn left off the elevator) for the tests to be done today - JUST the blood count and iron level today  Please go to the LAB in the Basement (turn left off the elevator) for the tests to be done IN 6 MONTHS for the repeat PSA test  You are given the work note today  Please return about feb 28, or sooner if needed

## 2016-02-02 NOTE — Progress Notes (Signed)
Letter done

## 2016-02-03 ENCOUNTER — Encounter: Payer: Self-pay | Admitting: Internal Medicine

## 2016-02-03 LAB — IBC PANEL
IRON: 129 ug/dL (ref 42–165)
SATURATION RATIOS: 37.9 % (ref 20.0–50.0)
TRANSFERRIN: 243 mg/dL (ref 212.0–360.0)

## 2016-02-03 LAB — HEPATITIS C ANTIBODY: HCV Ab: NEGATIVE

## 2016-02-03 LAB — PSA: PSA: 1.08 ng/mL (ref 0.10–4.00)

## 2016-02-05 DIAGNOSIS — R972 Elevated prostate specific antigen [PSA]: Secondary | ICD-10-CM | POA: Insufficient documentation

## 2016-02-05 DIAGNOSIS — D649 Anemia, unspecified: Secondary | ICD-10-CM | POA: Insufficient documentation

## 2016-02-05 NOTE — Assessment & Plan Note (Addendum)
For f/u chest ct,  to f/u any worsening symptoms or concerns  In addition to the time spent performing CPE, I spent an additional 25 minutes face to face,in which greater than 50% of this time was spent in counseling and coordination of care for patient's acute illness as documented.

## 2016-02-05 NOTE — Assessment & Plan Note (Addendum)
Improved,  to f/u any worsening symptoms or concerns , and psa in 7mo

## 2016-02-05 NOTE — Assessment & Plan Note (Signed)
Mild worsening related to chronic stressors, for lexapro restart, declines counseling referral or psychiatric referral

## 2016-02-05 NOTE — Assessment & Plan Note (Signed)

## 2016-02-05 NOTE — Assessment & Plan Note (Signed)
stable overall by history and exam, recent data reviewed with pt, and pt to continue medical treatment as before,  to f/u any worsening symptoms or concerns BP Readings from Last 3 Encounters:  02/02/16 138/80  08/29/15 (!) 154/102  10/15/14 120/78

## 2016-02-05 NOTE — Assessment & Plan Note (Signed)
No overt bleeding, for f/u cbc, iron 

## 2016-03-01 ENCOUNTER — Other Ambulatory Visit: Payer: Self-pay | Admitting: Cardiology

## 2016-03-07 ENCOUNTER — Encounter: Payer: Self-pay | Admitting: Cardiology

## 2016-03-13 ENCOUNTER — Encounter: Payer: Self-pay | Admitting: Internal Medicine

## 2016-03-17 ENCOUNTER — Other Ambulatory Visit: Payer: Self-pay | Admitting: Internal Medicine

## 2016-03-21 ENCOUNTER — Ambulatory Visit: Payer: Self-pay | Admitting: Cardiology

## 2016-03-23 ENCOUNTER — Encounter: Payer: Self-pay | Admitting: Cardiology

## 2016-03-24 ENCOUNTER — Other Ambulatory Visit: Payer: Self-pay | Admitting: Internal Medicine

## 2016-03-24 ENCOUNTER — Other Ambulatory Visit: Payer: Self-pay | Admitting: Cardiology

## 2016-03-27 ENCOUNTER — Encounter: Payer: Self-pay | Admitting: Internal Medicine

## 2016-05-28 ENCOUNTER — Other Ambulatory Visit: Payer: Self-pay | Admitting: Internal Medicine

## 2016-09-21 ENCOUNTER — Other Ambulatory Visit: Payer: Self-pay | Admitting: Internal Medicine

## 2016-10-28 ENCOUNTER — Other Ambulatory Visit: Payer: Self-pay | Admitting: Internal Medicine

## 2016-12-22 ENCOUNTER — Other Ambulatory Visit: Payer: Self-pay | Admitting: Internal Medicine

## 2017-01-02 ENCOUNTER — Other Ambulatory Visit: Payer: Self-pay | Admitting: Internal Medicine

## 2017-01-22 ENCOUNTER — Emergency Department (HOSPITAL_COMMUNITY): Payer: Self-pay

## 2017-01-22 ENCOUNTER — Encounter (HOSPITAL_COMMUNITY): Payer: Self-pay | Admitting: Nurse Practitioner

## 2017-01-22 DIAGNOSIS — R079 Chest pain, unspecified: Secondary | ICD-10-CM | POA: Insufficient documentation

## 2017-01-22 DIAGNOSIS — Z5321 Procedure and treatment not carried out due to patient leaving prior to being seen by health care provider: Secondary | ICD-10-CM | POA: Insufficient documentation

## 2017-01-22 LAB — BASIC METABOLIC PANEL
Anion gap: 13 (ref 5–15)
BUN: 9 mg/dL (ref 6–20)
CHLORIDE: 102 mmol/L (ref 101–111)
CO2: 24 mmol/L (ref 22–32)
Calcium: 8.8 mg/dL — ABNORMAL LOW (ref 8.9–10.3)
Creatinine, Ser: 0.93 mg/dL (ref 0.61–1.24)
GFR calc Af Amer: >60 mL/min (ref >60)
GFR calc non Af Amer: >60 mL/min (ref >60)
GLUCOSE: 154 mg/dL — AB (ref 65–99)
POTASSIUM: 3.4 mmol/L — AB (ref 3.5–5.1)
SODIUM: 139 mmol/L (ref 135–145)

## 2017-01-22 LAB — CBC
HEMATOCRIT: 36 % — AB (ref 39.0–52.0)
Hemoglobin: 11.9 g/dL — ABNORMAL LOW (ref 13.0–17.0)
MCH: 31.6 pg (ref 26.0–34.0)
MCHC: 33.1 g/dL (ref 30.0–36.0)
MCV: 95.5 fL (ref 78.0–100.0)
Platelets: 178 10*3/uL (ref 150–400)
RBC: 3.77 MIL/uL — ABNORMAL LOW (ref 4.22–5.81)
RDW: 15.1 % (ref 11.5–15.5)
WBC: 3.3 10*3/uL — AB (ref 4.0–10.5)

## 2017-01-22 LAB — I-STAT TROPONIN, ED: Troponin i, poc: 0.01 ng/mL (ref 0.00–0.08)

## 2017-01-22 NOTE — ED Triage Notes (Signed)
Pt presents with c/o chest pain. The pain began last night while he was at rest and has been constant since. He describes the pain as a throbbing pressure in the left side of his chest with radiation to his left shoulder. He reports weakness. He denies lightheadedness, fevers, cough, shortness of breath, nausea, vomiting.

## 2017-01-23 ENCOUNTER — Emergency Department (HOSPITAL_COMMUNITY)
Admission: EM | Admit: 2017-01-23 | Discharge: 2017-01-23 | Discharge status: Against Medical Advice | Payer: Self-pay | Attending: Emergency Medicine | Admitting: Emergency Medicine

## 2017-01-23 NOTE — ED Notes (Signed)
No response in lobby

## 2017-01-23 NOTE — ED Triage Notes (Signed)
Pt called in lobby, no response 

## 2017-02-12 ENCOUNTER — Other Ambulatory Visit: Payer: Self-pay | Admitting: Internal Medicine

## 2017-02-26 ENCOUNTER — Other Ambulatory Visit: Payer: Self-pay | Admitting: Internal Medicine

## 2017-02-26 DIAGNOSIS — I10 Essential (primary) hypertension: Secondary | ICD-10-CM

## 2017-03-21 ENCOUNTER — Other Ambulatory Visit: Payer: Self-pay | Admitting: Internal Medicine

## 2017-04-09 ENCOUNTER — Other Ambulatory Visit: Payer: Self-pay | Admitting: Internal Medicine

## 2017-04-09 DIAGNOSIS — I10 Essential (primary) hypertension: Secondary | ICD-10-CM

## 2017-04-10 ENCOUNTER — Other Ambulatory Visit: Payer: Self-pay | Admitting: Internal Medicine

## 2017-04-30 ENCOUNTER — Other Ambulatory Visit: Payer: Self-pay | Admitting: Internal Medicine

## 2017-04-30 DIAGNOSIS — I10 Essential (primary) hypertension: Secondary | ICD-10-CM

## 2017-05-02 ENCOUNTER — Other Ambulatory Visit: Payer: Self-pay | Admitting: Internal Medicine

## 2017-05-06 ENCOUNTER — Encounter (HOSPITAL_COMMUNITY): Payer: Self-pay | Admitting: Emergency Medicine

## 2017-05-06 ENCOUNTER — Emergency Department (HOSPITAL_COMMUNITY): Payer: Self-pay

## 2017-05-06 ENCOUNTER — Other Ambulatory Visit: Payer: Self-pay

## 2017-05-06 DIAGNOSIS — Z7982 Long term (current) use of aspirin: Secondary | ICD-10-CM | POA: Insufficient documentation

## 2017-05-06 DIAGNOSIS — I10 Essential (primary) hypertension: Secondary | ICD-10-CM | POA: Insufficient documentation

## 2017-05-06 DIAGNOSIS — Z87891 Personal history of nicotine dependence: Secondary | ICD-10-CM | POA: Insufficient documentation

## 2017-05-06 DIAGNOSIS — Z79899 Other long term (current) drug therapy: Secondary | ICD-10-CM | POA: Insufficient documentation

## 2017-05-06 DIAGNOSIS — R072 Precordial pain: Secondary | ICD-10-CM | POA: Insufficient documentation

## 2017-05-06 LAB — I-STAT TROPONIN, ED: TROPONIN I, POC: 0 ng/mL (ref 0.00–0.08)

## 2017-05-06 LAB — BASIC METABOLIC PANEL
ANION GAP: 12 (ref 5–15)
BUN: 7 mg/dL (ref 6–20)
CHLORIDE: 100 mmol/L — AB (ref 101–111)
CO2: 25 mmol/L (ref 22–32)
CREATININE: 0.7 mg/dL (ref 0.61–1.24)
Calcium: 9 mg/dL (ref 8.9–10.3)
Glucose, Bld: 101 mg/dL — ABNORMAL HIGH (ref 65–99)
Potassium: 3.4 mmol/L — ABNORMAL LOW (ref 3.5–5.1)
SODIUM: 137 mmol/L (ref 135–145)

## 2017-05-06 LAB — CBC
HCT: 36 % — ABNORMAL LOW (ref 39.0–52.0)
Hemoglobin: 12.3 g/dL — ABNORMAL LOW (ref 13.0–17.0)
MCH: 33.1 pg (ref 26.0–34.0)
MCHC: 34.2 g/dL (ref 30.0–36.0)
MCV: 96.8 fL (ref 78.0–100.0)
PLATELETS: 142 10*3/uL — AB (ref 150–400)
RBC: 3.72 MIL/uL — AB (ref 4.22–5.81)
RDW: 14.7 % (ref 11.5–15.5)
WBC: 3.5 10*3/uL — AB (ref 4.0–10.5)

## 2017-05-06 NOTE — ED Triage Notes (Signed)
Patient with chest pain that started this afternoon.  It initially was a discomfort,now it is a pain that radiates into his shoulder blades and it hurts when he takes a deep breath.  No shortness of breath, no nausea or vomiting.

## 2017-05-07 ENCOUNTER — Emergency Department (HOSPITAL_COMMUNITY): Payer: Self-pay

## 2017-05-07 ENCOUNTER — Encounter (HOSPITAL_COMMUNITY): Payer: Self-pay | Admitting: Internal Medicine

## 2017-05-07 ENCOUNTER — Inpatient Hospital Stay (HOSPITAL_COMMUNITY): Payer: Self-pay

## 2017-05-07 ENCOUNTER — Emergency Department (HOSPITAL_COMMUNITY)
Admission: EM | Admit: 2017-05-07 | Discharge: 2017-05-07 | Disposition: A | Discharge status: Home / Self Care | Payer: Self-pay | Attending: Emergency Medicine | Admitting: Emergency Medicine

## 2017-05-07 ENCOUNTER — Inpatient Hospital Stay (HOSPITAL_COMMUNITY)
Admission: EM | Admit: 2017-05-07 | Discharge: 2017-05-10 | DRG: 101 | Disposition: A | Discharge status: Home / Self Care | Payer: Self-pay | Attending: Internal Medicine | Admitting: Internal Medicine

## 2017-05-07 DIAGNOSIS — G9340 Encephalopathy, unspecified: Secondary | ICD-10-CM | POA: Diagnosis present

## 2017-05-07 DIAGNOSIS — Z23 Encounter for immunization: Secondary | ICD-10-CM

## 2017-05-07 DIAGNOSIS — Z79899 Other long term (current) drug therapy: Secondary | ICD-10-CM

## 2017-05-07 DIAGNOSIS — R569 Unspecified convulsions: Secondary | ICD-10-CM

## 2017-05-07 DIAGNOSIS — D696 Thrombocytopenia, unspecified: Secondary | ICD-10-CM | POA: Diagnosis present

## 2017-05-07 DIAGNOSIS — R072 Precordial pain: Secondary | ICD-10-CM

## 2017-05-07 DIAGNOSIS — M21969 Unspecified acquired deformity of unspecified lower leg: Secondary | ICD-10-CM

## 2017-05-07 DIAGNOSIS — G40409 Other generalized epilepsy and epileptic syndromes, not intractable, without status epilepticus: Principal | ICD-10-CM | POA: Diagnosis present

## 2017-05-07 DIAGNOSIS — R0902 Hypoxemia: Secondary | ICD-10-CM | POA: Diagnosis present

## 2017-05-07 DIAGNOSIS — F10939 Alcohol use, unspecified with withdrawal, unspecified: Secondary | ICD-10-CM

## 2017-05-07 DIAGNOSIS — E669 Obesity, unspecified: Secondary | ICD-10-CM | POA: Diagnosis present

## 2017-05-07 DIAGNOSIS — I10 Essential (primary) hypertension: Secondary | ICD-10-CM | POA: Diagnosis present

## 2017-05-07 DIAGNOSIS — F1010 Alcohol abuse, uncomplicated: Secondary | ICD-10-CM | POA: Diagnosis present

## 2017-05-07 DIAGNOSIS — Z87891 Personal history of nicotine dependence: Secondary | ICD-10-CM

## 2017-05-07 DIAGNOSIS — R739 Hyperglycemia, unspecified: Secondary | ICD-10-CM | POA: Diagnosis present

## 2017-05-07 DIAGNOSIS — Z6830 Body mass index (BMI) 30.0-30.9, adult: Secondary | ICD-10-CM

## 2017-05-07 DIAGNOSIS — F319 Bipolar disorder, unspecified: Secondary | ICD-10-CM | POA: Diagnosis present

## 2017-05-07 DIAGNOSIS — R079 Chest pain, unspecified: Secondary | ICD-10-CM | POA: Diagnosis present

## 2017-05-07 DIAGNOSIS — E7439 Other disorders of intestinal carbohydrate absorption: Secondary | ICD-10-CM | POA: Diagnosis present

## 2017-05-07 DIAGNOSIS — Z7982 Long term (current) use of aspirin: Secondary | ICD-10-CM

## 2017-05-07 DIAGNOSIS — E876 Hypokalemia: Secondary | ICD-10-CM | POA: Diagnosis present

## 2017-05-07 DIAGNOSIS — F10239 Alcohol dependence with withdrawal, unspecified: Secondary | ICD-10-CM | POA: Diagnosis present

## 2017-05-07 DIAGNOSIS — G9349 Other encephalopathy: Secondary | ICD-10-CM | POA: Diagnosis present

## 2017-05-07 DIAGNOSIS — D6959 Other secondary thrombocytopenia: Secondary | ICD-10-CM | POA: Diagnosis present

## 2017-05-07 DIAGNOSIS — E785 Hyperlipidemia, unspecified: Secondary | ICD-10-CM | POA: Diagnosis present

## 2017-05-07 DIAGNOSIS — Z8673 Personal history of transient ischemic attack (TIA), and cerebral infarction without residual deficits: Secondary | ICD-10-CM

## 2017-05-07 HISTORY — DX: Unspecified convulsions: R56.9

## 2017-05-07 HISTORY — DX: Alcohol abuse, uncomplicated: F10.10

## 2017-05-07 LAB — RAPID URINE DRUG SCREEN, HOSP PERFORMED
AMPHETAMINES: NOT DETECTED
BARBITURATES: NOT DETECTED
BENZODIAZEPINES: NOT DETECTED
Cocaine: NOT DETECTED
Opiates: POSITIVE — AB
Tetrahydrocannabinol: NOT DETECTED

## 2017-05-07 LAB — CBC WITH DIFFERENTIAL/PLATELET
BASOS PCT: 0 %
Basophils Absolute: 0 10*3/uL (ref 0.0–0.1)
EOS ABS: 0.1 10*3/uL (ref 0.0–0.7)
EOS PCT: 2 %
HCT: 36.4 % — ABNORMAL LOW (ref 39.0–52.0)
HEMOGLOBIN: 12.1 g/dL — AB (ref 13.0–17.0)
LYMPHS ABS: 1.2 10*3/uL (ref 0.7–4.0)
Lymphocytes Relative: 22 %
MCH: 33.2 pg (ref 26.0–34.0)
MCHC: 33.2 g/dL (ref 30.0–36.0)
MCV: 100 fL (ref 78.0–100.0)
MONO ABS: 0.6 10*3/uL (ref 0.1–1.0)
MONOS PCT: 11 %
NEUTROS PCT: 65 %
Neutro Abs: 3.5 10*3/uL (ref 1.7–7.7)
PLATELETS: 126 10*3/uL — AB (ref 150–400)
RBC: 3.64 MIL/uL — ABNORMAL LOW (ref 4.22–5.81)
RDW: 14.9 % (ref 11.5–15.5)
WBC: 5.3 10*3/uL (ref 4.0–10.5)

## 2017-05-07 LAB — CBG MONITORING, ED
GLUCOSE-CAPILLARY: 166 mg/dL — AB (ref 65–99)
Glucose-Capillary: 118 mg/dL — ABNORMAL HIGH (ref 65–99)

## 2017-05-07 LAB — COMPREHENSIVE METABOLIC PANEL
ALK PHOS: 46 U/L (ref 38–126)
ALT: 29 U/L (ref 17–63)
AST: 41 U/L (ref 15–41)
Albumin: 4.1 g/dL (ref 3.5–5.0)
Anion gap: 25 — ABNORMAL HIGH (ref 5–15)
BUN: 7 mg/dL (ref 6–20)
CALCIUM: 8.3 mg/dL — AB (ref 8.9–10.3)
CO2: 14 mmol/L — AB (ref 22–32)
CREATININE: 1.05 mg/dL (ref 0.61–1.24)
Chloride: 98 mmol/L — ABNORMAL LOW (ref 101–111)
GFR calc non Af Amer: >60 mL/min (ref >60)
Glucose, Bld: 191 mg/dL — ABNORMAL HIGH (ref 65–99)
Potassium: 3.2 mmol/L — ABNORMAL LOW (ref 3.5–5.1)
SODIUM: 137 mmol/L (ref 135–145)
Total Bilirubin: 0.8 mg/dL (ref 0.3–1.2)
Total Protein: 7.6 g/dL (ref 6.5–8.1)

## 2017-05-07 LAB — URINALYSIS, ROUTINE W REFLEX MICROSCOPIC
Bacteria, UA: NONE SEEN
Bilirubin Urine: NEGATIVE
GLUCOSE, UA: 150 mg/dL — AB
Ketones, ur: 5 mg/dL — AB
Leukocytes, UA: NEGATIVE
Nitrite: NEGATIVE
Protein, ur: NEGATIVE mg/dL
SPECIFIC GRAVITY, URINE: 1.016 (ref 1.005–1.030)
pH: 5 (ref 5.0–8.0)

## 2017-05-07 LAB — HEMOGLOBIN A1C
HEMOGLOBIN A1C: 6 % — AB (ref 4.8–5.6)
Mean Plasma Glucose: 125.5 mg/dL

## 2017-05-07 LAB — I-STAT TROPONIN, ED: Troponin i, poc: 0 ng/mL (ref 0.00–0.08)

## 2017-05-07 LAB — D-DIMER, QUANTITATIVE: D-Dimer, Quant: 0.73 ug/mL-FEU — ABNORMAL HIGH (ref 0.00–0.50)

## 2017-05-07 LAB — GLUCOSE, CAPILLARY: GLUCOSE-CAPILLARY: 105 mg/dL — AB (ref 65–99)

## 2017-05-07 LAB — TSH: TSH: 1.181 u[IU]/mL (ref 0.350–4.500)

## 2017-05-07 LAB — MAGNESIUM: MAGNESIUM: 1.3 mg/dL — AB (ref 1.7–2.4)

## 2017-05-07 LAB — ETHANOL: ALCOHOL ETHYL (B): 48 mg/dL — AB (ref <10)

## 2017-05-07 MED ORDER — AMLODIPINE BESYLATE 5 MG PO TABS: 5.0000 mg | ORAL_TABLET | Freq: Every day | ORAL | 0 refills | Status: DC

## 2017-05-07 MED ORDER — THIAMINE HCL 100 MG/ML IJ SOLN
100.0000 mg | Freq: Every day | INTRAMUSCULAR | Status: DC
  Administered 2017-05-07: 100 mg via INTRAVENOUS
  Filled 2017-05-07: qty 2

## 2017-05-07 MED ORDER — IOPAMIDOL (ISOVUE-370) INJECTION 76%
INTRAVENOUS | Status: AC
  Administered 2017-05-07: 100 mL
  Filled 2017-05-07: qty 100

## 2017-05-07 MED ORDER — ISOSORBIDE MONONITRATE ER 30 MG PO TB24: 30.0000 mg | ORAL_TABLET | Freq: Every day | ORAL | 0 refills | Status: DC

## 2017-05-07 MED ORDER — SODIUM CHLORIDE 0.9 % IV SOLN
INTRAVENOUS | Status: AC
  Administered 2017-05-07 – 2017-05-08 (×2): via INTRAVENOUS

## 2017-05-07 MED ORDER — LORAZEPAM 2 MG/ML IJ SOLN
2.0000 mg | Freq: Once | INTRAMUSCULAR | Status: AC
  Administered 2017-05-07: 2 mg via INTRAVENOUS

## 2017-05-07 MED ORDER — VITAMIN B-1 100 MG PO TABS
100.0000 mg | ORAL_TABLET | Freq: Every day | ORAL | Status: DC
  Administered 2017-05-08 – 2017-05-10 (×3): 100 mg via ORAL
  Filled 2017-05-07 (×3): qty 1

## 2017-05-07 MED ORDER — PHENYTOIN SODIUM 50 MG/ML IJ SOLN
1000.0000 mg | Freq: Once | INTRAMUSCULAR | Status: AC
  Administered 2017-05-07: 1000 mg via INTRAVENOUS
  Filled 2017-05-07: qty 20

## 2017-05-07 MED ORDER — FOLIC ACID 1 MG PO TABS
1.0000 mg | ORAL_TABLET | Freq: Every day | ORAL | Status: DC
  Administered 2017-05-07 – 2017-05-10 (×4): 1 mg via ORAL
  Filled 2017-05-07 (×4): qty 1

## 2017-05-07 MED ORDER — LORAZEPAM 2 MG/ML IJ SOLN
0.0000 mg | Freq: Four times a day (QID) | INTRAMUSCULAR | Status: AC
  Administered 2017-05-07: 2 mg via INTRAVENOUS
  Filled 2017-05-07 (×2): qty 1

## 2017-05-07 MED ORDER — HYDROCODONE-ACETAMINOPHEN 5-325 MG PO TABS
1.0000 | ORAL_TABLET | Freq: Once | ORAL | Status: AC
  Administered 2017-05-07: 1 via ORAL
  Filled 2017-05-07: qty 1

## 2017-05-07 MED ORDER — POTASSIUM CHLORIDE 10 MEQ/100ML IV SOLN
10.0000 meq | Freq: Once | INTRAVENOUS | Status: AC
  Administered 2017-05-07: 10 meq via INTRAVENOUS
  Filled 2017-05-07: qty 100

## 2017-05-07 MED ORDER — CARVEDILOL 25 MG PO TABS
25.0000 mg | ORAL_TABLET | Freq: Two times a day (BID) | ORAL | Status: DC
  Administered 2017-05-07: 25 mg via ORAL
  Filled 2017-05-07: qty 2

## 2017-05-07 MED ORDER — ATORVASTATIN CALCIUM 20 MG PO TABS
20.0000 mg | ORAL_TABLET | Freq: Every day | ORAL | Status: DC
  Administered 2017-05-07 – 2017-05-10 (×4): 20 mg via ORAL
  Filled 2017-05-07 (×4): qty 1

## 2017-05-07 MED ORDER — LORAZEPAM 1 MG PO TABS
0.0000 mg | ORAL_TABLET | Freq: Four times a day (QID) | ORAL | Status: AC
Start: 2017-05-07 — End: 2017-05-09
  Administered 2017-05-07 – 2017-05-08 (×2): 1 mg via ORAL
  Filled 2017-05-07: qty 1
  Filled 2017-05-07: qty 2
  Filled 2017-05-07 (×2): qty 1

## 2017-05-07 MED ORDER — LORAZEPAM 2 MG/ML IJ SOLN
0.0000 mg | Freq: Two times a day (BID) | INTRAMUSCULAR | Status: DC
Start: 2017-05-09 — End: 2017-05-11

## 2017-05-07 MED ORDER — ACETAMINOPHEN 650 MG RE SUPP: 650.0000 mg | Freq: Four times a day (QID) | RECTAL | Status: DC | PRN

## 2017-05-07 MED ORDER — ASPIRIN 81 MG PO CHEW
324.0000 mg | CHEWABLE_TABLET | Freq: Once | ORAL | Status: AC
  Administered 2017-05-07: 324 mg via ORAL
  Filled 2017-05-07: qty 4

## 2017-05-07 MED ORDER — LORAZEPAM 2 MG/ML IJ SOLN
2.0000 mg | Freq: Once | INTRAMUSCULAR | Status: AC
  Administered 2017-05-07: 2 mg via INTRAVENOUS
  Filled 2017-05-07: qty 1

## 2017-05-07 MED ORDER — INSULIN ASPART 100 UNIT/ML ~~LOC~~ SOLN
0.0000 [IU] | Freq: Every day | SUBCUTANEOUS | Status: DC
Start: 2017-05-07 — End: 2017-05-11

## 2017-05-07 MED ORDER — LORAZEPAM 1 MG PO TABS: 0.0000 mg | ORAL_TABLET | Freq: Two times a day (BID) | ORAL | Status: DC

## 2017-05-07 MED ORDER — SODIUM CHLORIDE 0.9 % IV SOLN
Freq: Once | INTRAVENOUS | Status: AC
  Administered 2017-05-07: 12:00:00 via INTRAVENOUS

## 2017-05-07 MED ORDER — ACETAMINOPHEN 325 MG PO TABS
650.0000 mg | ORAL_TABLET | Freq: Four times a day (QID) | ORAL | Status: DC | PRN
  Administered 2017-05-08 – 2017-05-10 (×3): 650 mg via ORAL
  Filled 2017-05-07 (×3): qty 2

## 2017-05-07 MED ORDER — ADULT MULTIVITAMIN W/MINERALS CH
1.0000 | ORAL_TABLET | Freq: Every day | ORAL | Status: DC
  Administered 2017-05-07 – 2017-05-10 (×4): 1 via ORAL
  Filled 2017-05-07 (×4): qty 1

## 2017-05-07 MED ORDER — DIAZEPAM 5 MG PO TABS
5.0000 mg | ORAL_TABLET | Freq: Once | ORAL | Status: AC
Start: 2017-05-07 — End: 2017-05-07
  Administered 2017-05-07: 5 mg via ORAL
  Filled 2017-05-07: qty 1

## 2017-05-07 MED ORDER — ALBUTEROL SULFATE (2.5 MG/3ML) 0.083% IN NEBU: 3.0000 mL | INHALATION_SOLUTION | Freq: Four times a day (QID) | RESPIRATORY_TRACT | Status: DC | PRN

## 2017-05-07 MED ORDER — ISOSORBIDE MONONITRATE ER 30 MG PO TB24: 30.0000 mg | ORAL_TABLET | Freq: Every day | ORAL | Status: DC

## 2017-05-07 MED ORDER — IRBESARTAN 300 MG PO TABS
300.0000 mg | ORAL_TABLET | Freq: Every day | ORAL | 0 refills | Status: DC
Start: 2017-05-07 — End: 2017-06-05

## 2017-05-07 MED ORDER — ONDANSETRON HCL 4 MG PO TABS: 4.0000 mg | ORAL_TABLET | Freq: Four times a day (QID) | ORAL | Status: DC | PRN

## 2017-05-07 MED ORDER — ONDANSETRON HCL 4 MG/2ML IJ SOLN: 4.0000 mg | Freq: Four times a day (QID) | INTRAMUSCULAR | Status: DC | PRN

## 2017-05-07 MED ORDER — LORAZEPAM 2 MG/ML IJ SOLN
1.0000 mg | Freq: Once | INTRAMUSCULAR | Status: DC
  Filled 2017-05-07: qty 1

## 2017-05-07 MED ORDER — CARVEDILOL 25 MG PO TABS: 25.0000 mg | ORAL_TABLET | Freq: Two times a day (BID) | ORAL | 0 refills | Status: DC

## 2017-05-07 MED ORDER — ENOXAPARIN SODIUM 40 MG/0.4ML ~~LOC~~ SOLN
40.0000 mg | SUBCUTANEOUS | Status: DC
  Administered 2017-05-07 – 2017-05-09 (×3): 40 mg via SUBCUTANEOUS
  Filled 2017-05-07 (×3): qty 0.4

## 2017-05-07 MED ORDER — INSULIN ASPART 100 UNIT/ML ~~LOC~~ SOLN
0.0000 [IU] | Freq: Three times a day (TID) | SUBCUTANEOUS | Status: DC
  Administered 2017-05-08 (×2): 1 [IU] via SUBCUTANEOUS
  Administered 2017-05-09: 2 [IU] via SUBCUTANEOUS

## 2017-05-07 NOTE — Discharge Instructions (Signed)

## 2017-05-07 NOTE — ED Notes (Signed)
EEG at bedside.

## 2017-05-07 NOTE — ED Notes (Signed)
Pt incontinent of urine. Sheets changed; fresh gown; condom cath applied.
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
## 2017-05-07 NOTE — ED Notes (Signed)
Urinal in between legs.

## 2017-05-07 NOTE — ED Notes (Signed)
Meal tray delivered.

## 2017-05-07 NOTE — Progress Notes (Signed)
EEG Completed; Results Pending  

## 2017-05-07 NOTE — ED Notes (Signed)
Pt CBG 166

## 2017-05-07 NOTE — Clinical Social Work Note (Signed)
Clinical Social Work Assessment  Patient Details  Name: Jamie Stewart MRN: [id number] Date of Birth: 07-25-1960  Date of referral:  05/07/17               Reason for consult:  Substance Use/ETOH Abuse                Permission sought to share information with:  Family Supports Permission granted to share information::  Yes, Verbal Permission Granted  Name::     Brother: Control and instrumentation engineer::     Relationship::     Contact Information:     Housing/Transportation Living arrangements for the past 2 months:  Single Family Home Source of Information:  Patient Patient Interpreter Needed:  None Criminal Activity/Legal Involvement Pertinent to Current Situation/Hospitalization:    Significant Relationships:  Siblings Lives with:  Siblings Do you feel safe going back to the place where you live?  Yes Need for family participation in patient care:  Yes (Comment)  Care giving concerns:  Concerns expressed about current alcohol abuse.    Social Worker assessment / plan:  CSW met with patient and pt's brother, Jamie Stewart, via bedside.  CSW discussed with pt current living situation. Pt informed CSW that he feels comfortable with returning to where he was living when medically cleared and discharged from the hospital. Pt's brother, Jamie Stewart, agreed with his brother returning to live with him. Pt admitted to alcohol abuse. Pt agreed to receiving substance abuse resources closer to discharge from the hospital. Plan: CSW on pt's assigned floor will follow up with pt closer to discharge about substance abuse resources.   Employment status:    Insurance information:    PT Recommendations:  Not assessed at this time Information / Referral to community resources:  Outpatient Substance Abuse Treatment Options, Residential Substance Abuse Treatment Options  Patient/Family's Response to care:  Pt and pt's brother, Jamie Stewart, were agreeable to pt's plan of care.   Patient/Family's Understanding of and Emotional Response to  Diagnosis, Current Treatment, and Prognosis:  No concerns or questions were presented to CSW at this time.   Emotional Assessment Appearance:  Appears stated age Attitude/Demeanor/Rapport:    Affect (typically observed):  Accepting, Quiet, Appropriate Orientation:  Oriented to Self, Oriented to Situation, Oriented to Place, Oriented to  Time Alcohol / Substance use:  Alcohol Use Psych involvement (Current and /or in the community):  No (Comment)  Discharge Needs  Concerns to be addressed:  Substance Abuse Concerns Readmission within the last 30 days:  No Current discharge risk:  Substance Abuse Barriers to Discharge:  Active Substance Use   Wendelyn Breslow, LCSW 05/07/2017, 2:18 PM
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
## 2017-05-07 NOTE — ED Notes (Signed)
Got patient on the monitor into a gown did ekg on sz pads

## 2017-05-07 NOTE — ED Triage Notes (Signed)
Pt had 2 witnessed seizures when getting out of car and into waiting room; placed in wheelchair and taken to room 37 where he had another seizure. MD at bedside; 2 mg ativan given at 1004. Pt altered, post itcal. Brother at bedside states he drinks a pint a day.
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
## 2017-05-07 NOTE — Consult Note (Signed)
Name: Jamie PinchRichard C Stewart MRN: [id number] DOB: 1960-09-10    ADMISSION DATE:  05/07/2017 CONSULTATION DATE:  05/07/17  REFERRING MD :  Dr. Rodena MedinMessick / EDP   CHIEF COMPLAINT:  Seizure    HISTORY OF PRESENT ILLNESS:  56 y/o M with PMH of HTN and ETOH abuse who was seen in the ER on 12/3 for chest pain. He was evaluated and released from the ER.  In the parking lot he developed seizures and his brother brought him back in for evaluation.   The patient was actively seizing on arrival to the ER.  He was documented as having tonic-clonic seizures.  He had approximately 4-5 seizures.  He was given 4 mg IV ativan with cessation of seizures.  He remained post-ictal.  He was also loaded with 1gm IV dilantin. His alcohol level was 44 on presentation (after being in the ER for almost 24 hours).  His brother reports he typically is seen drinking a least a "pint" of bourbon per day.  In addition, he will drink beer.    At baseline, he lives with his brother.  He is not married.  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  Non-smoker.    PCCM called for evaluation of patient.    PAST MEDICAL HISTORY :   has a past medical history of ANXIETY (07/22/2007), Asthmatic bronchitis (12/27/2013), BLEPHARITIS (02/24/2009), DEPRESSION (07/22/2007), GLUCOSE INTOLERANCE (07/22/2007), Heart murmur, HYPERLIPIDEMIA (07/22/2007), HYPERTENSION (07/22/2007), NEPHROLITHIASIS, HX OF (07/22/2007), PLANTAR FASCIITIS, RIGHT (11/25/2009), and SHOULDER PAIN, RIGHT (11/25/2009).   has a past surgical history that includes Hand surgery (Left) and hx of crush injury to right/hand fingers (2007).  Prior to Admission medications   Medication Sig Start Date End Date Taking? Authorizing Provider  albuterol (VENTOLIN HFA) 108 (90 Base) MCG/ACT inhaler Inhale 2 puffs into the lungs every 6 (six) hours as needed for wheezing or shortness of breath. 02/02/16   Corwin LevinsJohn, James W, MD  amLODipine (NORVASC) 5 MG tablet Take 1 tablet (5 mg total) by mouth  daily. 05/07/17   Zadie RhineWickline, Donald, MD  aspirin 81 MG chewable tablet Chew 81 mg by mouth daily.    [provider]  carvedilol (COREG) 25 MG tablet Take 1 tablet (25 mg total) by mouth 2 (two) times daily with a meal. 05/07/17   Zadie RhineWickline, Donald, MD  escitalopram (LEXAPRO) 10 MG tablet TAKE 1 TABLET (10 MG TOTAL) BY MOUTH DAILY. 02/02/16   Corwin LevinsJohn, James W, MD  irbesartan (AVAPRO) 300 MG tablet Take 1 tablet (300 mg total) by mouth daily. 05/07/17   Zadie RhineWickline, Donald, MD  isosorbide mononitrate (IMDUR) 30 MG 24 hr tablet Take 1 tablet (30 mg total) by mouth daily. 05/07/17   Zadie RhineWickline, Donald, MD  lansoprazole (PREVACID) 15 MG capsule Take 1 capsule (15 mg total) by mouth daily at 12 noon. 02/02/16   Corwin LevinsJohn, James W, MD  omega-3 acid ethyl esters (LOVAZA) 1 G capsule Take 1 g by mouth daily.    [provider]  simvastatin (ZOCOR) 20 MG tablet TAKE 1 TABLET (20 MG TOTAL) BY MOUTH DAILY AT 6 PM. 02/02/16   Corwin LevinsJohn, James W, MD  SUMAtriptan (IMITREX) 100 MG tablet Take 1 tablet (100 mg total) by mouth every 2 (two) hours as needed for migraine or headache. May repeat in 2 hours if headache persists or recurs. 02/02/16   Corwin LevinsJohn, James W, MD  VENTOLIN HFA 108 (90 Base) MCG/ACT inhaler INHALE 2 PUFFS INTO THE LUNGS EVERY 6 (SIX) HOURS AS NEEDED FOR WHEEZING  OR SHORTNESS OF BREATH. Patient not taking: Reported on 05/06/2017 10/30/16   Corwin Levins, MD  vitamin B-12 (CYANOCOBALAMIN) 1000 MCG tablet Take 1,000 mcg by mouth daily.    [provider]    No Known Allergies  FAMILY HISTORY:  family history includes Alcohol abuse in his other; Cancer in his father; Diabetes in his brother, other, and sister; Heart attack (age of onset: 70) in his sister; Heart disease in his other; Hyperlipidemia in his other; Hypertension in his other; Sarcoidosis in his brother; Stomach cancer in his brother.  SOCIAL HISTORY:  reports that he has quit smoking. he has never used smokeless tobacco. He reports that he  drinks alcohol. He reports that he does not use drugs.  REVIEW OF SYSTEMS:  Unable to complete as patient is post-ictal  SUBJECTIVE:   VITAL SIGNS: Temp:  [98 F (36.7 C)-99.5 F (37.5 C)] 99.5 F (37.5 C) (12/04 1022) Pulse Rate:  [63-143] 101 (12/04 1015) Resp:  [12-32] 29 (12/04 1015) BP: (137-212)/(94-127) 137/96 (12/04 1015) SpO2:  [69 %-97 %] 93 % (12/04 1015) Weight:  [215 lb (97.5 kg)] 215 lb (97.5 kg) (12/03 1913)  PHYSICAL EXAMINATION: General: adult male in NAD, lying in bed.  Brother at bedside.  HEENT: MM pink/moist, no jvd PSY: calm Neuro: Awake, alert, oriented to self, place.  Unable to state the year.  Follows simple commands and drifts back to sleep.  MAE. No tremor noted.   CV: s1s2 rrr, SR 80-90's on monitor, no m/r/g PULM: even/non-labored, lungs bilaterally clear ZO:XWRU, non-tender, bsx4 active  Extremities: warm/dry, no edema  Skin: no rashes or lesions   Recent Labs  Lab 05/06/17 1931 05/07/17 1044  NA 137 137  K 3.4* 3.2*  CL 100* 98*  CO2 25 14*  BUN 7 7  CREATININE 0.70 1.05  GLUCOSE 101* 191*    Recent Labs  Lab 05/06/17 1931 05/07/17 1044  HGB 12.3* 12.1*  HCT 36.0* 36.4*  WBC 3.5* 5.3  PLT 142* 126*    Dg Chest 2 View  Result Date: 05/06/2017 CLINICAL DATA:  Worsening left shoulder and chest pain. EXAM: CHEST  2 VIEW COMPARISON:  01/22/2017 FINDINGS: Cardiomediastinal silhouette is normal. Mediastinal contours appear intact. Tortuosity of the aorta. There is no evidence of focal airspace consolidation, pleural effusion or pneumothorax. Osseous structures are without acute abnormality. Soft tissues are grossly normal. IMPRESSION: Tortuosity of the aorta. Otherwise no radiographic evidence of acute cardiopulmonary disease. Electronically Signed   By: Ted Mcalpine M.D.   On: 05/06/2017 20:05   Ct Head Wo Contrast  Result Date: 05/07/2017 CLINICAL DATA:  Seizure, nontraumatic.  Ethanol withdrawal. EXAM: CT HEAD WITHOUT  CONTRAST TECHNIQUE: Contiguous axial images were obtained from the base of the skull through the vertex without intravenous contrast. COMPARISON:  09/24/2013 FINDINGS: Brain: No evidence of acute infarction, hemorrhage, hydrocephalus, extra-axial collection or mass lesion/mass effect. Moderate area of encephalomalacia in the inferior right frontal lobe that is stable from 2015. This could be post ischemic or posttraumatic in this location. Mild white matter disease was also seen on prior, likely microvascular ischemic given patient's medical history. Age advanced cerebral volume loss. Vascular: Mild arterial calcification.  No hyperdense vessel. Skull: No acute or aggressive finding. Sinuses/Orbits: Polypoid densities in the bilateral nasal cavity IMPRESSION: 1. No acute finding. 2. Chronic moderate right inferior frontal encephalomalacia that involves cortex. 3. Mild chronic white matter disease. 4. Chronic bilateral nasal cavity polyps. Electronically Signed   By: Kathrynn Ducking.D.  On: 05/07/2017 11:20   Ct Angio Chest Pe W And/or Wo Contrast  Result Date: 05/07/2017 CLINICAL DATA:  Chest pain that radiates into the shoulder blades since yesterday. Pulmonary embolism suspected. EXAM: CT ANGIOGRAPHY CHEST WITH CONTRAST TECHNIQUE: Multidetector CT imaging of the chest was performed using the standard protocol during bolus administration of intravenous contrast. Multiplanar CT image reconstructions and MIPs were obtained to evaluate the vascular anatomy. CONTRAST:  100mL ISOVUE-370 IOPAMIDOL (ISOVUE-370) INJECTION 76% COMPARISON:  12/07/2013 FINDINGS: Cardiovascular: Satisfactory opacification of the pulmonary arteries to the segmental level. No evidence of pulmonary embolism. No cardiomegaly. No pericardial effusion. Tortuous aorta with ductus bump. Wall thickening of the descending aorta is stable from prior and attributed atherosclerosis. Mediastinum/Nodes: Negative for adenopathy or mass. Lungs/Pleura:  Lungs are clear. No pleural effusion or pneumothorax. Upper Abdomen: No explanation for pain. Musculoskeletal: No acute or aggressive finding Review of the MIP images confirms the above findings. IMPRESSION: Negative for pulmonary embolism or other acute finding. Electronically Signed   By: Marnee SpringJonathon  Watts M.D.   On: 05/07/2017 06:45    SIGNIFICANT EVENTS  12/03  Seen in ER for chest pain, discharged 12/04  Returned to ER after having seizure in the ER parking lot  STUDIES:  CT head 12/4 >> no acute findings, chronic moderate R frontal encephalomalacia, mild chronic white matter disease    ASSESSMENT / PLAN:  Discussion: 56 y/o M seen in ER on 12/3 for chest pain and released.  He was leaving the ER and developed seizures.  Treated with 4mg  IV ativan / dilantin with resolution of seizure.  CT head negative for acute process.   Seizure - suspect ETOH related  ETOH Abuse   Plan: Patient appears stable, is protecting airway.  No further seizures.  Monitor on SDU as he is high risk for development of withdrawal symptoms  SDU CIWA protocol, consider liberalizing protocol ativan to 2-4 mg PRN Monitor airway protection  Seizure precautions  Assess EEG  Continue dilantin  Assess TSH  Cardiac monitoring    HTN  Plan: Per primary service    Hypokalemia  Plan: Trend BMP / urinary output Replace electrolytes as indicated Avoid nephrotoxic agents, ensure adequate renal perfusion   PCCM will be available PRN.  Please call if patients condition changes.    Canary BrimBrandi Ollis, NP-C  Pulmonary & Critical Care Pgr: [phone] or if no answer [phone] 05/07/2017, 12:11 PM  Attending Note:  56 year old male alcoholic [facility], gets discharged this morning and has an alcohol withdraw seizure in the parking lot.  Patient was brought back to the ED where he had 3 more seizure that were controled with dilantin and ativan.  On exam, patient is not tremulous or  tachy but has just gotten ativan.  I reviewed CXR myself, no acute disease noted.  Discussed with EDP and PCCM-NP.    Seizure: it is very usual to have etoh withdraw seizure with etoh level as in here.  Wonder if there is a primary cause.  - Neuro consult  - CIWA  - Dilantin  - Seizure precautions  - Ok to admit to SDU via TR[facility]  Etoh abuse:  - CIWA protocol  - Thiamine, folate and MVI  - Ativan  - Avoid over sedation  Hypoxemia:  - Titrate O2 for sat of 88-92%  HTN:  - Wait til seizure are fully controlled  - Home medications  Acute encephalopathy: post ictal  - Minimize sedation  - Monitor  PCCM will sign off, we are aware of patient, if withdrawal becomes worse then please call PCCM back.  Patient seen and examined, agree with above note.  I dictated the care and orders written for this patient under my direction.  Alyson Reedy, MD [phone]

## 2017-05-07 NOTE — Procedures (Signed)
ELECTROENCEPHALOGRAM REPORT  Date of Study: 05/07/17  Patient's Name: Jamie Stewart MRN: [id number] Date of Birth: 07-08-1960  Referring Provider: Toya SmothersKaren Black, NP  Clinical History: Jamie Stewart is a 56 y.o.  M with PMH of HTN and ETOH abuse who was seen in the ER on 12/3 for chest pain. He was evaluated and released from the ER.  In the parking lot he developed seizures and his brother brought him back in for evaluation.  The patient was actively seizing on arrival to the ER.  He was documented as having tonic-clonic seizures.  He had approximately 4-5 seizures.  He was given 4 mg IV ativan with cessation of seizures.  He remained post-ictal.  He was also loaded with 1gm IV dilantin. His alcohol level was 44 on presentation (after being in the ER for almost 24 hours).  His brother reports he typically is seen drinking a least a "pint" of bourbon per day.  In addition, he will drink beer.      Medications: Scheduled Meds: . atorvastatin  20 mg Oral q1800  . carvedilol  25 mg Oral BID WC  . enoxaparin (LOVENOX) injection  40 mg Subcutaneous Q24H  . folic acid  1 mg Oral Daily  . insulin aspart  0-5 Units Subcutaneous QHS  . insulin aspart  0-9 Units Subcutaneous TID WC  . [START ON 05/08/2017] isosorbide mononitrate  30 mg Oral Daily  . LORazepam  0-4 mg Intravenous Q6H   Or  . LORazepam  0-4 mg Oral Q6H  . [START ON 05/09/2017] LORazepam  0-4 mg Intravenous Q12H   Or  . [START ON 05/09/2017] LORazepam  0-4 mg Oral Q12H  . multivitamin with minerals  1 tablet Oral Daily  . thiamine  100 mg Oral Daily   Or  . thiamine  100 mg Intravenous Daily   Continuous Infusions: . sodium chloride 125 mL/hr at 05/07/17 1608   PRN Meds:.acetaminophen **OR** acetaminophen, albuterol, ondansetron **OR** ondansetron (ZOFRAN) IV            Technical Summary: This is a standard 16 channel EEG recording performed according to the international 10-20 electrode system.  AP bipolar, transverse  bipolar, and referential montages were obtained, and digitally reformatted as necessary.  Duration of Tracing: 21:32  Description: In the awake state there is a 9 Hz alpha rhythm seen from the posterior head regions in a symmetric fashion.  Drowsiness is noted by dropout of background alpha and vertex slowing, however no definite stage II sleep was noted.   Photic stimulation was performed without notable changes.  HV was not performed.  EKG was monitored and noted to be sinus rhythym with an average heart rate of 90 bpm.  No epileptiform changes were noted.  Impression: This is a normal EEG in the awake and drowsy states without any epileptiform abnormalities noted.  A single normal EEG does not exclude the diagnosis of epilepsy.  Clinical correlation advised.   Beryle Beamsobert Markesia Crilly, M.D.

## 2017-05-08 ENCOUNTER — Encounter (HOSPITAL_COMMUNITY): Payer: Self-pay

## 2017-05-08 ENCOUNTER — Other Ambulatory Visit: Payer: Self-pay

## 2017-05-08 ENCOUNTER — Inpatient Hospital Stay (HOSPITAL_COMMUNITY): Payer: Self-pay

## 2017-05-08 LAB — CBC
HEMATOCRIT: 38.7 % — AB (ref 39.0–52.0)
HEMOGLOBIN: 12.9 g/dL — AB (ref 13.0–17.0)
MCH: 32.7 pg (ref 26.0–34.0)
MCHC: 33.3 g/dL (ref 30.0–36.0)
MCV: 98.2 fL (ref 78.0–100.0)
PLATELETS: 143 10*3/uL — AB (ref 150–400)
RBC: 3.94 MIL/uL — AB (ref 4.22–5.81)
RDW: 14.7 % (ref 11.5–15.5)
WBC: 6.3 10*3/uL (ref 4.0–10.5)

## 2017-05-08 LAB — GLUCOSE, CAPILLARY
Glucose-Capillary: 125 mg/dL — ABNORMAL HIGH (ref 65–99)
Glucose-Capillary: 150 mg/dL — ABNORMAL HIGH (ref 65–99)
Glucose-Capillary: 108 mg/dL — ABNORMAL HIGH (ref 65–99)
Glucose-Capillary: 107 mg/dL — ABNORMAL HIGH (ref 65–99)

## 2017-05-08 LAB — COMPREHENSIVE METABOLIC PANEL
ALK PHOS: 42 U/L (ref 38–126)
ALT: 25 U/L (ref 17–63)
ANION GAP: 12 (ref 5–15)
AST: 38 U/L (ref 15–41)
Albumin: 3.9 g/dL (ref 3.5–5.0)
BUN: 5 mg/dL — ABNORMAL LOW (ref 6–20)
CO2: 25 mmol/L (ref 22–32)
Calcium: 8.2 mg/dL — ABNORMAL LOW (ref 8.9–10.3)
Chloride: 97 mmol/L — ABNORMAL LOW (ref 101–111)
Creatinine, Ser: 0.79 mg/dL (ref 0.61–1.24)
GFR calc non Af Amer: >60 mL/min (ref >60)
Glucose, Bld: 131 mg/dL — ABNORMAL HIGH (ref 65–99)
Potassium: 2.9 mmol/L — ABNORMAL LOW (ref 3.5–5.1)
SODIUM: 134 mmol/L — AB (ref 135–145)
Total Bilirubin: 0.7 mg/dL (ref 0.3–1.2)
Total Protein: 7.7 g/dL (ref 6.5–8.1)

## 2017-05-08 LAB — HIV ANTIBODY (ROUTINE TESTING W REFLEX): HIV Screen 4th Generation wRfx: NONREACTIVE

## 2017-05-08 LAB — MRSA PCR SCREENING: MRSA by PCR: NEGATIVE

## 2017-05-08 MED ORDER — POTASSIUM CHLORIDE CRYS ER 20 MEQ PO TBCR
40.0000 meq | EXTENDED_RELEASE_TABLET | Freq: Three times a day (TID) | ORAL | Status: DC
  Administered 2017-05-08 – 2017-05-09 (×4): 40 meq via ORAL
  Filled 2017-05-08 (×4): qty 2

## 2017-05-08 MED ORDER — ISOSORBIDE MONONITRATE ER 30 MG PO TB24
30.0000 mg | ORAL_TABLET | Freq: Every day | ORAL | Status: DC
  Administered 2017-05-08: 30 mg via ORAL
  Filled 2017-05-08: qty 1

## 2017-05-08 MED ORDER — HYDROCODONE-ACETAMINOPHEN 5-325 MG PO TABS
1.0000 | ORAL_TABLET | ORAL | Status: DC | PRN
Start: 2017-05-08 — End: 2017-05-11
  Administered 2017-05-08: 2 via ORAL
  Administered 2017-05-09 – 2017-05-10 (×3): 1 via ORAL
  Filled 2017-05-08: qty 2
  Filled 2017-05-08 (×3): qty 1

## 2017-05-08 MED ORDER — PNEUMOCOCCAL VAC POLYVALENT 25 MCG/0.5ML IJ INJ
0.5000 mL | INJECTION | INTRAMUSCULAR | Status: AC
Start: 2017-05-09 — End: 2017-05-10
  Administered 2017-05-10: 0.5 mL via INTRAMUSCULAR
  Filled 2017-05-08: qty 0.5

## 2017-05-08 MED ORDER — IRBESARTAN 300 MG PO TABS
300.0000 mg | ORAL_TABLET | Freq: Every day | ORAL | Status: DC
  Administered 2017-05-08 – 2017-05-10 (×3): 300 mg via ORAL
  Filled 2017-05-08: qty 1
  Filled 2017-05-08: qty 2
  Filled 2017-05-08: qty 1

## 2017-05-08 MED ORDER — ASPIRIN 81 MG PO CHEW
81.0000 mg | CHEWABLE_TABLET | Freq: Every day | ORAL | Status: DC
  Administered 2017-05-08 – 2017-05-10 (×3): 81 mg via ORAL
  Filled 2017-05-08 (×3): qty 1

## 2017-05-08 MED ORDER — MAGNESIUM OXIDE 400 (241.3 MG) MG PO TABS
800.0000 mg | ORAL_TABLET | Freq: Two times a day (BID) | ORAL | Status: DC
  Administered 2017-05-08 – 2017-05-10 (×5): 800 mg via ORAL
  Filled 2017-05-08 (×5): qty 2

## 2017-05-08 MED ORDER — AMLODIPINE BESYLATE 5 MG PO TABS
5.0000 mg | ORAL_TABLET | Freq: Every day | ORAL | Status: DC
  Administered 2017-05-08 – 2017-05-10 (×3): 5 mg via ORAL
  Filled 2017-05-08 (×3): qty 1

## 2017-05-08 MED ORDER — AMLODIPINE BESYLATE 5 MG PO TABS
5.0000 mg | ORAL_TABLET | Freq: Every day | ORAL | Status: DC
  Administered 2017-05-08: 5 mg via ORAL
  Filled 2017-05-08: qty 1

## 2017-05-08 MED ORDER — INFLUENZA VAC SPLIT QUAD 0.5 ML IM SUSY
0.5000 mL | PREFILLED_SYRINGE | INTRAMUSCULAR | Status: AC
  Administered 2017-05-10: 0.5 mL via INTRAMUSCULAR
  Filled 2017-05-08: qty 0.5

## 2017-05-08 MED ORDER — ISOSORBIDE MONONITRATE ER 30 MG PO TB24
30.0000 mg | ORAL_TABLET | Freq: Every day | ORAL | Status: DC
  Administered 2017-05-08 – 2017-05-10 (×3): 30 mg via ORAL
  Filled 2017-05-08 (×3): qty 1

## 2017-05-08 MED ORDER — LEVETIRACETAM 500 MG PO TABS
500.0000 mg | ORAL_TABLET | Freq: Two times a day (BID) | ORAL | Status: DC
  Administered 2017-05-08 – 2017-05-09 (×3): 500 mg via ORAL
  Filled 2017-05-08 (×3): qty 1

## 2017-05-08 MED ORDER — CARVEDILOL 25 MG PO TABS
25.0000 mg | ORAL_TABLET | Freq: Two times a day (BID) | ORAL | Status: DC
Start: 2017-05-08 — End: 2017-05-11
  Administered 2017-05-08 – 2017-05-10 (×6): 25 mg via ORAL
  Filled 2017-05-08 (×6): qty 1

## 2017-05-08 NOTE — Plan of Care (Signed)
Care plan initiated.

## 2017-05-08 NOTE — Progress Notes (Signed)
PROGRESS NOTE    Jamie HUNNICUTT  ZOX:[id number] DOB: 1961/01/27 DOA: 05/07/2017 PCP: Corwin Levins, MD   Specialists:     Brief Narrative:  56 y/o ? EtOH Obesity HTN-previous hypertensive encephalopathy-stress test 4/15 = no scar, negative for ischemia-EF 50-55% at the time HLD Glucose intolerance Bipolar  Admitted with chest pain earlier in the day on 12/4-CT angiogram performed-was released from the emergency room after this evaluation and then had 4-5 clonic tonic seizures in the emergency room resolving in about 2 minutes Patient usually drinks 1 pint of bourbon and had not drank since then [facility] M consult requested by ED-neurology consulted-CIWA protocol started started on Dilantin   Assessment & Plan:   Principal Problem:   Seizure (HCC) Active Problems:   Essential hypertension   Acute encephalopathy   ETOH abuse   Hypokalemia   Thrombocytopenia (HCC)   Hyperglycemia   Seizures and fall Neurology consulted although I do not see formal note--received 6 mg of Ativan and thousand milligrams of Dilantin EEG performed shows normal EEG Awaiting neurology input as I am unclear antiepileptic meds as an outpatient with episodic seizures whether he would benefit from continued  As he is complaining of some pain on the dorsum of the foot we will get an x-ray to rule out an occult fracture doing well ate a full breakfast no nausea tells me having  Alcoholism chronic CIWA protocol ranging between 2 and 5-keep As he has not had a further seizure and seems very calm and appropriate I think he can be managed onMedSurg His platelets are low probably because of chronic ethanol use  Prior chest pain, hypertensive urgency with negative stress test 2015 Point-of-care troponin x2-no further inpatient workup at this time  his pain does not seem cardiac in nature-it seems more musculoskeletal  Hypomagnesemia, hypokalemia-both related to each other and probably secondary to chronic  potomania Replace potassium 40 mEq twice daily, will give 800 mg of magnesium 2 times daily orally and recheck labs in a.m.  Hypertension Resume Avapro 300, Imdur 30, amlodipine 5, Coreg 25 twice daily  Hyperlipidemia Continue atorvastatin 20 daily  Impaired glucose tolerance Blood sugar about 125 average on labs-monitor  Body mass index is 30.24 kg/m. Outpatient counseling regarding weight loss once stabilized   DVT prophylaxis: SCDs Code Status: Full code presumed Family Communication: No family present Disposition Plan: Inpatient stepdown   Consultants:   CCM  Neurology curbside  Procedures:   EEG 12/3  Antimicrobials:   Jamie Stewart   Subjective: foot pain in addition to continued back pain eating and drinking without deficit doing well or abdominal pain States that the pain is in his back and radiates to the chest   Objective: Vitals:   05/07/17 2300 05/07/17 2309 05/08/17 0350 05/08/17 0443  BP: (!) 137/98 (!) 137/98 (!) 156/113 (!) 166/113  Pulse:  87 80   Resp:  20 19   Temp:  98.8 F (37.1 C) 99.4 F (37.4 C)   TempSrc:  Oral Oral   SpO2:  96% 97%   Weight:      Height:        Intake/Output Summary (Last 24 hours) at 05/08/2017 0738 Last data filed at 05/08/2017 0353 Gross per 24 hour  Intake -  Output 2550 ml  Net -2550 ml   Filed Weights   05/07/17 2047  Weight: 92.9 kg (204 lb 12.9 oz)    Examination:  Alert pleasant oriented no distress tolerating diet Not anxious no tremulousness Chest is  clinically clear  range of motion of left shoulder able to laterally extend and flex and has point tenderness in the subscapular and trapezius latissimus muscles Abdomen is soft nontender nondistended no rebound Left foot has some tenderness over the dorsum at the flexor digitorum tendon but he is able to plantar dorsiflex he has good sensation otherwise smile is symmetric No icterus no pallor No JVD S1-S2 no murmur rub or gallop   Data  Reviewed: I have personally reviewed following labs and imaging studies  CBC: Recent Labs  Lab 05/06/17 1931 05/07/17 1044 05/08/17 0203  WBC 3.5* 5.3 6.3  NEUTROABS  --  3.5  --   HGB 12.3* 12.1* 12.9*  HCT 36.0* 36.4* 38.7*  MCV 96.8 100.0 98.2  PLT 142* 126* 143*   Basic Metabolic Panel: Recent Labs  Lab 05/06/17 1931 05/07/17 1044 05/07/17 1233 05/08/17 0203  NA 137 137  --  134*  K 3.4* 3.2*  --  2.9*  CL 100* 98*  --  97*  CO2 25 14*  --  25  GLUCOSE 101* 191*  --  131*  BUN 7 7  --  5*  CREATININE 0.70 1.05  --  0.79  CALCIUM 9.0 8.3*  --  8.2*  MG  --   --  1.3*  --    GFR: Estimated Creatinine Clearance: 118.8 mL/min (by C-G formula based on SCr of 0.79 mg/dL). Liver Function Tests: Recent Labs  Lab 05/07/17 1044 05/08/17 0203  AST 41 38  ALT 29 25  ALKPHOS 46 42  BILITOT 0.8 0.7  PROT 7.6 7.7  ALBUMIN 4.1 3.9   No results for input(s): LIPASE, AMYLASE in the last 168 hours. No results for input(s): AMMONIA in the last 168 hours. Coagulation Profile: No results for input(s): INR, PROTIME in the last 168 hours. Cardiac Enzymes: No results for input(s): CKTOTAL, CKMB, CKMBINDEX, TROPONINI in the last 168 hours. BNP (last 3 results) No results for input(s): PROBNP in the last 8760 hours. HbA1C: Recent Labs    05/07/17 1044  HGBA1C 6.0*   CBG: Recent Labs  Lab 05/07/17 1214 05/07/17 1750 05/07/17 2125  GLUCAP 166* 118* 105*   Lipid Profile: No results for input(s): CHOL, HDL, LDLCALC, TRIG, CHOLHDL, LDLDIRECT in the last 72 hours. Thyroid Function Tests: Recent Labs    05/07/17 1300  TSH 1.181   Anemia Panel: No results for input(s): VITAMINB12, FOLATE, FERRITIN, TIBC, IRON, RETICCTPCT in the last 72 hours. Urine analysis:    Component Value Date/Time   COLORURINE STRAW (A) 05/07/2017 1205   APPEARANCEUR CLEAR 05/07/2017 1205   LABSPEC 1.016 05/07/2017 1205   PHURINE 5.0 05/07/2017 1205   GLUCOSEU 150 (A) 05/07/2017 1205    GLUCOSEU NEGATIVE 10/15/2014 1612   HGBUR SMALL (A) 05/07/2017 1205   BILIRUBINUR NEGATIVE 05/07/2017 1205   KETONESUR 5 (A) 05/07/2017 1205   PROTEINUR NEGATIVE 05/07/2017 1205   UROBILINOGEN 0.2 10/15/2014 1612   NITRITE NEGATIVE 05/07/2017 1205   LEUKOCYTESUR NEGATIVE 05/07/2017 1205     Radiology Studies: Reviewed images personally in health database    Scheduled Meds: . amLODipine  5 mg Oral Daily  . atorvastatin  20 mg Oral q1800  . carvedilol  25 mg Oral BID WC  . enoxaparin (LOVENOX) injection  40 mg Subcutaneous Q24H  . folic acid  1 mg Oral Daily  . insulin aspart  0-5 Units Subcutaneous QHS  . insulin aspart  0-9 Units Subcutaneous TID WC  . isosorbide mononitrate  30 mg  Oral Daily  . LORazepam  0-4 mg Intravenous Q6H   Or  . LORazepam  0-4 mg Oral Q6H  . [START ON 05/09/2017] LORazepam  0-4 mg Intravenous Q12H   Or  . [START ON 05/09/2017] LORazepam  0-4 mg Oral Q12H  . multivitamin with minerals  1 tablet Oral Daily  . thiamine  100 mg Oral Daily   Or  . thiamine  100 mg Intravenous Daily   Continuous Infusions: . sodium chloride 125 mL/hr at 05/07/17 1608     LOS: 1 day    Time spent: 25    Pleas KochJai Andree Golphin, MD [facility] (P) [phone]   If 7PM-7AM, please contact night-coverage [email or web address] Password [email or web address] 05/08/2017, 7:38 AM

## 2017-05-09 DIAGNOSIS — R739 Hyperglycemia, unspecified: Secondary | ICD-10-CM

## 2017-05-09 DIAGNOSIS — E876 Hypokalemia: Secondary | ICD-10-CM

## 2017-05-09 LAB — RENAL FUNCTION PANEL
ANION GAP: 10 (ref 5–15)
Albumin: 3.6 g/dL (ref 3.5–5.0)
BUN: 5 mg/dL — ABNORMAL LOW (ref 6–20)
CHLORIDE: 102 mmol/L (ref 101–111)
CO2: 24 mmol/L (ref 22–32)
Calcium: 8.3 mg/dL — ABNORMAL LOW (ref 8.9–10.3)
Creatinine, Ser: 0.72 mg/dL (ref 0.61–1.24)
GFR calc non Af Amer: >60 mL/min (ref >60)
Glucose, Bld: 106 mg/dL — ABNORMAL HIGH (ref 65–99)
Phosphorus: 2.3 mg/dL — ABNORMAL LOW (ref 2.5–4.6)
Potassium: 3.4 mmol/L — ABNORMAL LOW (ref 3.5–5.1)
Sodium: 136 mmol/L (ref 135–145)

## 2017-05-09 LAB — GLUCOSE, CAPILLARY
GLUCOSE-CAPILLARY: 100 mg/dL — AB (ref 65–99)
GLUCOSE-CAPILLARY: 111 mg/dL — AB (ref 65–99)
GLUCOSE-CAPILLARY: 161 mg/dL — AB (ref 65–99)
GLUCOSE-CAPILLARY: 97 mg/dL (ref 65–99)

## 2017-05-09 LAB — MAGNESIUM: Magnesium: 1.7 mg/dL (ref 1.7–2.4)

## 2017-05-09 MED ORDER — LEVETIRACETAM 500 MG PO TABS
500.0000 mg | ORAL_TABLET | Freq: Two times a day (BID) | ORAL | Status: DC
  Administered 2017-05-09 – 2017-05-10 (×2): 500 mg via ORAL
  Filled 2017-05-09 (×2): qty 1

## 2017-05-09 MED ORDER — POTASSIUM CHLORIDE CRYS ER 20 MEQ PO TBCR
40.0000 meq | EXTENDED_RELEASE_TABLET | Freq: Two times a day (BID) | ORAL | Status: AC
  Administered 2017-05-09 (×2): 40 meq via ORAL
  Filled 2017-05-09 (×3): qty 2

## 2017-05-09 MED ORDER — MAGNESIUM OXIDE 400 (241.3 MG) MG PO TABS
400.0000 mg | ORAL_TABLET | Freq: Two times a day (BID) | ORAL | Status: AC
  Administered 2017-05-09: 400 mg via ORAL
  Filled 2017-05-09: qty 1

## 2017-05-09 NOTE — Progress Notes (Signed)
[facility] PROGRESS NOTE    Progress Note  Jamie PinchRichard C Garverick  ION:[id number] DOB: 06/21/1960 DOA: 05/07/2017 PCP: Corwin LevinsJohn, James W, MD     Brief Narrative:   Jamie Stewart is an 56 y.o. male past medical history of essential hypertension, hyperlipidemia bipolar disorder, alcohol abuse who came into the emergency room for 4-5 episodes of tonic-clonic seizure episodes  Assessment/Plan:   Acute encephalopathy likely due to Seizure [facility](HCC): In the ED received several doses of Ativan and seizure activity ceased. EEG was performed that showed no seizure activity neurology was consulted. Spoke with the neurologist will start him on  Keppra 500mg  a day, monitor for an additional 24 hours can be discharged in the morning. Please remember to explained to the patient that he cannot drive or operate heavy machinery for the next 6 months. Neurology is getting him a follow-up appointment.  Alcohol abuse: Continue to monitor with Ativan protocol He has had no further seizures while in-house.  Thrombocytopenia: Likely due to alcohol abuse.  Chest pain: With a negative stress test in 2015.  Hypomagnesemia/hypokalemia: Probably due to hypokalemia replete orally and recheck  Body mass index is 30.24 kg/m. Outpatient counseling regarding weight loss once stabilized  Essential hypertension Continue current home regimen.  Blood pressure seems to be controlled.   DVT prophylaxis: Lovenox Family CommunicationNone Disposition Plan/Barrier to D/C: home in am monitor for 24 hr. Code Status:     Code Status Orders  (From admission, onward)        Start     Ordered   05/07/17 1234  Full code  Continuous     05/07/17 1236    Code Status History    Date Active Date Inactive Code Status Order ID Comments User Context   05/07/2017 10:49 05/07/2017 12:36 Full Code [id number]  Wynetta FinesMessick, Peter C, MD ED   09/25/2013 00:47 09/27/2013 20:55 Full Code [id number]  Vassie LollMadera, Carlos, MD Inpatient         IV Access:    Peripheral IV   Procedures and diagnostic studies:   Dg Foot 2 Views Left  Result Date: 05/08/2017 CLINICAL DATA:  56 year old male with a history of foot pain EXAM: LEFT FOOT - 2 VIEW COMPARISON:  None. FINDINGS: No acute fracture identified. Degenerative changes of the interphalangeal joints. Degenerative changes of the midfoot. Enthesopathic changes at the plantar fascia insertion. No radiopaque foreign body or focal soft tissue swelling IMPRESSION: No acute bony abnormality. Enthesopathic changes at the calcaneal insertion of the plantar fascia. Electronically Signed   By: Gilmer MorJaime  Wagner D.O.   On: 05/08/2017 11:32     Medical Consultants:    None.  Anti-Infectives:   None  Subjective:    Jamie Pinchichard C Sanda no new complaints.  Objective:    Vitals:   05/08/17 1157 05/08/17 1818 05/08/17 2100 05/09/17 0646  BP: 111/80 131/90 124/78 (!) 138/97  Pulse:  79 86 78  Resp:  16 19 18   Temp: 97.9 F (36.6 C) 98.1 F (36.7 C) 98.4 F (36.9 C) 99 F (37.2 C)  TempSrc: Oral Oral Oral Oral  SpO2:  97% 97%   Weight:      Height:        Intake/Output Summary (Last 24 hours) at 05/09/2017 1125 Last data filed at 05/09/2017 0600 Gross per 24 hour  Intake 480 ml  Output 1020 ml  Net -540 ml   Filed Weights   05/07/17 2047  Weight: 92.9 kg (204 lb 12.9 oz)    Exam: General  exam: In no acute distress. Respiratory system: Good air movement and clear to auscultation. Cardiovascular system: S1 & S2 heard, RRR. No JVD, murmurs, rubs, gallops or clicks.  Gastrointestinal system: Abdomen is nondistended, soft and nontender.  Central nervous system: Alert and oriented. No focal neurological deficits. Extremities: No pedal edema. Skin: No rashes, lesions or ulcers Psychiatry: Judgement and insight appear normal. Mood & affect appropriate.    Data Reviewed:    Labs: Basic Metabolic Panel: Recent Labs  Lab 05/06/17 1931 05/07/17 1044  05/07/17 1233 05/08/17 0203 05/09/17 0544  NA 137 137  --  134* 136  K 3.4* 3.2*  --  2.9* 3.4*  CL 100* 98*  --  97* 102  CO2 25 14*  --  25 24  GLUCOSE 101* 191*  --  131* 106*  BUN 7 7  --  5* 5*  CREATININE 0.70 1.05  --  0.79 0.72  CALCIUM 9.0 8.3*  --  8.2* 8.3*  MG  --   --  1.3*  --  1.7  PHOS  --   --   --   --  2.3*   GFR Estimated Creatinine Clearance: 118.8 mL/min (by C-G formula based on SCr of 0.72 mg/dL). Liver Function Tests: Recent Labs  Lab 05/07/17 1044 05/08/17 0203 05/09/17 0544  AST 41 38  --   ALT 29 25  --   ALKPHOS 46 42  --   BILITOT 0.8 0.7  --   PROT 7.6 7.7  --   ALBUMIN 4.1 3.9 3.6   No results for input(s): LIPASE, AMYLASE in the last 168 hours. No results for input(s): AMMONIA in the last 168 hours. Coagulation profile No results for input(s): INR, PROTIME in the last 168 hours.  CBC: Recent Labs  Lab 05/06/17 1931 05/07/17 1044 05/08/17 0203  WBC 3.5* 5.3 6.3  NEUTROABS  --  3.5  --   HGB 12.3* 12.1* 12.9*  HCT 36.0* 36.4* 38.7*  MCV 96.8 100.0 98.2  PLT 142* 126* 143*   Cardiac Enzymes: No results for input(s): CKTOTAL, CKMB, CKMBINDEX, TROPONINI in the last 168 hours. BNP (last 3 results) No results for input(s): PROBNP in the last 8760 hours. CBG: Recent Labs  Lab 05/08/17 0810 05/08/17 1155 05/08/17 1702 05/08/17 2126 05/09/17 0653  GLUCAP 125* 150* 108* 107* 100*   D-Dimer: Recent Labs    05/07/17 0326  DDIMER 0.73*   Hgb A1c: Recent Labs    05/07/17 1044  HGBA1C 6.0*   Lipid Profile: No results for input(s): CHOL, HDL, LDLCALC, TRIG, CHOLHDL, LDLDIRECT in the last 72 hours. Thyroid function studies: Recent Labs    05/07/17 1300  TSH 1.181   Anemia work up: No results for input(s): VITAMINB12, FOLATE, FERRITIN, TIBC, IRON, RETICCTPCT in the last 72 hours. Sepsis Labs: Recent Labs  Lab 05/06/17 1931 05/07/17 1044 05/08/17 0203  WBC 3.5* 5.3 6.3   Microbiology Recent Results (from the past  240 hour(s))  MRSA PCR Screening     Status: None   Collection Time: 05/08/17  1:00 AM  Result Value Ref Range Status   MRSA by PCR NEGATIVE NEGATIVE Final    Comment:        The GeneXpert MRSA Assay (FDA approved for NASAL specimens only), is one component of a comprehensive MRSA colonization surveillance program. It is not intended to diagnose MRSA infection nor to guide or monitor treatment for MRSA infections.      Medications:   . amLODipine  5 mg  Oral Daily  . aspirin  81 mg Oral Daily  . atorvastatin  20 mg Oral q1800  . carvedilol  25 mg Oral BID WC  . enoxaparin (LOVENOX) injection  40 mg Subcutaneous Q24H  . folic acid  1 mg Oral Daily  . Influenza vac split quadrivalent PF  0.5 mL Intramuscular Tomorrow-1000  . insulin aspart  0-5 Units Subcutaneous QHS  . insulin aspart  0-9 Units Subcutaneous TID WC  . irbesartan  300 mg Oral Daily  . isosorbide mononitrate  30 mg Oral Daily  . levETIRAcetam  500 mg Oral BID  . LORazepam  0-4 mg Intravenous Q12H   Or  . LORazepam  0-4 mg Oral Q12H  . magnesium oxide  800 mg Oral BID  . multivitamin with minerals  1 tablet Oral Daily  . pneumococcal 23 valent vaccine  0.5 mL Intramuscular Tomorrow-1000  . potassium chloride  40 mEq Oral TID  . thiamine  100 mg Oral Daily   Or  . thiamine  100 mg Intravenous Daily   Continuous Infusions:    LOS: 2 days   Marinda ElkAbraham Feliz Ortiz  [facility] Pager [phone]  *Please refer to [email or web address], password [email or web address] to get updated schedule on who will round on this patient, as hospitalists switch teams weekly. If 7PM-7AM, please contact night-coverage at [email or web address], password [email or web address] for any overnight needs.  05/09/2017, 11:25 AM

## 2017-05-09 NOTE — Plan of Care (Signed)
  Progressing Pain Managment: General experience of comfort will improve 05/09/2017 0231 - Progressing by Agnes LawrenceLackey, Jhase Creppel, RN Safety: Ability to remain free from injury will improve 05/09/2017 0231 - Progressing by Agnes LawrenceLackey, Relena Ivancic, RN 05/09/2017 0231 - Progressing by Agnes LawrenceLackey, Percy Winterrowd, RN

## 2017-05-09 NOTE — Progress Notes (Signed)
Spoke to patient's Religious representative regard visiting with patient and AD request.  It is their process to handle all such documents due to certain Blood and other medical restrictions. After having conversation with patient minister will have nurse follow up with Chaplain if services are needed further .  Jamie Stewart, Jamie Stewart, Jamie Stewart, [facility], Pager [phone]

## 2017-05-10 DIAGNOSIS — I10 Essential (primary) hypertension: Secondary | ICD-10-CM

## 2017-05-10 DIAGNOSIS — F10239 Alcohol dependence with withdrawal, unspecified: Secondary | ICD-10-CM

## 2017-05-10 LAB — BASIC METABOLIC PANEL
Anion gap: 9 (ref 5–15)
BUN: 7 mg/dL (ref 6–20)
CHLORIDE: 104 mmol/L (ref 101–111)
CO2: 23 mmol/L (ref 22–32)
Calcium: 9.1 mg/dL (ref 8.9–10.3)
Creatinine, Ser: 0.72 mg/dL (ref 0.61–1.24)
GFR calc Af Amer: >60 mL/min (ref >60)
GFR calc non Af Amer: >60 mL/min (ref >60)
Glucose, Bld: 103 mg/dL — ABNORMAL HIGH (ref 65–99)
POTASSIUM: 4.5 mmol/L (ref 3.5–5.1)
SODIUM: 136 mmol/L (ref 135–145)

## 2017-05-10 LAB — GLUCOSE, CAPILLARY
GLUCOSE-CAPILLARY: 90 mg/dL (ref 65–99)
Glucose-Capillary: 81 mg/dL (ref 65–99)

## 2017-05-10 MED ORDER — LEVETIRACETAM 500 MG PO TABS
500.0000 mg | ORAL_TABLET | Freq: Two times a day (BID) | ORAL | 2 refills | Status: DC
Start: 2017-05-10 — End: 2017-06-05

## 2017-05-10 NOTE — Discharge Summary (Signed)
Physician Discharge Summary  Jamie PinchRichard C Mcnairy WUJ:[id number] DOB: [birth year] DOA: 05/07/2017  PCP: Corwin LevinsJohn, James W, MD  Admit date: 05/07/2017 Discharge date: 05/10/2017  Admitted From: home Disposition:  Home  Recommendations for Outpatient Follow-up:  1. Follow up with neurology in 1-2 weeks 2. Please obtain BMP/CBC in one week   Home Health:no Equipment/Devices:none  Discharge Condition:stable CODE STATUS:full Diet recommendation: Heart Healthy   Brief/Interim Summary:  56 y.o. male past medical history of essential hypertension, hyperlipidemia bipolar disorder, alcohol abuse who came into the emergency room for 4-5 episodes of tonic-clonic seizure episodes  Discharge Diagnoses:  Principal Problem:   Seizure (HCC) Active Problems:   Essential hypertension   Acute encephalopathy   ETOH abuse   Hypokalemia   Thrombocytopenia (HCC)   Hyperglycemia  Acute encephalopathy likely due to Seizure [facility](HCC): In the ED received several doses of Ativan and seizure activity ceased. EEG was performed that showed no seizure activity. Spoke with the neurologist will start him on  Keppra 500mg  a day, monitor for an additional 24 hours can be discharged in the morning. Please remember to explained to the patient that he cannot drive or operate heavy machinery for the next 6 months. Neurology is getting him a follow-up appointment.  Alcohol abuse: No signs of withdrawl He has had no further seizures while in-house.  Thrombocytopenia: Likely due to alcohol abuse.  Chest pain: With a negative stress test in 2015.  Hypomagnesemia/hypokalemia: Probably due to hypokalemia replete orally and recheck  Body mass index is 30.24 kg/m. Outpatient counseling regarding weight loss once stabilized  Essential hypertension Continue current home regimen.  Blood pressure seems to be controlled.    Discharge Instructions   Allergies as of 05/10/2017   No Known Allergies     Medication  List    TAKE these medications   albuterol 108 (90 Base) MCG/ACT inhaler Commonly known as:  VENTOLIN HFA Inhale 2 puffs into the lungs every 6 (six) hours as needed for wheezing or shortness of breath.   amLODipine 5 MG tablet Commonly known as:  NORVASC Take 1 tablet (5 mg total) by mouth daily.   aspirin 81 MG chewable tablet Chew 81 mg by mouth daily.   carvedilol 25 MG tablet Commonly known as:  COREG Take 1 tablet (25 mg total) by mouth 2 (two) times daily with a meal.   escitalopram 10 MG tablet Commonly known as:  LEXAPRO TAKE 1 TABLET (10 MG TOTAL) BY MOUTH DAILY.   irbesartan 300 MG tablet Commonly known as:  AVAPRO Take 1 tablet (300 mg total) by mouth daily.   isosorbide mononitrate 30 MG 24 hr tablet Commonly known as:  IMDUR Take 1 tablet (30 mg total) by mouth daily.   lansoprazole 15 MG capsule Commonly known as:  PREVACID Take 1 capsule (15 mg total) by mouth daily at 12 noon.   levETIRAcetam 500 MG tablet Commonly known as:  KEPPRA Take 1 tablet (500 mg total) by mouth 2 (two) times daily.   omega-3 acid ethyl esters 1 g capsule Commonly known as:  LOVAZA Take 1 g by mouth daily.   simvastatin 20 MG tablet Commonly known as:  ZOCOR TAKE 1 TABLET (20 MG TOTAL) BY MOUTH DAILY AT 6 PM.   SUMAtriptan 100 MG tablet Commonly known as:  IMITREX Take 1 tablet (100 mg total) by mouth every 2 (two) hours as needed for migraine or headache. May repeat in 2 hours if headache persists or recurs.   vitamin B-12 1000 MCG  tablet Commonly known as:  CYANOCOBALAMIN Take 1,000 mcg by mouth daily.      Follow-up Information    [facility] Follow up in 2 week(s).   Contact information: [address]     [address] [place] [address] [phone]       [facility] .   Contact information: [address] [place] [address] [phone]       [facility] .    Contact information: [address] [place] [address] [phone]         No Known Allergies  Consultations:  None   Procedures/Studies: Dg Chest 2 View  Result Date: 05/06/2017 CLINICAL DATA:  Worsening left shoulder and chest pain. EXAM: CHEST  2 VIEW COMPARISON:  01/22/2017 FINDINGS: Cardiomediastinal silhouette is normal. Mediastinal contours appear intact. Tortuosity of the aorta. There is no evidence of focal airspace consolidation, pleural effusion or pneumothorax. Osseous structures are without acute abnormality. Soft tissues are grossly normal. IMPRESSION: Tortuosity of the aorta. Otherwise no radiographic evidence of acute cardiopulmonary disease. Electronically Signed   By: Ted Mcalpine M.D.   On: 05/06/2017 20:05   Ct Head Wo Contrast  Result Date: 05/07/2017 CLINICAL DATA:  Seizure, nontraumatic.  Ethanol withdrawal. EXAM: CT HEAD WITHOUT CONTRAST TECHNIQUE: Contiguous axial images were obtained from the base of the skull through the vertex without intravenous contrast. COMPARISON:  09/24/2013 FINDINGS: Brain: No evidence of acute infarction, hemorrhage, hydrocephalus, extra-axial collection or mass lesion/mass effect. Moderate area of encephalomalacia in the inferior right frontal lobe that is stable from 2015. This could be post ischemic or posttraumatic in this location. Mild white matter disease was also seen on prior, likely microvascular ischemic given patient's medical history. Age advanced cerebral volume loss. Vascular: Mild arterial calcification.  No hyperdense vessel. Skull: No acute or aggressive finding. Sinuses/Orbits: Polypoid densities in the bilateral nasal cavity IMPRESSION: 1. No acute finding. 2. Chronic moderate right inferior frontal encephalomalacia that involves cortex. 3. Mild chronic white matter disease. 4. Chronic bilateral nasal cavity polyps. Electronically Signed   By: Marnee Spring M.D.   On: 05/07/2017 11:20   Ct  Angio Chest Pe W And/or Wo Contrast  Result Date: 05/07/2017 CLINICAL DATA:  Chest pain that radiates into the shoulder blades since yesterday. Pulmonary embolism suspected. EXAM: CT ANGIOGRAPHY CHEST WITH CONTRAST TECHNIQUE: Multidetector CT imaging of the chest was performed using the standard protocol during bolus administration of intravenous contrast. Multiplanar CT image reconstructions and MIPs were obtained to evaluate the vascular anatomy. CONTRAST:  ISOVUE-370 IOPAMIDOL (ISOVUE-370) INJECTION 76% COMPARISON:  12/07/2013 FINDINGS: Cardiovascular: Satisfactory opacification of the pulmonary arteries to the segmental level. No evidence of pulmonary embolism. No cardiomegaly. No pericardial effusion. Tortuous aorta with ductus bump. Wall thickening of the descending aorta is stable from prior and attributed atherosclerosis. Mediastinum/Nodes: Negative for adenopathy or mass. Lungs/Pleura: Lungs are clear. No pleural effusion or pneumothorax. Upper Abdomen: No explanation for pain. Musculoskeletal: No acute or aggressive finding Review of the MIP images confirms the above findings. IMPRESSION: Negative for pulmonary embolism or other acute finding. Electronically Signed   By: Marnee Spring M.D.   On: 05/07/2017 06:45   Dg Foot 2 Views Left  Result Date: 05/08/2017 CLINICAL DATA:  56 year old male with a history of foot pain EXAM: LEFT FOOT - 2 VIEW COMPARISON:  None. FINDINGS: No acute fracture identified. Degenerative changes of the interphalangeal joints. Degenerative changes of the midfoot. Enthesopathic  changes at the plantar fascia insertion. No radiopaque foreign body or focal soft tissue swelling IMPRESSION: No acute bony abnormality. Enthesopathic changes at the calcaneal insertion of the plantar fascia. Electronically Signed   By: Gilmer Mor D.O.   On: 05/08/2017 11:32     Subjective: No complains  Discharge Exam: Vitals:   05/10/17 0543 05/10/17 0952  BP: (!) 146/100 (!)  134/99  Pulse: 76 73  Resp: 17   Temp: 98.4 F (36.9 C)   SpO2: 99%    Vitals:   05/09/17 1300 05/09/17 2121 05/10/17 0543 05/10/17 0952  BP: (!) 127/93 130/90 (!) 146/100 (!) 134/99  Pulse: 74 72 76 73  Resp: 18 17 17    Temp: 99.2 F (37.3 C) 98.2 F (36.8 C) 98.4 F (36.9 C)   TempSrc: Oral Oral Oral   SpO2: 98% 99% 99%   Weight:      Height:        General: Pt is alert, awake, not in acute distress Cardiovascular: RRR, S1/S2 +, no rubs, no gallops Respiratory: CTA bilaterally, no wheezing, no rhonchi Abdominal: Soft, NT, ND, bowel sounds + Extremities: no edema, no cyanosis    The results of significant diagnostics from this hospitalization (including imaging, microbiology, ancillary and laboratory) are listed below for reference.     Microbiology: Recent Results (from the past 240 hour(s))  MRSA PCR Screening     Status: None   Collection Time: 05/08/17  1:00 AM  Result Value Ref Range Status   MRSA by PCR NEGATIVE NEGATIVE Final    Comment:        The GeneXpert MRSA Assay (FDA approved for NASAL specimens only), is one component of a comprehensive MRSA colonization surveillance program. It is not intended to diagnose MRSA infection nor to guide or monitor treatment for MRSA infections.      Labs: BNP (last 3 results) No results for input(s): BNP in the last 8760 hours. Basic Metabolic Panel: Recent Labs  Lab 05/06/17 1931 05/07/17 1044 05/07/17 1233 05/08/17 0203 05/09/17 0544 05/10/17 0503  NA 137 137  --  134* 136 136  K 3.4* 3.2*  --  2.9* 3.4* 4.5  CL 100* 98*  --  97* 102 104  CO2 25 14*  --  25 24 23   GLUCOSE 101* 191*  --  131* 106* 103*  BUN 7 7  --  5* 5* 7  CREATININE 0.70 1.05  --  0.79 0.72 0.72  CALCIUM 9.0 8.3*  --  8.2* 8.3* 9.1  MG  --   --  1.3*  --  1.7  --   PHOS  --   --   --   --  2.3*  --    Liver Function Tests: Recent Labs  Lab 05/07/17 1044 05/08/17 0203 05/09/17 0544  AST 41 38  --   ALT 29 25  --    ALKPHOS 46 42  --   BILITOT 0.8 0.7  --   PROT 7.6 7.7  --   ALBUMIN 4.1 3.9 3.6   No results for input(s): LIPASE, AMYLASE in the last 168 hours. No results for input(s): AMMONIA in the last 168 hours. CBC: Recent Labs  Lab 05/06/17 1931 05/07/17 1044 05/08/17 0203  WBC 3.5* 5.3 6.3  NEUTROABS  --  3.5  --   HGB 12.3* 12.1* 12.9*  HCT 36.0* 36.4* 38.7*  MCV 96.8 100.0 98.2  PLT 142* 126* 143*   Cardiac Enzymes: No results for input(s): CKTOTAL, CKMB,  CKMBINDEX, TROPONINI in the last 168 hours. BNP: Invalid input(s): POCBNP CBG: Recent Labs  Lab 05/09/17 1342 05/09/17 1746 05/09/17 2206 05/10/17 0619 05/10/17 1208  GLUCAP 111* 161* 97 90 81   D-Dimer No results for input(s): DDIMER in the last 72 hours. Hgb A1c No results for input(s): HGBA1C in the last 72 hours. Lipid Profile No results for input(s): CHOL, HDL, LDLCALC, TRIG, CHOLHDL, LDLDIRECT in the last 72 hours. Thyroid function studies Recent Labs    05/07/17 1300  TSH 1.181   Anemia work up No results for input(s): VITAMINB12, FOLATE, FERRITIN, TIBC, IRON, RETICCTPCT in the last 72 hours. Urinalysis    Component Value Date/Time   COLORURINE STRAW (A) 05/07/2017 1205   APPEARANCEUR CLEAR 05/07/2017 1205   LABSPEC 1.016 05/07/2017 1205   PHURINE 5.0 05/07/2017 1205   GLUCOSEU 150 (A) 05/07/2017 1205   GLUCOSEU NEGATIVE 10/15/2014 1612   HGBUR SMALL (A) 05/07/2017 1205   BILIRUBINUR NEGATIVE 05/07/2017 1205   KETONESUR 5 (A) 05/07/2017 1205   PROTEINUR NEGATIVE 05/07/2017 1205   UROBILINOGEN 0.2 10/15/2014 1612   NITRITE NEGATIVE 05/07/2017 1205   LEUKOCYTESUR NEGATIVE 05/07/2017 1205   Sepsis Labs Invalid input(s): PROCALCITONIN,  WBC,  LACTICIDVEN Microbiology Recent Results (from the past 240 hour(s))  MRSA PCR Screening     Status: None   Collection Time: 05/08/17  1:00 AM  Result Value Ref Range Status   MRSA by PCR NEGATIVE NEGATIVE Final    Comment:        The GeneXpert MRSA  Assay (FDA approved for NASAL specimens only), is one component of a comprehensive MRSA colonization surveillance program. It is not intended to diagnose MRSA infection nor to guide or monitor treatment for MRSA infections.      Time coordinating discharge: Over 30 minutes  SIGNED:   Marinda ElkAbraham Feliz Ortiz, MD  [facility] 05/10/2017, 12:14 PM Pager   If 7PM-7AM, please contact night-coverage [email or web address] Password [email or web address]

## 2017-05-10 NOTE — Progress Notes (Signed)
Jamie Stewart to be D/C'd Home per MD order.  Discussed prescriptions and follow up appointments with the patient. Prescriptions given to patient, medication list explained in detail. Pt verbalized understanding.  Allergies as of 05/10/2017   No Known Allergies     Medication List    TAKE these medications   albuterol 108 (90 Base) MCG/ACT inhaler Commonly known as:  VENTOLIN HFA Inhale 2 puffs into the lungs every 6 (six) hours as needed for wheezing or shortness of breath.   amLODipine 5 MG tablet Commonly known as:  NORVASC Take 1 tablet (5 mg total) by mouth daily.   aspirin 81 MG chewable tablet Chew 81 mg by mouth daily.   carvedilol 25 MG tablet Commonly known as:  COREG Take 1 tablet (25 mg total) by mouth 2 (two) times daily with a meal.   escitalopram 10 MG tablet Commonly known as:  LEXAPRO TAKE 1 TABLET (10 MG TOTAL) BY MOUTH DAILY.   irbesartan 300 MG tablet Commonly known as:  AVAPRO Take 1 tablet (300 mg total) by mouth daily.   isosorbide mononitrate 30 MG 24 hr tablet Commonly known as:  IMDUR Take 1 tablet (30 mg total) by mouth daily.   lansoprazole 15 MG capsule Commonly known as:  PREVACID Take 1 capsule (15 mg total) by mouth daily at 12 noon.   levETIRAcetam 500 MG tablet Commonly known as:  KEPPRA Take 1 tablet (500 mg total) by mouth 2 (two) times daily.   omega-3 acid ethyl esters 1 g capsule Commonly known as:  LOVAZA Take 1 g by mouth daily.   simvastatin 20 MG tablet Commonly known as:  ZOCOR TAKE 1 TABLET (20 MG TOTAL) BY MOUTH DAILY AT 6 PM.   SUMAtriptan 100 MG tablet Commonly known as:  IMITREX Take 1 tablet (100 mg total) by mouth every 2 (two) hours as needed for migraine or headache. May repeat in 2 hours if headache persists or recurs.   vitamin B-12 1000 MCG tablet Commonly known as:  CYANOCOBALAMIN Take 1,000 mcg by mouth daily.       Vitals:   05/10/17 0952 05/10/17 1300  BP: (!) 134/99 139/89  Pulse: 73 69   Resp:  18  Temp:  97.9 F (36.6 C)  SpO2:  98%    Skin clean, dry and intact without evidence of skin break down, no evidence of skin tears noted. IV catheter discontinued intact. Site without signs and symptoms of complications. Dressing and pressure applied. Pt denies pain at this time. No complaints noted.  An After Visit Summary was printed and given to the patient. Patient escorted via WC, and D/C home via private auto.  Nelma RothmanNatalie Nia Nathaniel, RN [facility]  Phone [phone]

## 2017-05-21 ENCOUNTER — Other Ambulatory Visit: Payer: Self-pay | Admitting: Internal Medicine

## 2017-06-05 ENCOUNTER — Encounter: Payer: Self-pay | Admitting: Internal Medicine

## 2017-06-05 ENCOUNTER — Other Ambulatory Visit (INDEPENDENT_AMBULATORY_CARE_PROVIDER_SITE_OTHER): Payer: Self-pay

## 2017-06-05 ENCOUNTER — Ambulatory Visit (INDEPENDENT_AMBULATORY_CARE_PROVIDER_SITE_OTHER): Payer: Self-pay | Admitting: Internal Medicine

## 2017-06-05 VITALS — BP 136/96 | HR 87 | Temp 98.8°F | Ht 69.0 in | Wt 214.0 lb

## 2017-06-05 DIAGNOSIS — R972 Elevated prostate specific antigen [PSA]: Secondary | ICD-10-CM

## 2017-06-05 DIAGNOSIS — E785 Hyperlipidemia, unspecified: Secondary | ICD-10-CM

## 2017-06-05 DIAGNOSIS — R569 Unspecified convulsions: Secondary | ICD-10-CM

## 2017-06-05 DIAGNOSIS — R7302 Impaired glucose tolerance (oral): Secondary | ICD-10-CM

## 2017-06-05 DIAGNOSIS — I10 Essential (primary) hypertension: Secondary | ICD-10-CM

## 2017-06-05 DIAGNOSIS — Z0000 Encounter for general adult medical examination without abnormal findings: Secondary | ICD-10-CM

## 2017-06-05 LAB — CBC WITH DIFFERENTIAL/PLATELET
BASOS ABS: 0 10*3/uL (ref 0.0–0.1)
Basophils Relative: 1.3 % (ref 0.0–3.0)
EOS ABS: 0.1 10*3/uL (ref 0.0–0.7)
Eosinophils Relative: 2.8 % (ref 0.0–5.0)
HCT: 37.3 % — ABNORMAL LOW (ref 39.0–52.0)
Hemoglobin: 12.5 g/dL — ABNORMAL LOW (ref 13.0–17.0)
LYMPHS ABS: 1.7 10*3/uL (ref 0.7–4.0)
Lymphocytes Relative: 45.1 % (ref 12.0–46.0)
MCHC: 33.6 g/dL (ref 30.0–36.0)
MCV: 100.9 fl — ABNORMAL HIGH (ref 78.0–100.0)
Monocytes Absolute: 0.3 10*3/uL (ref 0.1–1.0)
Monocytes Relative: 7.8 % (ref 3.0–12.0)
NEUTROS PCT: 43 % (ref 43.0–77.0)
Neutro Abs: 1.6 10*3/uL (ref 1.4–7.7)
PLATELETS: 224 10*3/uL (ref 150.0–400.0)
RBC: 3.7 Mil/uL — ABNORMAL LOW (ref 4.22–5.81)
RDW: 14.3 % (ref 11.5–15.5)
WBC: 3.7 10*3/uL — ABNORMAL LOW (ref 4.0–10.5)

## 2017-06-05 LAB — URINALYSIS, ROUTINE W REFLEX MICROSCOPIC
Bilirubin Urine: NEGATIVE
Hgb urine dipstick: NEGATIVE
LEUKOCYTES UA: NEGATIVE
NITRITE: NEGATIVE
RBC / HPF: NONE SEEN (ref 0–?)
SPECIFIC GRAVITY, URINE: 1.02 (ref 1.000–1.030)
Total Protein, Urine: NEGATIVE
URINE GLUCOSE: NEGATIVE
UROBILINOGEN UA: 1 (ref 0.0–1.0)
pH: 7 (ref 5.0–8.0)

## 2017-06-05 LAB — HEPATIC FUNCTION PANEL
ALK PHOS: 47 U/L (ref 39–117)
ALT: 24 U/L (ref 0–53)
AST: 28 U/L (ref 0–37)
Albumin: 4.2 g/dL (ref 3.5–5.2)
Bilirubin, Direct: 0.1 mg/dL (ref 0.0–0.3)
TOTAL PROTEIN: 7.2 g/dL (ref 6.0–8.3)
Total Bilirubin: 0.3 mg/dL (ref 0.2–1.2)

## 2017-06-05 LAB — TSH: TSH: 2.05 u[IU]/mL (ref 0.35–4.50)

## 2017-06-05 LAB — LIPID PANEL
CHOLESTEROL: 168 mg/dL (ref 0–200)
HDL: 61.6 mg/dL (ref >39.00)
LDL CALC: 78 mg/dL (ref 0–99)
NonHDL: 106.29
TRIGLYCERIDES: 140 mg/dL (ref 0.0–149.0)
Total CHOL/HDL Ratio: 3
VLDL: 28 mg/dL (ref 0.0–40.0)

## 2017-06-05 LAB — BASIC METABOLIC PANEL
BUN: 10 mg/dL (ref 6–23)
CHLORIDE: 102 meq/L (ref 96–112)
CO2: 28 mEq/L (ref 19–32)
Calcium: 8.6 mg/dL (ref 8.4–10.5)
Creatinine, Ser: 0.84 mg/dL (ref 0.40–1.50)
GFR: 122.1 mL/min (ref >60.00)
Glucose, Bld: 130 mg/dL — ABNORMAL HIGH (ref 70–99)
POTASSIUM: 3.8 meq/L (ref 3.5–5.1)
SODIUM: 142 meq/L (ref 135–145)

## 2017-06-05 LAB — PSA: PSA: 2.23 ng/mL (ref 0.10–4.00)

## 2017-06-05 MED ORDER — ALBUTEROL SULFATE HFA 108 (90 BASE) MCG/ACT IN AERS: 2.0000 | INHALATION_SPRAY | Freq: Four times a day (QID) | RESPIRATORY_TRACT | 11 refills | Status: DC | PRN

## 2017-06-05 MED ORDER — ISOSORBIDE MONONITRATE ER 30 MG PO TB24: 30.0000 mg | ORAL_TABLET | Freq: Every day | ORAL | 11 refills | Status: DC

## 2017-06-05 MED ORDER — CARVEDILOL 25 MG PO TABS: 25.0000 mg | ORAL_TABLET | Freq: Two times a day (BID) | ORAL | 11 refills | Status: DC

## 2017-06-05 MED ORDER — IRBESARTAN 300 MG PO TABS: 300.0000 mg | ORAL_TABLET | Freq: Every day | ORAL | 11 refills | Status: AC

## 2017-06-05 MED ORDER — AMLODIPINE BESYLATE 5 MG PO TABS: 5.0000 mg | ORAL_TABLET | Freq: Every day | ORAL | 11 refills | Status: DC

## 2017-06-05 MED ORDER — SIMVASTATIN 20 MG PO TABS: ORAL_TABLET | ORAL | 3 refills | Status: DC

## 2017-06-05 MED ORDER — ESCITALOPRAM OXALATE 10 MG PO TABS: ORAL_TABLET | ORAL | 3 refills | Status: DC

## 2017-06-05 MED ORDER — LEVETIRACETAM 500 MG PO TABS: 500.0000 mg | ORAL_TABLET | Freq: Two times a day (BID) | ORAL | 11 refills | Status: DC

## 2017-06-05 MED ORDER — SUMATRIPTAN SUCCINATE 100 MG PO TABS: 100.0000 mg | ORAL_TABLET | ORAL | 5 refills | Status: DC | PRN

## 2017-06-05 NOTE — Assessment & Plan Note (Signed)
With known BPH and renal stone hx as well - refer urology

## 2017-06-05 NOTE — Assessment & Plan Note (Signed)
stable overall by history and exam, recent data reviewed with pt, and pt to continue medical treatment as before,  to f/u any worsening symptoms or concerns  

## 2017-06-05 NOTE — Assessment & Plan Note (Signed)
Lab Results  Component Value Date   LDLCALC 78 06/05/2017  stable overall by history and exam, recent data reviewed with pt, and pt to continue medical treatment as before,  to f/u any worsening symptoms or concerns

## 2017-06-05 NOTE — Assessment & Plan Note (Signed)
Stable, cont same tx, pt for some reason does have neuro f/u appt; will refer to neurology
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
## 2017-06-05 NOTE — Assessment & Plan Note (Signed)
BP Readings from Last 3 Encounters:  06/05/17 (!) 136/96  05/10/17 (!) 140/100  05/07/17 (!) 150/97  mild to mod persistent elevated, to restart oral meds, cont to monitor BP at home and next visit, goal < 140/90

## 2017-06-05 NOTE — Patient Instructions (Addendum)
Please remember to contact the [facility] office to ask about the [facility].    You will be contacted regarding the referral for: neurology, as well as Urology  Please continue all other medications as before, and refills have been done if requested.  Please have the pharmacy call with any other refills you may need.  Please continue your efforts at being more active, low cholesterol diet, and weight control.  Please keep your appointments with your specialists as you may have planned  Please go to the LAB in the Basement (turn left off the elevator) for the tests to be done today  You will be contacted by phone if any changes need to be made immediately.  Otherwise, you will receive a letter about your results with an explanation, but please check with MyChart first.  Please remember to sign up for MyChart if you have not done so, as this will be important to you in the future with finding out test results, communicating by private email, and scheduling acute appointments online when needed.  Please return in 6 months, or sooner if needed, with Lab testing done 3-5 days before

## 2017-06-06 ENCOUNTER — Other Ambulatory Visit (INDEPENDENT_AMBULATORY_CARE_PROVIDER_SITE_OTHER): Payer: Self-pay

## 2017-06-06 DIAGNOSIS — R739 Hyperglycemia, unspecified: Secondary | ICD-10-CM

## 2017-06-06 LAB — HEMOGLOBIN A1C: Hgb A1c MFr Bld: 6.1 % (ref 4.6–6.5)

## 2017-06-12 ENCOUNTER — Encounter: Payer: Self-pay | Admitting: Neurology

## 2017-06-28 ENCOUNTER — Ambulatory Visit: Payer: Self-pay | Admitting: Neurology

## 2017-07-22 ENCOUNTER — Encounter: Payer: Self-pay | Admitting: Internal Medicine

## 2017-08-30 ENCOUNTER — Ambulatory Visit (INDEPENDENT_AMBULATORY_CARE_PROVIDER_SITE_OTHER): Payer: Self-pay | Admitting: Internal Medicine

## 2017-08-30 ENCOUNTER — Encounter: Payer: Self-pay | Admitting: Internal Medicine

## 2017-08-30 ENCOUNTER — Ambulatory Visit (INDEPENDENT_AMBULATORY_CARE_PROVIDER_SITE_OTHER)
Admission: RE | Admit: 2017-08-30 | Discharge: 2017-08-30 | Disposition: A | Discharge status: Home / Self Care | Payer: Self-pay | Source: Ambulatory Visit | Attending: Internal Medicine | Admitting: Internal Medicine

## 2017-08-30 VITALS — BP 124/84 | HR 74 | Temp 98.4°F | Ht 69.0 in | Wt 206.0 lb

## 2017-08-30 DIAGNOSIS — R634 Abnormal weight loss: Secondary | ICD-10-CM

## 2017-08-30 DIAGNOSIS — R5383 Other fatigue: Secondary | ICD-10-CM | POA: Insufficient documentation

## 2017-08-30 DIAGNOSIS — G4700 Insomnia, unspecified: Secondary | ICD-10-CM

## 2017-08-30 DIAGNOSIS — F32A Depression, unspecified: Secondary | ICD-10-CM

## 2017-08-30 DIAGNOSIS — F329 Major depressive disorder, single episode, unspecified: Secondary | ICD-10-CM

## 2017-08-30 DIAGNOSIS — R112 Nausea with vomiting, unspecified: Secondary | ICD-10-CM

## 2017-08-30 MED ORDER — ESCITALOPRAM OXALATE 20 MG PO TABS: 20.0000 mg | ORAL_TABLET | Freq: Every day | ORAL | 3 refills | Status: DC

## 2017-08-30 MED ORDER — PANTOPRAZOLE SODIUM 40 MG PO TBEC: 40.0000 mg | DELAYED_RELEASE_TABLET | Freq: Every day | ORAL | 3 refills | Status: DC

## 2017-08-30 MED ORDER — ONDANSETRON HCL 4 MG PO TABS: 4.0000 mg | ORAL_TABLET | Freq: Three times a day (TID) | ORAL | 0 refills | Status: DC | PRN

## 2017-08-30 MED ORDER — TRAZODONE HCL 50 MG PO TABS: 25.0000 mg | ORAL_TABLET | Freq: Every evening | ORAL | 1 refills | Status: DC | PRN

## 2017-08-30 NOTE — Assessment & Plan Note (Addendum)
I suspect worsening depression, for increased lexapro to 20 qd, watch for sluggishness, consider change to prozac  Note:  Total time for pt hx, exam, review of record with pt in the room, determination of diagnoses and plan for further eval and tx is > 40 min, with over 50% spent in coordination and counseling of patient including the differential dx, tx, further evaluation and other management of fatigue, wt loss, depression, n/v and insomnia

## 2017-08-30 NOTE — Assessment & Plan Note (Signed)
For zofran, PPI and refer GI per pt request

## 2017-08-30 NOTE — Assessment & Plan Note (Signed)
Ok for trazodone qhs prn,  to f/u any worsening symptoms or concerns  
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
## 2017-08-30 NOTE — Assessment & Plan Note (Signed)
For increased lexapro as above, consider prozac if not effective

## 2017-08-30 NOTE — Assessment & Plan Note (Signed)
Also for cxr,  to f/u any worsening symptoms or concerns 

## 2017-08-30 NOTE — Patient Instructions (Addendum)
Please take all new medication as prescribed - the increased lexapro to 20 mg per dayt, trazodone at bedtime for sleep as needed, zofran as needed for nausea, and protonix for antacid  Please continue all other medications as before, and refills have been done if requested.  Please have the pharmacy call with any other refills you may need.  Please keep your appointments with your specialists as you may have planned  You will be contacted regarding the referral for: Gastroenterology  Please go to the XRAY Department in the Basement (go straight as you get off the elevator) for the x-ray testing  You will be contacted by phone if any changes need to be made immediately.  Otherwise, you will receive a letter about your results with an explanation, but please check with MyChart first.  Please remember to sign up for MyChart if you have not done so, as this will be important to you in the future with finding out test results, communicating by private email, and scheduling acute appointments online when needed.

## 2017-09-02 ENCOUNTER — Encounter: Payer: Self-pay | Admitting: Gastroenterology

## 2017-10-14 ENCOUNTER — Ambulatory Visit: Payer: Self-pay | Admitting: Gastroenterology

## 2017-10-18 ENCOUNTER — Encounter: Payer: Self-pay | Admitting: Internal Medicine

## 2017-12-03 ENCOUNTER — Ambulatory Visit: Payer: Self-pay | Admitting: Internal Medicine

## 2017-12-03 DIAGNOSIS — Z0289 Encounter for other administrative examinations: Secondary | ICD-10-CM

## 2018-06-24 ENCOUNTER — Other Ambulatory Visit: Payer: Self-pay | Admitting: Internal Medicine

## 2018-06-30 ENCOUNTER — Other Ambulatory Visit: Payer: Self-pay | Admitting: Internal Medicine

## 2018-07-09 ENCOUNTER — Emergency Department (HOSPITAL_COMMUNITY): Payer: Medicaid Other

## 2018-07-09 ENCOUNTER — Other Ambulatory Visit: Payer: Self-pay

## 2018-07-09 ENCOUNTER — Encounter (HOSPITAL_COMMUNITY): Payer: Self-pay | Admitting: Emergency Medicine

## 2018-07-09 ENCOUNTER — Emergency Department (HOSPITAL_COMMUNITY)
Admission: EM | Admit: 2018-07-09 | Discharge: 2018-07-09 | Disposition: A | Discharge status: Home / Self Care | Payer: Medicaid Other | Attending: Emergency Medicine | Admitting: Emergency Medicine

## 2018-07-09 ENCOUNTER — Other Ambulatory Visit: Payer: Self-pay | Admitting: Internal Medicine

## 2018-07-09 DIAGNOSIS — Z87891 Personal history of nicotine dependence: Secondary | ICD-10-CM | POA: Insufficient documentation

## 2018-07-09 DIAGNOSIS — I10 Essential (primary) hypertension: Secondary | ICD-10-CM | POA: Diagnosis not present

## 2018-07-09 DIAGNOSIS — Z7982 Long term (current) use of aspirin: Secondary | ICD-10-CM | POA: Diagnosis not present

## 2018-07-09 DIAGNOSIS — Z79899 Other long term (current) drug therapy: Secondary | ICD-10-CM | POA: Diagnosis not present

## 2018-07-09 DIAGNOSIS — J45909 Unspecified asthma, uncomplicated: Secondary | ICD-10-CM | POA: Insufficient documentation

## 2018-07-09 DIAGNOSIS — R079 Chest pain, unspecified: Secondary | ICD-10-CM | POA: Diagnosis present

## 2018-07-09 LAB — CBC
HCT: 38.2 % — ABNORMAL LOW (ref 39.0–52.0)
HEMOGLOBIN: 12.9 g/dL — AB (ref 13.0–17.0)
MCH: 32.8 pg (ref 26.0–34.0)
MCHC: 33.8 g/dL (ref 30.0–36.0)
MCV: 97.2 fL (ref 80.0–100.0)
Platelets: 212 10*3/uL (ref 150–400)
RBC: 3.93 MIL/uL — AB (ref 4.22–5.81)
RDW: 16.4 % — ABNORMAL HIGH (ref 11.5–15.5)
WBC: 4.8 10*3/uL (ref 4.0–10.5)
nRBC: 0 % (ref 0.0–0.2)

## 2018-07-09 LAB — I-STAT TROPONIN, ED
TROPONIN I, POC: 0.01 ng/mL (ref 0.00–0.08)
Troponin i, poc: 0.01 ng/mL (ref 0.00–0.08)

## 2018-07-09 LAB — BASIC METABOLIC PANEL
ANION GAP: 13 (ref 5–15)
BUN: 8 mg/dL (ref 6–20)
CO2: 24 mmol/L (ref 22–32)
Calcium: 9.4 mg/dL (ref 8.9–10.3)
Chloride: 101 mmol/L (ref 98–111)
Creatinine, Ser: 0.85 mg/dL (ref 0.61–1.24)
Glucose, Bld: 100 mg/dL — ABNORMAL HIGH (ref 70–99)
Potassium: 3.5 mmol/L (ref 3.5–5.1)
Sodium: 138 mmol/L (ref 135–145)

## 2018-07-09 MED ORDER — IBUPROFEN 400 MG PO TABS
600.0000 mg | ORAL_TABLET | Freq: Once | ORAL | Status: AC
  Administered 2018-07-09: 600 mg via ORAL
  Filled 2018-07-09: qty 1

## 2018-07-09 MED ORDER — CARVEDILOL 12.5 MG PO TABS
25.0000 mg | ORAL_TABLET | Freq: Two times a day (BID) | ORAL | Status: DC
  Administered 2018-07-09: 25 mg via ORAL
  Filled 2018-07-09: qty 2

## 2018-07-09 NOTE — ED Triage Notes (Signed)
Pt endorses numbness/tingling to right hand x 2 days, central chest discomfort with lightheadedness and headache that began at 1400 this afternoon. No neuro deficits.

## 2018-07-09 NOTE — ED Notes (Signed)
Patient transported to X-ray 

## 2018-07-09 NOTE — ED Triage Notes (Signed)
Pt. Stated, Jamie Stewart had right hand tingling and some chest pain with a headache.
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
## 2018-07-09 NOTE — Discharge Instructions (Addendum)
Follow-up with your primary care doctor about your high blood pressure.  Return to the ED symptoms worsen.

## 2018-07-17 ENCOUNTER — Inpatient Hospital Stay (HOSPITAL_COMMUNITY): Payer: Medicaid Other

## 2018-07-17 ENCOUNTER — Emergency Department (HOSPITAL_COMMUNITY): Payer: Medicaid Other

## 2018-07-17 ENCOUNTER — Inpatient Hospital Stay (HOSPITAL_COMMUNITY)
Admission: EM | Admit: 2018-07-17 | Discharge: 2018-08-01 | DRG: 064 | Disposition: A | Discharge status: Skilled Nursing Facility | Payer: Medicaid Other | Attending: Neurology | Admitting: Neurology

## 2018-07-17 ENCOUNTER — Encounter (HOSPITAL_COMMUNITY): Payer: Self-pay | Admitting: Emergency Medicine

## 2018-07-17 DIAGNOSIS — R29721 NIHSS score 21: Secondary | ICD-10-CM | POA: Diagnosis present

## 2018-07-17 DIAGNOSIS — I674 Hypertensive encephalopathy: Secondary | ICD-10-CM | POA: Diagnosis present

## 2018-07-17 DIAGNOSIS — I619 Nontraumatic intracerebral hemorrhage, unspecified: Secondary | ICD-10-CM | POA: Diagnosis present

## 2018-07-17 DIAGNOSIS — I69391 Dysphagia following cerebral infarction: Secondary | ICD-10-CM | POA: Diagnosis not present

## 2018-07-17 DIAGNOSIS — E785 Hyperlipidemia, unspecified: Secondary | ICD-10-CM | POA: Diagnosis present

## 2018-07-17 DIAGNOSIS — G8191 Hemiplegia, unspecified affecting right dominant side: Secondary | ICD-10-CM | POA: Diagnosis present

## 2018-07-17 DIAGNOSIS — R471 Dysarthria and anarthria: Secondary | ICD-10-CM | POA: Diagnosis present

## 2018-07-17 DIAGNOSIS — E870 Hyperosmolality and hypernatremia: Secondary | ICD-10-CM | POA: Diagnosis not present

## 2018-07-17 DIAGNOSIS — G935 Compression of brain: Secondary | ICD-10-CM | POA: Diagnosis present

## 2018-07-17 DIAGNOSIS — I161 Hypertensive emergency: Secondary | ICD-10-CM | POA: Diagnosis present

## 2018-07-17 DIAGNOSIS — Z8669 Personal history of other diseases of the nervous system and sense organs: Secondary | ICD-10-CM | POA: Diagnosis not present

## 2018-07-17 DIAGNOSIS — I10 Essential (primary) hypertension: Secondary | ICD-10-CM | POA: Diagnosis present

## 2018-07-17 DIAGNOSIS — I610 Nontraumatic intracerebral hemorrhage in hemisphere, subcortical: Principal | ICD-10-CM | POA: Diagnosis present

## 2018-07-17 DIAGNOSIS — G92 Toxic encephalopathy: Secondary | ICD-10-CM | POA: Diagnosis present

## 2018-07-17 DIAGNOSIS — E871 Hypo-osmolality and hyponatremia: Secondary | ICD-10-CM | POA: Diagnosis present

## 2018-07-17 DIAGNOSIS — G936 Cerebral edema: Secondary | ICD-10-CM | POA: Diagnosis present

## 2018-07-17 DIAGNOSIS — R4701 Aphasia: Secondary | ICD-10-CM | POA: Diagnosis present

## 2018-07-17 DIAGNOSIS — I615 Nontraumatic intracerebral hemorrhage, intraventricular: Secondary | ICD-10-CM | POA: Diagnosis present

## 2018-07-17 DIAGNOSIS — M479 Spondylosis, unspecified: Secondary | ICD-10-CM | POA: Diagnosis present

## 2018-07-17 DIAGNOSIS — D509 Iron deficiency anemia, unspecified: Secondary | ICD-10-CM | POA: Diagnosis present

## 2018-07-17 DIAGNOSIS — Z79899 Other long term (current) drug therapy: Secondary | ICD-10-CM | POA: Diagnosis not present

## 2018-07-17 DIAGNOSIS — G40909 Epilepsy, unspecified, not intractable, without status epilepticus: Secondary | ICD-10-CM

## 2018-07-17 DIAGNOSIS — J690 Pneumonitis due to inhalation of food and vomit: Secondary | ICD-10-CM

## 2018-07-17 LAB — CBC
HCT: 38.1 % — ABNORMAL LOW (ref 39.0–52.0)
HEMOGLOBIN: 12.8 g/dL — AB (ref 13.0–17.0)
MCH: 33.1 pg (ref 26.0–34.0)
MCHC: 33.6 g/dL (ref 30.0–36.0)
MCV: 98.4 fL (ref 80.0–100.0)
Platelets: 131 10*3/uL — ABNORMAL LOW (ref 150–400)
RBC: 3.87 MIL/uL — ABNORMAL LOW (ref 4.22–5.81)
RDW: 16.4 % — ABNORMAL HIGH (ref 11.5–15.5)
WBC: 5.6 10*3/uL (ref 4.0–10.5)
nRBC: 0 % (ref 0.0–0.2)

## 2018-07-17 LAB — DIFFERENTIAL
ABS IMMATURE GRANULOCYTES: 0.04 10*3/uL (ref 0.00–0.07)
Basophils Absolute: 0 10*3/uL (ref 0.0–0.1)
Basophils Relative: 1 %
Eosinophils Absolute: 0.2 10*3/uL (ref 0.0–0.5)
Eosinophils Relative: 3 %
Immature Granulocytes: 1 %
Lymphocytes Relative: 13 %
Lymphs Abs: 0.7 10*3/uL (ref 0.7–4.0)
Monocytes Absolute: 0.5 10*3/uL (ref 0.1–1.0)
Monocytes Relative: 8 %
NEUTROS ABS: 4.2 10*3/uL (ref 1.7–7.7)
Neutrophils Relative %: 74 %

## 2018-07-17 LAB — COMPREHENSIVE METABOLIC PANEL
ALT: 34 U/L (ref 0–44)
AST: 37 U/L (ref 15–41)
Albumin: 4.4 g/dL (ref 3.5–5.0)
Alkaline Phosphatase: 48 U/L (ref 38–126)
Anion gap: 23 — ABNORMAL HIGH (ref 5–15)
BUN: 7 mg/dL (ref 6–20)
CO2: 19 mmol/L — AB (ref 22–32)
Calcium: 9.1 mg/dL (ref 8.9–10.3)
Chloride: 103 mmol/L (ref 98–111)
Creatinine, Ser: 0.83 mg/dL (ref 0.61–1.24)
GFR calc Af Amer: >60 mL/min (ref >60)
GFR calc non Af Amer: >60 mL/min (ref >60)
GLUCOSE: 119 mg/dL — AB (ref 70–99)
Potassium: 3.7 mmol/L (ref 3.5–5.1)
Sodium: 145 mmol/L (ref 135–145)
Total Bilirubin: 0.9 mg/dL (ref 0.3–1.2)
Total Protein: 8.4 g/dL — ABNORMAL HIGH (ref 6.5–8.1)

## 2018-07-17 LAB — APTT: aPTT: 24 seconds (ref 24–36)

## 2018-07-17 LAB — MRSA PCR SCREENING: MRSA by PCR: NEGATIVE

## 2018-07-17 LAB — PROTIME-INR
INR: 1.03
Prothrombin Time: 13.4 seconds (ref 11.4–15.2)

## 2018-07-17 LAB — SODIUM: Sodium: 140 mmol/L (ref 135–145)

## 2018-07-17 LAB — ETHANOL: Alcohol, Ethyl (B): 24 mg/dL — ABNORMAL HIGH (ref <10)

## 2018-07-17 LAB — I-STAT CREATININE, ED: Creatinine, Ser: 0.6 mg/dL — ABNORMAL LOW (ref 0.61–1.24)

## 2018-07-17 LAB — CBG MONITORING, ED: Glucose-Capillary: 108 mg/dL — ABNORMAL HIGH (ref 70–99)

## 2018-07-17 MED ORDER — STROKE: EARLY STAGES OF RECOVERY BOOK
Freq: Once | Status: AC
  Administered 2018-07-17: 1

## 2018-07-17 MED ORDER — LEVETIRACETAM IN NACL 500 MG/100ML IV SOLN
500.0000 mg | Freq: Two times a day (BID) | INTRAVENOUS | Status: DC
  Administered 2018-07-18 – 2018-07-21 (×7): 500 mg via INTRAVENOUS
  Filled 2018-07-17 (×7): qty 100

## 2018-07-17 MED ORDER — IOPAMIDOL (ISOVUE-370) INJECTION 76%
75.0000 mL | Freq: Once | INTRAVENOUS | Status: AC | PRN
  Administered 2018-07-17: 75 mL via INTRAVENOUS

## 2018-07-17 MED ORDER — LABETALOL HCL 5 MG/ML IV SOLN
INTRAVENOUS | Status: AC
  Filled 2018-07-17: qty 4

## 2018-07-17 MED ORDER — SODIUM CHLORIDE 3 % IV SOLN
INTRAVENOUS | Status: AC
  Administered 2018-07-17 – 2018-07-18 (×4): 75 mL/h via INTRAVENOUS
  Filled 2018-07-17 (×5): qty 500

## 2018-07-17 MED ORDER — LABETALOL HCL 5 MG/ML IV SOLN
20.0000 mg | Freq: Once | INTRAVENOUS | Status: AC
  Administered 2018-07-17: 10 mg via INTRAVENOUS

## 2018-07-17 MED ORDER — SENNOSIDES-DOCUSATE SODIUM 8.6-50 MG PO TABS
1.0000 | ORAL_TABLET | Freq: Two times a day (BID) | ORAL | Status: DC
  Administered 2018-07-18 – 2018-07-31 (×27): 1 via ORAL
  Filled 2018-07-17 (×27): qty 1

## 2018-07-17 MED ORDER — PANTOPRAZOLE SODIUM 40 MG IV SOLR
40.0000 mg | Freq: Every day | INTRAVENOUS | Status: DC
  Administered 2018-07-17 – 2018-07-20 (×4): 40 mg via INTRAVENOUS
  Filled 2018-07-17 (×4): qty 40

## 2018-07-17 MED ORDER — LEVETIRACETAM IN NACL 1000 MG/100ML IV SOLN
1000.0000 mg | INTRAVENOUS | Status: AC
  Administered 2018-07-17: 1000 mg via INTRAVENOUS
  Filled 2018-07-17: qty 100

## 2018-07-17 MED ORDER — ACETAMINOPHEN 650 MG RE SUPP: 650.0000 mg | RECTAL | Status: DC | PRN

## 2018-07-17 MED ORDER — ACETAMINOPHEN 160 MG/5ML PO SOLN
650.0000 mg | ORAL | Status: DC | PRN
  Administered 2018-07-22: 650 mg
  Filled 2018-07-17: qty 20.3

## 2018-07-17 MED ORDER — CLEVIDIPINE BUTYRATE 0.5 MG/ML IV EMUL
0.0000 mg/h | INTRAVENOUS | Status: DC
  Administered 2018-07-17: 13 mg/h via INTRAVENOUS
  Administered 2018-07-17: 1 mg/h via INTRAVENOUS
  Administered 2018-07-17: 16 mg/h via INTRAVENOUS
  Administered 2018-07-18: 21 mg/h via INTRAVENOUS
  Administered 2018-07-18 (×2): 12 mg/h via INTRAVENOUS
  Administered 2018-07-18: 21 mg/h via INTRAVENOUS
  Administered 2018-07-18: 14 mg/h via INTRAVENOUS
  Administered 2018-07-18: 21 mg/h via INTRAVENOUS
  Administered 2018-07-18: 16 mg/h via INTRAVENOUS
  Administered 2018-07-18: 14 mg/h via INTRAVENOUS
  Administered 2018-07-19: 9 mg/h via INTRAVENOUS
  Administered 2018-07-19: 21 mg/h via INTRAVENOUS
  Administered 2018-07-19: 19 mg/h via INTRAVENOUS
  Filled 2018-07-17: qty 100
  Filled 2018-07-17 (×3): qty 50
  Filled 2018-07-17: qty 100
  Filled 2018-07-17 (×4): qty 50
  Filled 2018-07-17: qty 100
  Filled 2018-07-17 (×2): qty 50

## 2018-07-17 MED ORDER — ACETAMINOPHEN 325 MG PO TABS
650.0000 mg | ORAL_TABLET | ORAL | Status: DC | PRN
  Administered 2018-07-18 – 2018-07-23 (×7): 650 mg via ORAL
  Filled 2018-07-17 (×7): qty 2

## 2018-07-17 NOTE — ED Triage Notes (Signed)
Pt arrives via EMS [id number]. Pt roommate found him unresponsive at 1830 today. Hx stroke, seizures. Per EMS pt has not been feeling well the last 2 days. Febrile at 101 and incontinent of urine. EMS also reports R sided weakness and L sided gaze. 156/89, RR 24, HR 90, CBG 115.

## 2018-07-17 NOTE — H&P (Signed)
Neurology Consultation  Reason for Consult: stroke alert, RSW, left gaze Referring Physician: Dr Melene Planan Floyd  CC: Right-sided weakness, leftward gaze  History is obtained from: Chart, EMS  HPI: Jamie Stewart is a 58 y.o. male with no documented history, based on his medications bottles, history of seizures and hypertension hyperlipidemia, presented to the emergency room via EMS when the roommate called EMS for the patient being unresponsive and having a leftward gaze.  Stroke alert-LVO positive was activated. Patient is completely aphasic unable to provide any history. According to EMS, the patient has been unwell for past 2 to 3 days but was at least talking and walking this morning and since 7 AM has been not talking and not been able to walk.  When they assessed him first, he was flaccid on the right side as well as had a leftward gaze with no verbal output. Evaluation on the bridge-NIH stroke scale 21 signs of left cerebral dysfunction-see details below.  Of note, patient also had a temperature of 101 Fahrenheit.  He had also been incontinent of urine.  LKW: Best of our history taking-7 AM on 07/17/2018 but has been sick for the past 2 days so rather unclear. tpa given?: no, ICH Premorbid modified Rankin scale (mRS): Unknown.  No family around.  No other attendant reachable at this time.  ROS: Unable to obtain due to aphasia  No past medical history on file. Based on review of the medications that accompanied him- hypertension, hyperlipidemia, seizures.  Keppra bottle was empty.  No family history on file. Patient aphasic unable to provide Social History:   has no history on file for tobacco, alcohol, and drug. Patient aphasic unable to provide  Medications-Home medications irbesartan, simvastatin, aspirin and Keppra.  Current Facility-Administered Medications:  .   stroke: mapping our early stages of recovery book, , Does not apply, Once, Milon DikesArora, Aleeah Greeno, MD .  acetaminophen  (TYLENOL) tablet 650 mg, 650 mg, Oral, Q4H PRN **OR** acetaminophen (TYLENOL) solution 650 mg, 650 mg, Per Tube, Q4H PRN **OR** acetaminophen (TYLENOL) suppository 650 mg, 650 mg, Rectal, Q4H PRN, Milon DikesArora, Shaneil Yazdi, MD .  labetalol (NORMODYNE,TRANDATE) injection 20 mg, 20 mg, Intravenous, Once **AND** clevidipine (CLEVIPREX) infusion 0.5 mg/mL, 0-21 mg/hr, Intravenous, Continuous, Milon DikesArora, Humphrey Guerreiro, MD .  iopamidol (ISOVUE-370) 76 % injection 75 mL, 75 mL, Intravenous, Once PRN, Milon DikesArora, Honestie Kulik, MD .  pantoprazole (PROTONIX) injection 40 mg, 40 mg, Intravenous, QHS, Milon DikesArora, Abisai Deer, MD .  senna-docusate (Senokot-S) tablet 1 tablet, 1 tablet, Oral, BID, Milon DikesArora, Dinia Joynt, MD No current outpatient medications on file.  Exam: Current vital signs: BP (!) 154/100 (BP Location: Left Arm)   Pulse 92   Temp 100.2 F (37.9 C) (Axillary)   Resp (!) 22   SpO2 96%  Vital signs in last 24 hours: Temp:  [100.2 F (37.9 C)] 100.2 F (37.9 C) (02/13 2010) Pulse Rate:  [92] 92 (02/13 2010) Resp:  [22] 22 (02/13 2010) BP: (154)/(100) 154/100 (02/13 2010) SpO2:  [96 %] 96 % (02/13 2010) General: Awake alert in no distress HEENT: Normocephalic atraumatic dry mucous membranes Lungs: Clear to auscultation excellent cardiovascular: Sounds are regular rate rhythm Abdomen soft nondistended nontender Exam of the extremities revealed no edema, warm well perfused with intact pulses Neurological exam Mental status: Awake alert nonverbal. Does not follow any commands. No speech output. Cranial nerves: Pupils equal round reactive to light, forced leftward gaze, does not blink to threat from the right, blinks to threat from the left, right nasolabial fold flattening. Motor: Increased  tone in the right upper extremity with no movement to noxious simulation.  Minimal movement of the right lower extremity not stimulation.  Spontaneous moving left upper and lower extremity. Sensory exam: Decreased grimace and withdrawal to noxious  simulation on the left Unable to perform coordination due to inability to follow commands Gait testing deferred at this time NIH stroke scale-21  Labs I have reviewed labs in epic and the results pertinent to this consultation are:  CBC    Component Value Date/Time   WBC 5.6 07/17/2018 1926   RBC 3.87 (L) 07/17/2018 1926   HGB 12.8 (L) 07/17/2018 1926   HCT 38.1 (L) 07/17/2018 1926   PLT 131 (L) 07/17/2018 1926   MCV 98.4 07/17/2018 1926   MCH 33.1 07/17/2018 1926   MCHC 33.6 07/17/2018 1926   RDW 16.4 (H) 07/17/2018 1926   LYMPHSABS 0.7 07/17/2018 1926   MONOABS 0.5 07/17/2018 1926   EOSABS 0.2 07/17/2018 1926   BASOSABS 0.0 07/17/2018 1926   CMP     Component Value Date/Time   CREATININE 0.60 (L) 07/17/2018 1933   Imaging I have reviewed the images obtained:  CT-scan of the brain-left basal ganglia bleed measuring about 100 cc with extension into the intraventricular space, midline shift of 7 mm with no downward herniation.  CTA head no LVO and no spot sign.  Assessment: 58 year old man with past medical history presumably of hypertension, hyperlipidemia and seizures presenting for evaluation of unresponsiveness, right-sided weakness and leftward gaze. Examination consistent with a left cerebral dysfunction and CT head reveals a large left basal ganglia bleed with extension into the ventricles. There is midline shift of 7 mm with some cytotoxic edema around the bleed. Most likely hypertensive bleed although patient's maximum noted blood pressure was in the 150s. He will be admitted to the neurological ICU for further work-up and neuro monitoring. Assessment:   Plan: Subcortical ICH, nontraumatic Acuity: Acute Laterality: left BG Current suspected etiology:  HTN Treatment: -Admit to NICU -ICH Score: 3 -ICH Volume: ~100cc -BP control goal SYS<140 -PT/OT/ST  -neuromonitoring  CNS Cerebral edema Compression of brain -Hyperosmolar therapy  - 3% saline via  peripheral. Consider central line -Repeat CTH at 0200 hrs. Consider NSGY consult if worse for EVD or evac. -Close neuro monitoring  Seizures history -Load with Keppra 1 g IV now -Continue Keppra 500 twice daily -EEG in the morning  Dysarthria Dysphagia following ICH  -NPO until cleared by speech -ST -May need PEG  Toxic encephalopathy Anoxic encephalopathy -Correct metabolic causes -Monitor  Hemiplegia and hemiparesis following nontraumatic intracerebral hemorrhage affecting right dominant side  -Continue PT/OT/ST  RESP No acute changes for now Monitor clinically. May need intubation-low threshold  CV Hypertensive Emergency -Aggressive BP control, goal SBP < 140 -Labetalol followed by Cleviprex.  Titrate to goal -TTE  GI/GU -Gentle hydration  HEME Iron Deficiency Anemia -Monitor -transfuse for hgb < 7 Normal PT/INR.  No evidence of coagulopathy  ENDO Labs 9 -goal HgbA1c < 7  Fluid/Electrolyte Disorders Labs pending. Trend and replete as necessary  ID Febrile on arrival. Blood cultures sputum culture and urine culture  Possible Aspiration PNA -CXR -NPO -Monitor  Possible UTI -Urinalysis and urine culture CX pending  Prophylaxis DVT: SCDs GI: Protonix Bowel: Docusate/senna  Dispo: IP Rehab likely  Diet: NPO until cleared by speech  Code Status: Full Code for now.  No family member attending to provide any information on advanced directives.   THE FOLLOWING WERE PRESENT ON ADMISSION: Intracerebral hemorrhage, hypertensive encephalopathy, cerebral  edema, hemiplegia, possible aspiration pneumonia, possible UTI,  -- Milon DikesAshish Marshae Azam, MD [facility] Pager: [phone] If 7pm to 7am, please call on call as listed on AMION.   CRITICAL CARE ATTESTATION Performed by: Milon DikesAshish Zaide Kardell, MD Total critical care time: 60 minutes Critical care time was exclusive of separately billable procedures and treating other patients and/or supervising  APPs/Residents/Students Critical care was necessary to treat or prevent imminent or life-threatening deterioration due to ICH This patient is critically ill and at significant risk for neurological worsening and/or death and care requires constant monitoring. Critical care was time spent personally by me on the following activities: development of treatment plan with patient and/or surrogate as well as nursing, discussions with consultants, evaluation of patient's response to treatment, examination of patient, obtaining history from patient or surrogate, ordering and performing treatments and interventions, ordering and review of laboratory studies, ordering and review of radiographic studies, pulse oximetry, re-evaluation of patient's condition, participation in multidisciplinary rounds and medical decision making of high complexity in the care of this patient.

## 2018-07-17 NOTE — ED Provider Notes (Signed)
[facility] EMERGENCY DEPARTMENT Provider Note   CSN: [id number] Arrival date & time: 07/17/18  1922     History   Chief Complaint No chief complaint on file.   HPI Ricahrd Mountford is a 58 y.o. male.  58 yo M with a chief complaint of right-sided paralysis and left-sided gaze deviation.  This was noted this afternoon by the roomate.  Made a code stroke and arrived to the ED.  Level 5 caveat patient is nonverbal  The history is provided by the EMS personnel and medical records. The history is limited by the condition of the patient.    History reviewed. No pertinent past medical history.  Patient Active Problem List   Diagnosis Date Noted  . ICH (intracerebral hemorrhage) (HCC) 07/17/2018    History reviewed. No pertinent surgical history.      Home Medications    Prior to Admission medications   Not on File    Family History No family history on file.  Social History Social History   Tobacco Use  . Smoking status: Not on file  Substance Use Topics  . Alcohol use: Not on file  . Drug use: Not on file     Allergies   Patient has no allergy information on record.   Review of Systems Review of Systems  Unable to perform ROS: Patient nonverbal     Physical Exam Updated Vital Signs There were no vitals taken for this visit.  Physical Exam Vitals signs and nursing note reviewed.  Constitutional:      Appearance: He is well-developed.  HENT:     Head: Normocephalic and atraumatic.  Eyes:     Pupils: Pupils are equal, round, and reactive to light.  Neck:     Musculoskeletal: Normal range of motion and neck supple.     Vascular: No JVD.  Cardiovascular:     Rate and Rhythm: Normal rate and regular rhythm.     Heart sounds: No murmur. No friction rub. No gallop.   Pulmonary:     Effort: No respiratory distress.     Breath sounds: No wheezing.  Abdominal:     General: There is no distension.     Tenderness: There is no  guarding or rebound.  Musculoskeletal: Normal range of motion.  Skin:    Coloration: Skin is not pale.     Findings: No rash.  Neurological:     Mental Status: He is alert.     GCS: GCS eye subscore is 4. GCS verbal subscore is 2.     Comments: Left-sided gaze deviation right-sided neglect right-sided paralysis      ED Treatments / Results  Labs (all labs ordered are listed, but only abnormal results are displayed) Labs Reviewed  ETHANOL - Abnormal; Notable for the following components:      Result Value   Alcohol, Ethyl (B) 24 (*)    All other components within normal limits  CBC - Abnormal; Notable for the following components:   RBC 3.87 (*)    Hemoglobin 12.8 (*)    HCT 38.1 (*)    RDW 16.4 (*)    Platelets 131 (*)    All other components within normal limits  I-STAT CREATININE, ED - Abnormal; Notable for the following components:   Creatinine, Ser 0.60 (*)    All other components within normal limits  CBG MONITORING, ED - Abnormal; Notable for the following components:   Glucose-Capillary 108 (*)    All other components within normal  limits  PROTIME-INR  APTT  DIFFERENTIAL  BASIC METABOLIC PANEL  COMPREHENSIVE METABOLIC PANEL  RAPID URINE DRUG SCREEN, HOSP PERFORMED  URINALYSIS, ROUTINE W REFLEX MICROSCOPIC  HIV ANTIBODY (ROUTINE TESTING W REFLEX)    EKG None  Radiology No results found.  Procedures Procedures (including critical care time)  Medications Ordered in ED Medications   stroke: mapping our early stages of recovery book (has no administration in time range)  acetaminophen (TYLENOL) tablet 650 mg (has no administration in time range)    Or  acetaminophen (TYLENOL) solution 650 mg (has no administration in time range)    Or  acetaminophen (TYLENOL) suppository 650 mg (has no administration in time range)  senna-docusate (Senokot-S) tablet 1 tablet (has no administration in time range)  pantoprazole (PROTONIX) injection 40 mg (has no  administration in time range)  labetalol (NORMODYNE,TRANDATE) injection 20 mg (has no administration in time range)    And  clevidipine (CLEVIPREX) infusion 0.5 mg/mL (has no administration in time range)  iopamidol (ISOVUE-370) 76 % injection 75 mL (75 mLs Intravenous Contrast Given 07/17/18 1953)     Initial Impression / Assessment and Plan / ED Course  I have reviewed the triage vital signs and the nursing notes.  Pertinent labs & imaging results that were available during my care of the patient were reviewed by me and considered in my medical decision making (see chart for details).     58 yo M with a chief complaint of strokelike symptoms, patient was made a code stroke on arrival airway was cleared he was taken urgently back to CT.  CT was concerning for a large left-sided hemorrhagic stroke.  He is maintaining his airway on my initial exam.  Blood pressures are below 140 systolic.  Admit to the neuro ICU.  CRITICAL CARE Performed by: Rae Roamaniel Patrick Marion Rosenberry   Total critical care time: 35 minutes  Critical care time was exclusive of separately billable procedures and treating other patients.  Critical care was necessary to treat or prevent imminent or life-threatening deterioration.  Critical care was time spent personally by me on the following activities: development of treatment plan with patient and/or surrogate as well as nursing, discussions with consultants, evaluation of patient's response to treatment, examination of patient, obtaining history from patient or surrogate, ordering and performing treatments and interventions, ordering and review of laboratory studies, ordering and review of radiographic studies, pulse oximetry and re-evaluation of patient's condition.   Final Clinical Impressions(s) / ED Diagnoses   Final diagnoses:  Left-sided nontraumatic intraventricular intracerebral hemorrhage [facility](HCC)    ED Discharge Orders    None       Melene PlanFloyd, Burrell Hodapp, DO 07/17/18  2001

## 2018-07-17 NOTE — Code Documentation (Addendum)
Code Stroke - paged at 1910. LSW 0700 (AM). Has been lethargic x 2 days, was found unresponsive at 1830P. Right sided weakness, LT Gaze. + Hx of CVA and seizures. NIH 21. Febrile 101 and CBG 115   CT + large left basal ganglia bleed with extension into the ventricles. There is midline shift of 7 mm with some cytotoxic edema around the bleed. Taken back to ED, given Labetalol 10mg  IV for SBP in the 150s.

## 2018-07-18 ENCOUNTER — Inpatient Hospital Stay (HOSPITAL_COMMUNITY): Payer: Medicaid Other

## 2018-07-18 DIAGNOSIS — I615 Nontraumatic intracerebral hemorrhage, intraventricular: Secondary | ICD-10-CM

## 2018-07-18 DIAGNOSIS — G935 Compression of brain: Secondary | ICD-10-CM | POA: Diagnosis present

## 2018-07-18 DIAGNOSIS — G936 Cerebral edema: Secondary | ICD-10-CM | POA: Diagnosis present

## 2018-07-18 DIAGNOSIS — I619 Nontraumatic intracerebral hemorrhage, unspecified: Secondary | ICD-10-CM

## 2018-07-18 LAB — HIV ANTIBODY (ROUTINE TESTING W REFLEX): HIV Screen 4th Generation wRfx: NONREACTIVE

## 2018-07-18 LAB — SODIUM
SODIUM: 142 mmol/L (ref 135–145)
Sodium: 142 mmol/L (ref 135–145)
Sodium: 143 mmol/L (ref 135–145)
Sodium: 141 mmol/L (ref 135–145)

## 2018-07-18 LAB — ECHOCARDIOGRAM COMPLETE

## 2018-07-18 MED ORDER — RESOURCE THICKENUP CLEAR PO POWD
ORAL | Status: DC | PRN
  Filled 2018-07-18: qty 125

## 2018-07-18 MED ORDER — CHLORHEXIDINE GLUCONATE 0.12 % MT SOLN
15.0000 mL | Freq: Two times a day (BID) | OROMUCOSAL | Status: DC
  Administered 2018-07-18 – 2018-07-31 (×28): 15 mL via OROMUCOSAL
  Filled 2018-07-18 (×24): qty 15

## 2018-07-18 MED ORDER — ORAL CARE MOUTH RINSE
15.0000 mL | Freq: Two times a day (BID) | OROMUCOSAL | Status: DC
  Administered 2018-07-19 – 2018-07-31 (×19): 15 mL via OROMUCOSAL

## 2018-07-18 MED ORDER — GADOBUTROL 1 MMOL/ML IV SOLN
9.0000 mL | Freq: Once | INTRAVENOUS | Status: AC | PRN
  Administered 2018-07-18: 9 mL via INTRAVENOUS

## 2018-07-18 MED ORDER — SODIUM CHLORIDE 3 % IV SOLN
INTRAVENOUS | Status: DC
  Administered 2018-07-19: 75 mL/h via INTRAVENOUS
  Filled 2018-07-18 (×3): qty 500

## 2018-07-18 NOTE — Progress Notes (Signed)
PT Cancellation Note  Patient Details Name: Jamie Stewart MRN: [id number] DOB: 08/21/1955   Cancelled Treatment:    Reason Eval/Treat Not Completed: Medical issues which prohibited therapy(Pt had 5 tests and was not in room each time checked on. )   Zulay Corrie F Fredi Geiler 07/18/2018, 2:41 PM  Jamonte Curfman,PT Acute Rehabilitation Services Pager:  [phone]  Office:  [phone]

## 2018-07-18 NOTE — Progress Notes (Signed)
Awake alert at 0600. Moving left side spontaneously with full strength.  Repeat head CT with not  Much change.  Continue current management. Do not see a need for emergent neurosurgical intervention at this time. Will closely follow. Repeat CTH at 0900.  -- Milon Dikes, MD [facility] Pager: [phone] If 7pm to 7am, please call on call as listed on AMION.

## 2018-07-18 NOTE — Progress Notes (Signed)
PT Cancellation Note  Patient Details Name: Jamie Stewart MRN: [id number] DOB: 08/21/1955   Cancelled Treatment:    Reason Eval/Treat Not Completed: Medical issues which prohibited therapy;Other (comment)(nurse states to check back in pm)  Karoline Caldwell, [place] [phone]  Crystal Scarberry 07/18/2018, 9:09 AM

## 2018-07-18 NOTE — Progress Notes (Addendum)
STROKE TEAM PROGRESS NOTE   INTERVAL HISTORY His RN and EEG tech are at the bedside.  He looks better than his scan. Awake. Follows some commands. EOMI. Concern for hmg etiology.his blood pressure seems to be adequately controlled.  Vitals:   07/18/18 0945 07/18/18 0947 07/18/18 1015 07/18/18 1030  BP: 120/77 (!) 136/93 130/86 139/87  Pulse: (!) 108 (!) 105  (!) 103  Resp:      Temp:      TempSrc:      SpO2: 95% 96%  96%    CBC:  Recent Labs  Lab 07/17/18 1926  WBC 5.6  NEUTROABS 4.2  HGB 12.8*  HCT 38.1*  MCV 98.4  PLT 131*    Basic Metabolic Panel:  Recent Labs  Lab 07/17/18 1926 07/17/18 1933  07/18/18 0243 07/18/18 0745  NA 145  --    < > 142 142  K 3.7  --   --   --   --   CL 103  --   --   --   --   CO2 19*  --   --   --   --   GLUCOSE 119*  --   --   --   --   BUN 7  --   --   --   --   CREATININE 0.83 0.60*  --   --   --   CALCIUM 9.1  --   --   --   --    < > = values in this interval not displayed.   Lipid Panel: No results found for: CHOL, TRIG, HDL, CHOLHDL, VLDL, LDLCALC HgbA1c: No results found for: HGBA1C Urine Drug Screen: No results found for: LABOPIA, COCAINSCRNUR, LABBENZ, AMPHETMU, THCU, LABBARB  Alcohol Level     Component Value Date/Time   ETH 24 (H) 07/17/2018 1926    IMAGING Ct Angio Head W Or Wo Contrast  Result Date: 07/17/2018 CLINICAL DATA:  58 y/o M; right-sided weakness, left-sided gaze, nonverbal, code stroke. EXAM: CT HEAD WITHOUT CONTRAST CT ANGIOGRAPHY HEAD AND NECK TECHNIQUE: CT of the head was performed from skull base to vertex without intravenous contrast administration. Multidetector CT imaging of the head and neck was performed using the standard protocol during bolus administration of intravenous contrast. Multiplanar CT image reconstructions and MIPs were obtained to evaluate the vascular anatomy. Carotid stenosis measurements (when applicable) are obtained utilizing NASCET criteria, using the distal internal carotid  diameter as the denominator. CONTRAST:  75mL ISOVUE-370 IOPAMIDOL (ISOVUE-370) INJECTION 76% COMPARISON:  None. FINDINGS: CT HEAD FINDINGS Brain: Large brain parenchymal hemorrhage centered within left basal ganglia measuring 8.8 x 4.5 x 5.2 cm (volume = 110 cm^3)(AP x ML x CC series 3, image 20 and series 5, image 43). Trace volume of hemorrhage is present within the occipital horn of right lateral ventricle. Edema and mass effect associated with the hematoma effaces the left lateral ventricle and results in 6 mm of left-to-right midline shift. The right lateral ventricle is mildly enlarged which may be due to parenchymal volume loss and ex vacuo dilatation from chronic infarction or possibly early ventricular entrapment, attention at follow-up recommended. 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 There are moderate chronic microvascular ischemic changes of white matter and volume loss of the brain. Vascular: As below. Skull: Normal. Negative for fracture or focal lesion. Sinuses: Mild mucosal thickening within the paranasal sinuses. No sinus fluid level. Normal aeration of mastoid air cells. Orbits are unremarkable.  Orbits: No acute finding. Review of the MIP images confirms the above findings CTA NECK FINDINGS Aortic arch: Four-vessel variant branching. Imaged portion shows no evidence of aneurysm or dissection. No significant stenosis of the major arch vessel origins. Right carotid system: No evidence of dissection, stenosis (50% or greater) or occlusion. Mild non stenotic calcific atherosclerosis of carotid bifurcation. Left carotid system: No evidence of dissection, stenosis (50% or greater) or occlusion. Vertebral arteries: Codominant. No evidence of dissection, stenosis (50% or greater) or occlusion. Skeleton: Mild spondylosis of the cervical spine. No high-grade bony spinal canal stenosis. No acute osseous abnormality is evident Other neck: Negative. Upper chest:  Negative. Review of the MIP images confirms the above findings CTA HEAD FINDINGS Anterior circulation: No significant stenosis, proximal occlusion, aneurysm, or vascular malformation. Posterior circulation: No significant stenosis, proximal occlusion, aneurysm, or vascular malformation. Venous sinuses: As permitted by contrast timing, patent. Anatomic variants: None significant. Delayed phase: No abnormal intracranial enhancement. Review of the MIP images confirms the above findings IMPRESSION: CT head: 1. Large hematoma centered within the left basal ganglia measuring up to 8.8 cm, 110 cc. Associated edema and mass effect effaces the left lateral ventricle and results in 6 mm of left-to-right midline shift. 2. Trace volume of hemorrhage within the occipital horn of right lateral ventricle. Right lateral ventriculomegaly may be due to ex vacuo dilatation from brain parenchymal volume loss and chronic right inferolateral frontal lobe infarction or possibly early entrapment, attention at follow-up recommended. CTA neck: 1. Patent carotid and vertebral arteries. No dissection, aneurysm, or hemodynamically significant stenosis utilizing NASCET criteria. 2. Mild spondylosis of the cervical spine. CTA head: 1. Patent anterior and posterior intracranial circulation. No large vessel occlusion, aneurysm, vascular malformation, or significant stenosis identified. These results were called by telephone at the time of interpretation on 07/17/2018 at 7:50 pm to Dr. Milon Dikes , who verbally acknowledged these results. Electronically Signed   By: Mitzi Hansen M.D.   On: 07/17/2018 20:16   Dg Chest 1 View  Result Date: 07/17/2018 CLINICAL DATA:  Aspiration pneumonia. EXAM: CHEST  1 VIEW COMPARISON:  None. FINDINGS: Mild cardiomegaly is noted. Both lungs are clear. The visualized skeletal structures are unremarkable. IMPRESSION: No acute cardiopulmonary abnormality seen. Electronically Signed   By: Lupita Raider,  M.D.   On: 07/17/2018 20:59   Ct Head Wo Contrast  Result Date: 07/18/2018 CLINICAL DATA:  Follow-up examination for intracranial hemorrhage. EXAM: CT HEAD WITHOUT CONTRAST TECHNIQUE: Contiguous axial images were obtained from the base of the skull through the vertex without intravenous contrast. COMPARISON:  Prior CT from 07/17/2018. FINDINGS: Brain: Large intraparenchymal hematoma centered at the left basal ganglia is perhaps slightly contracted from previous measuring approximately 106 cc, previously 110 cc). Surrounding vasogenic edema with regional mass effect relatively similar. Adjacent left lateral ventricle largely effaced with up to 8 mm of left-to-right midline shift. Evidence for intraventricular extension with blood in the lateral ventricles as well as the cerebral aqueduct, slightly increased from previous. Slight asymmetric dilatation of the temporal horn of the right lateral ventricle has slightly worsened, possibly reflecting developing ventricular trapping. No other new intracranial hemorrhage. No extra-axial fluid collection. No acute large vessel territory infarct. Vascular: No new hyperdense vessel. Calcified atherosclerosis at the skull base. Skull: Scalp soft tissues and calvarium demonstrate no acute finding. Sinuses/Orbits: Globes and orbital soft tissues within normal limits. Mild mucosal thickening within the paranasal sinuses. Mastoid air cells remain clear. Other: None. IMPRESSION: 1. Slight interval contraction of large  hematoma centered at the left basal ganglia, now measuring 106 cc, previously 110 cc. Associated intraventricular extension with small volume intraventricular blood, slightly increased from previous. 2. Similar regional mass effect with up to 8 mm left-to-right shift. Slightly worsened dilatation of the temporal horn of the right lateral ventricle, which could reflect developing ventricular trapping. Attention at follow-up. 3. No other new acute intracranial  abnormality. Electronically Signed   By: Rise Mu M.D.   On: 07/18/2018 03:09   Ct Angio Neck W Or Wo Contrast  Result Date: 07/17/2018 CLINICAL DATA:  58 y/o M; right-sided weakness, left-sided gaze, nonverbal, code stroke. EXAM: CT HEAD WITHOUT CONTRAST CT ANGIOGRAPHY HEAD AND NECK TECHNIQUE: CT of the head was performed from skull base to vertex without intravenous contrast administration. Multidetector CT imaging of the head and neck was performed using the standard protocol during bolus administration of intravenous contrast. Multiplanar CT image reconstructions and MIPs were obtained to evaluate the vascular anatomy. Carotid stenosis measurements (when applicable) are obtained utilizing NASCET criteria, using the distal internal carotid diameter as the denominator. CONTRAST:  75mL ISOVUE-370 IOPAMIDOL (ISOVUE-370) INJECTION 76% COMPARISON:  None. FINDINGS: CT HEAD FINDINGS Brain: Large brain parenchymal hemorrhage centered within left basal ganglia measuring 8.8 x 4.5 x 5.2 cm (volume = 110 cm^3)(AP x ML x CC series 3, image 20 and series 5, image 43). Trace volume of hemorrhage is present within the occipital horn of right lateral ventricle. Edema and mass effect associated with the hematoma effaces the left lateral ventricle and results in 6 mm of left-to-right midline shift. The right lateral ventricle is mildly enlarged which may be due to parenchymal volume loss and ex vacuo dilatation from chronic infarction or possibly early ventricular entrapment, attention at follow-up recommended. 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 There are moderate chronic microvascular ischemic changes of white matter and volume loss of the brain. Vascular: As below. Skull: Normal. Negative for fracture or focal lesion. Sinuses: Mild mucosal thickening within the paranasal sinuses. No sinus fluid level. Normal aeration of mastoid air cells. Orbits are unremarkable.  Orbits: No acute finding. Review of the MIP images confirms the above findings CTA NECK FINDINGS Aortic arch: Four-vessel variant branching. Imaged portion shows no evidence of aneurysm or dissection. No significant stenosis of the major arch vessel origins. Right carotid system: No evidence of dissection, stenosis (50% or greater) or occlusion. Mild non stenotic calcific atherosclerosis of carotid bifurcation. Left carotid system: No evidence of dissection, stenosis (50% or greater) or occlusion. Vertebral arteries: Codominant. No evidence of dissection, stenosis (50% or greater) or occlusion. Skeleton: Mild spondylosis of the cervical spine. No high-grade bony spinal canal stenosis. No acute osseous abnormality is evident Other neck: Negative. Upper chest: Negative. Review of the MIP images confirms the above findings CTA HEAD FINDINGS Anterior circulation: No significant stenosis, proximal occlusion, aneurysm, or vascular malformation. Posterior circulation: No significant stenosis, proximal occlusion, aneurysm, or vascular malformation. Venous sinuses: As permitted by contrast timing, patent. Anatomic variants: None significant. Delayed phase: No abnormal intracranial enhancement. Review of the MIP images confirms the above findings IMPRESSION: CT head: 1. Large hematoma centered within the left basal ganglia measuring up to 8.8 cm, 110 cc. Associated edema and mass effect effaces the left lateral ventricle and results in 6 mm of left-to-right midline shift. 2. Trace volume of hemorrhage within the occipital horn of right lateral ventricle. Right lateral ventriculomegaly may be due to ex vacuo dilatation from brain parenchymal volume loss  and chronic right inferolateral frontal lobe infarction or possibly early entrapment, attention at follow-up recommended. CTA neck: 1. Patent carotid and vertebral arteries. No dissection, aneurysm, or hemodynamically significant stenosis utilizing NASCET criteria. 2. Mild  spondylosis of the cervical spine. CTA head: 1. Patent anterior and posterior intracranial circulation. No large vessel occlusion, aneurysm, vascular malformation, or significant stenosis identified. These results were called by telephone at the time of interpretation on 07/17/2018 at 7:50 pm to Dr. Milon Dikes , who verbally acknowledged these results. Electronically Signed   By: Mitzi Hansen M.D.   On: 07/17/2018 20:16   Ct Head Code Stroke Wo Contrast  Result Date: 07/17/2018 CLINICAL DATA:  58 y/o M; right-sided weakness, left-sided gaze, nonverbal, code stroke. EXAM: CT HEAD WITHOUT CONTRAST CT ANGIOGRAPHY HEAD AND NECK TECHNIQUE: CT of the head was performed from skull base to vertex without intravenous contrast administration. Multidetector CT imaging of the head and neck was performed using the standard protocol during bolus administration of intravenous contrast. Multiplanar CT image reconstructions and MIPs were obtained to evaluate the vascular anatomy. Carotid stenosis measurements (when applicable) are obtained utilizing NASCET criteria, using the distal internal carotid diameter as the denominator. CONTRAST:  4mL ISOVUE-370 IOPAMIDOL (ISOVUE-370) INJECTION 76% COMPARISON:  None. FINDINGS: CT HEAD FINDINGS Brain: Large brain parenchymal hemorrhage centered within left basal ganglia measuring 8.8 x 4.5 x 5.2 cm (volume = 110 cm^3)(AP x ML x CC series 3, image 20 and series 5, image 43). Trace volume of hemorrhage is present within the occipital horn of right lateral ventricle. Edema and mass effect associated with the hematoma effaces the left lateral ventricle and results in 6 mm of left-to-right midline shift. The right lateral ventricle is mildly enlarged which may be due to parenchymal volume loss and ex vacuo dilatation from chronic infarction or possibly early ventricular entrapment, attention at follow-up recommended. There is a small chronic infarction within the right  inferolateral frontal lobe. There are moderate chronic microvascular ischemic changes of white matter and volume loss of the brain. Vascular: As below. Skull: Normal. Negative for fracture or focal lesion. Sinuses: Mild mucosal thickening within the paranasal sinuses. No sinus fluid level. Normal aeration of mastoid air cells. Orbits are unremarkable. Orbits: No acute finding. Review of the MIP images confirms the above findings CTA NECK FINDINGS Aortic arch: Four-vessel variant branching. Imaged portion shows no evidence of aneurysm or dissection. No significant stenosis of the major arch vessel origins. Right carotid system: No evidence of dissection, stenosis (50% or greater) or occlusion. Mild non stenotic calcific atherosclerosis of carotid bifurcation. Left carotid system: No evidence of dissection, stenosis (50% or greater) or occlusion. Vertebral arteries: Codominant. No evidence of dissection, stenosis (50% or greater) or occlusion. Skeleton: Mild spondylosis of the cervical spine. No high-grade bony spinal canal stenosis. No acute osseous abnormality is evident Other neck: Negative. Upper chest: Negative. Review of the MIP images confirms the above findings CTA HEAD FINDINGS Anterior circulation: No significant stenosis, proximal occlusion, aneurysm, or vascular malformation. Posterior circulation: No significant stenosis, proximal occlusion, aneurysm, or vascular malformation. Venous sinuses: As permitted by contrast timing, patent. Anatomic variants: None significant. Delayed phase: No abnormal intracranial enhancement. Review of the MIP images confirms the above findings IMPRESSION: CT head: 1. Large hematoma centered within the left basal ganglia measuring up to 8.8 cm, 110 cc. Associated edema and mass effect effaces the left lateral ventricle and results in 6 mm of left-to-right midline shift. 2. Trace volume of hemorrhage within the occipital horn  of right lateral ventricle. Right lateral  ventriculomegaly may be due to ex vacuo dilatation from brain parenchymal volume loss and chronic right inferolateral frontal lobe infarction or possibly early entrapment, attention at follow-up recommended. CTA neck: 1. Patent carotid and vertebral arteries. No dissection, aneurysm, or hemodynamically significant stenosis utilizing NASCET criteria. 2. Mild spondylosis of the cervical spine. CTA head: 1. Patent anterior and posterior intracranial circulation. No large vessel occlusion, aneurysm, vascular malformation, or significant stenosis identified. These results were called by telephone at the time of interpretation on 07/17/2018 at 7:50 pm to Dr. Milon Dikes , who verbally acknowledged these results. Electronically Signed   By: Mitzi Hansen M.D.   On: 07/17/2018 20:16    PHYSICAL EXAM Obese middle-aged African-American male not in distress. . Afebrile. Head is nontraumatic. Neck is supple without bruit.    Cardiac exam no murmur or gallop. Lungs are clear to auscultation. Distal pulses are well felt.   Neurological Exam :  Awake alert globally aphasic and unresponsive. Follows only occasional midline and simple commands. Able to follow gaze in all directions but does have left gaze preference. Able to look to the right past midline. Blinks to threat on the left but not on the right. Right lower facial weakness. Tongue midline. Motor system exam shows dense right hemiplegia with withdrawal in the right lower extremity and none in the right upper extremity. Has spontaneous antigravity movements on the left side. Decreased response to pain. Now on the right compared to the left. Gait not tested ASSESSMENT/PLAN Mr. Jahni Nazar is a 58 y.o. male with history seizures, hypertension and hyperlipidemia presenting with right-sided weakness, leftward gaze.   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 ICH etiology unclear, BP not really high on admission.  Given pt so awake with a large hemorrhage, concerned for possible underlying mass lesion with hemorrhage  Code Stroke CT head large L BG ICH w/ edema and mass effect L lat ventricle w/ 6mm L to R shift. trace ICH R lateral ventricle. R lat ventriculometry.   CTA head patent  CTA neck patent. Mild spinal spondylosis  F/u CT slight interval contraction large L BG ICH 110cc->106cc. IVH slightly increased. Similar mass effect w/ 8mm L to R shift w/ increased dilatation R temp horn R lat ventricle ? trapping  MRI w/w/o pending   2D Echo  pending   LDL ordered  HgbA1c oredered SCDs for VTE prophylaxis NPO. For  MBSS this afternoon. Diet per SLP  No antithrombotic prior to admission, now on No antithrombotic given hemorrhage  Therapy recommendations:  pending   Disposition:  pending   Cerebral Edema Induced Hypernatremia  On 3% @ 75 via PIV  Na 142  Goal Na 150-155  Check Na q6h  Continue for now.   Consider PICC if ongoing treatment needed  Hypertension  BP max 162/100  Now maxed out on Cleviprex  SBP goal < 140  Hyperlipidemia  Home meds:  None listed at this time  LDL pending, goal < 70  Hold statin at present given ICH  Seizures  On Keppra PTA  EEG pending   Other Stroke Risk Factors  Obesity, There is no height or weight on file to calculate BMI., recommend weight loss, diet and exercise as appropriate   Other Active Problems  Thrombocytopenia PLT 131  Limited hx available. He has brother, who is in HD this am. RN to get info from him when he visits next.  Hospital day # 1  [name], MSN, APRN, ANVP-BC, AGPCNP-BC Advanced Practice Stroke Nurse [facility] See Amion for Schedule & Pager information 07/18/2018 10:53 AM  I have personally obtained history,examined this patient, reviewed notes, independently viewed imaging studies, participated in medical decision making and plan of care.ROS completed by me personally and  pertinent positives fully documented  I have made any additions or clarifications directly to the above note. Agree with note above. He has presented with a large intracerebral hemorrhage but etiology remains indeterminate as his blood pressure has not been significantly elevated and his neurological exam looks a lot at 11) scan. Recommend strict control of hypertension with systolic blood pressure goal below 140 and close neurological monitoring. Continue hypertonic saline its serum sodium goal 150-155. Check MRI scan of the brain later today.Swallow eval by speech therapy. Patient may need elective intubation if he has neurological decline. No family available at the bedside for discussion. Discussed with Dr. Everardo All critical care medicine. This patient is critically ill and at significant risk of neurological worsening, death and care requires constant monitoring of vital signs, hemodynamics,respiratory and cardiac monitoring, extensive review of multiple databases, frequent neurological assessment, discussion with family, other specialists and medical decision making of high complexity.I have made any additions or clarifications directly to the above note.This critical care time does not reflect procedure time, or teaching time or supervisory time of PA/NP/Med Resident etc but could involve care discussion time.  I spent 30 minutes of neurocritical care time  in the care of  this patient.      Delia Heady, MD Medical Director [facility] Pager: [phone] 07/18/2018 4:27 PM  To contact Stroke Continuity provider, please refer to [email or web address]. After hours, contact General Neurology

## 2018-07-18 NOTE — Progress Notes (Signed)
NIHSS completed. Score higher than previous shift. Provider aware. Will cont to monitor.

## 2018-07-18 NOTE — Progress Notes (Signed)
OT Cancellation Note  Patient Details Name: Jamie Stewart MRN: [id number] DOB: 08/21/1955   Cancelled Treatment:    Reason Eval/Treat Not Completed: Patient at procedure or test/ unavailable. Pt at multiple tests today and getting ready to go to MRI.   Ignacia Palma, OTR/L Acute Rehab Services Pager [phone] Office [phone]     Evette Georges 07/18/2018, 2:31 PM

## 2018-07-18 NOTE — Evaluation (Signed)
Speech Language Pathology Evaluation Patient Details Name: Jamie Stewart MRN: [id number] DOB: 08/21/1955 Today's Date: 07/18/2018 Time: 4076-8088 SLP Time Calculation (min) (ACUTE ONLY): 12 min  Problem List:  Patient Active Problem List   Diagnosis Date Noted  . ICH (intracerebral hemorrhage) (HCC) 07/17/2018   Past Medical History: History reviewed. No pertinent past medical history. Past Surgical History: History reviewed. No pertinent surgical history. HPI:  Jamie Stewart is a 58 y.o. male with no documented history, based on his medications bottles, history of seizures and hypertension hyperlipidemia, presented to the emergency with unresponsiveness and left gaze. Per chart he is completely aphasic unable to provide any history. CT large hematoma centered within the left basal ganglia measuring up to 8.8 cm, 110 cc. Associated edema and mass effect effaces the left lateral ventricle, trace volume of hemorrhage within the occipital horn of right lateral ventricle. Right lateral ventriculomegaly may be due to ex vacuo dilatation from brain parenchymal volume loss and chronic right inferolateral frontal lobe infarction or possibly early entrapment, attention at follow-up recommended. CXR No acute cardiopulmonary abnormality seen.   Assessment / Plan / Recommendation Clinical Impression  Pt's comprehension and expression are significantly impaired marked by 100% impairment to simple biographical and environmental questions. Followed one of five one step commands. Presents with motor speech and significant deficits in verbal expression. Vocal intensity is low and verbalizations comprised primarily by neologisms without pt's awareness. When asked to write his name he wrote " ANGIA". Max cues for naming with phrase completion and phonemic cues unsuccessful for common objects. He exhibits right inattention but when asked to see in right visual field he can bring eyes to midline (slightly past?).  Cognitively did not note ink point not ejected during writing attempts. ST will need intervention to improve language and cognition to decrease assist pt will need in the future.      SLP Assessment  SLP Recommendation/Assessment: Patient needs continued Speech Lanaguage Pathology Services SLP Visit Diagnosis: Dysphagia, oropharyngeal phase (R13.12)    Follow Up Recommendations  Inpatient Rehab    Frequency and Duration min 2x/week  2 weeks      SLP Evaluation Cognition  Overall Cognitive Status: Impaired/Different from baseline Arousal/Alertness: Awake/alert(intermittent drowsiness) Orientation Level: Disoriented to place;Disoriented to time;Disoriented to situation;Disoriented to person(per y/n questions) Attention: Sustained Sustained Attention: Impaired Sustained Attention Impairment: Functional basic Memory: (TBA) Awareness: Impaired Awareness Impairment: Emergent impairment;Anticipatory impairment Problem Solving: Impaired Problem Solving Impairment: Functional basic Safety/Judgment: Impaired       Comprehension  Auditory Comprehension Overall Auditory Comprehension: Impaired Yes/No Questions: Impaired Basic Biographical Questions: 0-25% accurate Basic Immediate Environment Questions: 0-24% accurate Commands: Impaired One Step Basic Commands: 0-24% accurate Conversation: Simple Interfering Components: Processing speed;Attention EffectiveTechniques: Extra processing time Visual Recognition/Discrimination Discrimination: Not tested Reading Comprehension Reading Status: (TBA)    Expression Expression Primary Mode of Expression: Verbal Verbal Expression Overall Verbal Expression: Impaired Initiation: (intermittently) Level of Generative/Spontaneous Verbalization: Phrase Repetition: Impaired Level of Impairment: Word level Naming: Impairment Responsive: 0-25% accurate Confrontation: Impaired Verbal Errors: Neologisms;Not aware of errors Pragmatics:  Impairment Impairments: Eye contact Written Expression Dominant Hand: Left Written Expression: (TBA)   Oral / Motor  Oral Motor/Sensory Function Overall Oral Motor/Sensory Function: Moderate impairment Facial ROM: Reduced right;Suspected CN VII (facial) dysfunction Facial Symmetry: Abnormal symmetry right;Suspected CN VII (facial) dysfunction Facial Strength: Reduced right;Suspected CN VII (facial) dysfunction Facial Sensation: (pt states none but unreliable) Lingual ROM: Reduced right;Suspected CN XII (hypoglossal) dysfunction Lingual Symmetry: Within Functional Limits Lingual Strength: Reduced;Suspected CN XII (hypoglossal)  dysfunction Motor Speech Overall Motor Speech: Impaired Respiration: Within functional limits Phonation: Low vocal intensity Resonance: Within functional limits Articulation: Impaired Level of Impairment: Word Intelligibility: Intelligibility reduced Word: 25-49% accurate Phrase: 25-49% accurate Motor Planning: Impaired Level of Impairment: Word Motor Speech Errors: Unaware   GO                    Jamie Stewart 07/18/2018, 10:36 AM  Jamie Stewart Face.Ed Nurse, children's [phone] Office [phone]

## 2018-07-18 NOTE — Progress Notes (Signed)
  Echocardiogram 2D Echocardiogram has been performed.  Leta Jungling M 07/18/2018, 11:56 AM

## 2018-07-18 NOTE — Progress Notes (Signed)
EEG completed, results pending. 

## 2018-07-18 NOTE — Procedures (Signed)
History: 58yo M with ICH, being evaluated for seizures  Sedation: None  Technique: This is a 21 channel routine scalp EEG performed at the bedside with bipolar and monopolar montages arranged in accordance to the international 10/20 system of electrode placement. One channel was dedicated to EKG recording.    Background: The background consists of intermixed alpha and beta activities. There is a well defined posterior dominant rhythm of 8 Hz. In addition,there is polymorphic delta activity in the left hemisphere, maximal over the left temporal region . Sleep is recorded with normal appearing structur  Photic stimulation: Physiologic driving is not performed  EEG Abnormalities: 1) Left hemispheric irregular slow activity  Clinical Interpretation: This EEG is consistent with a focal left hemispheric cerebral dysfunction,consistent with the patient's known ICH.    There was no seizure or seizure predisposition recorded on this study. Please note that lack of epileptiform activity on EEG does not preclude the possibility of epilepsy.   Ritta Slot, MD [facility] [phone]  If 7pm- 7am, please page neurology on call as listed in [facility].

## 2018-07-18 NOTE — Progress Notes (Addendum)
Modified Barium Swallow Progress Note  Patient Details  Name: Jamie Stewart MRN: [id number] Date of Birth: 08/21/1955  Today's Date: 07/18/2018  Modified Barium Swallow completed.  Full report located under Chart Review in the Imaging Section.  Brief recommendations include the following:  Clinical Impression    Pt demonstrated moderate oral and mild pharyngeal dysphagia with laryngeal vestibule penetration. Incomplete labial closure led to anterior spill due to oral apraxia requirng tactile assist for labial approximation to cup. Self administerd sips facilitated oral acceptance with more natural oral phase. Lingual residue and piecemeal bolus transfer pattern intermittently present. Pt's motor system of pharyngeal swallow was functional but timing and coordination was decreased marked by protective mechanisms initiated at the level of the pyriform sinuses. Laryngeal penetration present with thin barium ranging from trace entrance to deeper into laryngeal vestibule (depending on volume) all of which consistently exited vestibule during the swallow. Given oral deficits, global aphasia, right neglect/inattention and cognitive impairments recommend nectar thick liquids (as precaution) to facilitate oral control, Dys 1 diet (puree), full supervision, intermittent volitional coughs and continued ST.    Swallow Evaluation Recommendations  Dys 1 (puree), nectar thick liquids, crush pills, FULL supervision/assist, NO straws, NO V-8 juice                                     Jamie Stewart 07/18/2018,3:52 PM   Jamie Stewart.Ed Nurse, children's [phone] Office [phone]

## 2018-07-18 NOTE — Evaluation (Signed)
Clinical/Bedside Swallow Evaluation Patient Details  Name: Jamie Stewart MRN: [id number] Date of Birth: 08/21/1955  Today's Date: 07/18/2018 Time: SLP Start Time (ACUTE ONLY): 6270 SLP Stop Time (ACUTE ONLY): 0951 SLP Time Calculation (min) (ACUTE ONLY): 10 min  Past Medical History: History reviewed. No pertinent past medical history. Past Surgical History: History reviewed. No pertinent surgical history. HPI:  Jamie Stewart is a 58 y.o. male with no documented history, based on his medications bottles, history of seizures and hypertension hyperlipidemia, presented to the emergency with unresponsiveness and left gaze. Per chart he is completely aphasic unable to provide any history. CT large hematoma centered within the left basal ganglia measuring up to 8.8 cm, 110 cc. Associated edema and mass effect effaces the left lateral ventricle, trace volume of hemorrhage within the occipital horn of right lateral ventricle. Right lateral ventriculomegaly may be due to ex vacuo dilatation from brain parenchymal volume loss and chronic right inferolateral frontal lobe infarction or possibly early entrapment, attention at follow-up recommended. CXR No acute cardiopulmonary abnormality seen.   Assessment / Plan / Recommendation Clinical Impression  Pt presents with CN VII impairment with decreased facial ROM during oral motor exam and inability to fully contain bolus with right sided spill with water. Tongue unable to touch right corner or mouth. Vocal intensity is low with weak volitional coughl (+) swallow on command with additional time and repetition. Appeared to have decreased control during oropharyngeal transit or decreased laryngeal closure due to  coughing after 4-5 sips of 3 oz water intake; multiple swallows with puree. Pharyngeal dysphagia likely from observations and recommend NPO until MBS scheduled at 1400 today.      SLP Visit Diagnosis: Dysphagia, oropharyngeal phase (R13.12)    Aspiration  Risk  Moderate aspiration risk    Diet Recommendation NPO        Other  Recommendations     Follow up Recommendations        Frequency and Duration            Prognosis        Swallow Study   General HPI: Jamie Stewart is a 58 y.o. male with no documented history, based on his medications bottles, history of seizures and hypertension hyperlipidemia, presented to the emergency with unresponsiveness and left gaze. Per chart he is completely aphasic unable to provide any history. CT large hematoma centered within the left basal ganglia measuring up to 8.8 cm, 110 cc. Associated edema and mass effect effaces the left lateral ventricle, trace volume of hemorrhage within the occipital horn of right lateral ventricle. Right lateral ventriculomegaly may be due to ex vacuo dilatation from brain parenchymal volume loss and chronic right inferolateral frontal lobe infarction or possibly early entrapment, attention at follow-up recommended. CXR No acute cardiopulmonary abnormality seen. Type of Study: Bedside Swallow Evaluation Previous Swallow Assessment: (none) Diet Prior to this Study: NPO Temperature Spikes Noted: No Respiratory Status: Room air History of Recent Intubation: No Behavior/Cognition: Pleasant mood;Alert;Cooperative(intermittently drowsy) Oral Cavity Assessment: Other (comment)(unable to view entire oral cavity) Oral Care Completed by SLP: Recent completion by staff Oral Cavity - Dentition: Adequate natural dentition Vision: Impaired for self-feeding Self-Feeding Abilities: Able to feed self;Other (Comment)(udsing left hand) Patient Positioning: Upright in bed Baseline Vocal Quality: Low vocal intensity Volitional Cough: Weak Volitional Swallow: Able to elicit    Oral/Motor/Sensory Function Overall Oral Motor/Sensory Function: Moderate impairment Facial ROM: Reduced right;Suspected CN VII (facial) dysfunction Facial Symmetry: Abnormal symmetry right;Suspected CN VII  (facial) dysfunction Facial Strength: Reduced  right;Suspected CN VII (facial) dysfunction Facial Sensation: (pt states none but unreliable) Lingual ROM: Reduced right;Suspected CN XII (hypoglossal) dysfunction Lingual Symmetry: Within Functional Limits Lingual Strength: Reduced;Suspected CN XII (hypoglossal) dysfunction   Ice Chips Ice chips: Not tested   Thin Liquid Thin Liquid: Impaired Oral Phase Impairments: Reduced labial seal Oral Phase Functional Implications: Right anterior spillage Pharyngeal  Phase Impairments: Multiple swallows;Cough - Delayed    Nectar Thick Nectar Thick Liquid: Not tested   Honey Thick Honey Thick Liquid: Not tested   Puree Puree: Impaired Pharyngeal Phase Impairments: Multiple swallows   Solid     Solid: Not tested      Royce Macadamia 07/18/2018,10:11 AM  Breck Coons Lonell Face.Ed Nurse, children's [phone] Office [phone]

## 2018-07-19 DIAGNOSIS — G935 Compression of brain: Secondary | ICD-10-CM

## 2018-07-19 LAB — URINALYSIS, ROUTINE W REFLEX MICROSCOPIC
Bilirubin Urine: NEGATIVE
Glucose, UA: 50 mg/dL — AB
Hgb urine dipstick: NEGATIVE
KETONES UR: NEGATIVE mg/dL
Leukocytes,Ua: NEGATIVE
Nitrite: NEGATIVE
Protein, ur: NEGATIVE mg/dL
Specific Gravity, Urine: 1.012 (ref 1.005–1.030)
pH: 7 (ref 5.0–8.0)

## 2018-07-19 LAB — LIPID PANEL
Cholesterol: 266 mg/dL — ABNORMAL HIGH (ref 0–200)
HDL: 80 mg/dL (ref >40)
LDL Cholesterol: 118 mg/dL — ABNORMAL HIGH (ref 0–99)
Total CHOL/HDL Ratio: 3.3 RATIO
Triglycerides: 340 mg/dL — ABNORMAL HIGH (ref <150)
VLDL: 68 mg/dL — ABNORMAL HIGH (ref 0–40)

## 2018-07-19 LAB — CBC
HCT: 40.6 % (ref 39.0–52.0)
Hemoglobin: 13.2 g/dL (ref 13.0–17.0)
MCH: 32 pg (ref 26.0–34.0)
MCHC: 32.5 g/dL (ref 30.0–36.0)
MCV: 98.3 fL (ref 80.0–100.0)
PLATELETS: 130 10*3/uL — AB (ref 150–400)
RBC: 4.13 MIL/uL — AB (ref 4.22–5.81)
RDW: 16.5 % — ABNORMAL HIGH (ref 11.5–15.5)
WBC: 5.6 10*3/uL (ref 4.0–10.5)
nRBC: 0 % (ref 0.0–0.2)

## 2018-07-19 LAB — SODIUM
Sodium: 144 mmol/L (ref 135–145)
Sodium: 145 mmol/L (ref 135–145)
Sodium: 144 mmol/L (ref 135–145)
Sodium: 144 mmol/L (ref 135–145)

## 2018-07-19 LAB — HEMOGLOBIN A1C
Hgb A1c MFr Bld: 6.4 % — ABNORMAL HIGH (ref 4.8–5.6)
Mean Plasma Glucose: 136.98 mg/dL

## 2018-07-19 MED ORDER — SODIUM CHLORIDE 3 % IV SOLN
INTRAVENOUS | Status: DC
  Administered 2018-07-19: 37.5 mL/h via INTRAVENOUS
  Filled 2018-07-19 (×3): qty 500

## 2018-07-19 MED ORDER — SODIUM CHLORIDE 3 % IV SOLN
INTRAVENOUS | Status: DC
  Filled 2018-07-19: qty 500

## 2018-07-19 MED ORDER — LABETALOL HCL 5 MG/ML IV SOLN
20.0000 mg | INTRAVENOUS | Status: DC | PRN
  Filled 2018-07-19: qty 4

## 2018-07-19 NOTE — Progress Notes (Signed)
  Speech Language Pathology Treatment: Dysphagia;Cognitive-Linquistic  Patient Details Name: Jamie Stewart MRN: [id number] DOB: 08/21/1955 Today's Date: 07/19/2018 Time: 8887-5797 SLP Time Calculation (min) (ACUTE ONLY): 20 min  Assessment / Plan / Recommendation Clinical Impression  Pt seen for skilled ST treatment targeting dysphagia, aphasia and apraxia. Pt sitting upright in chair, answers biographical questions in form of Y/N with 80% accuracy. SLP facilitated expression/choice-making with pt gesturing to preferred beverage from 2 choices. Pt's responses to open-ended questions primarily fluent jargon. When 3 friends arrived, pt able to approximate their names with imitation cues.   SLP assessed pt tolerance of dysphagia 1 (puree) and nectar-thick liquid textures; per chart review pt has been febrile, lung sounds clear. He did not exhibit any overt signs of aspiration with POs today, although usual mod A required (verbal, tactile cues) initially for compensations, primarily slow rate, small bites/sips. Pt clears throat with imitation cues. As trials progressed, SLP able to fade cues to occasional min-mod A for slower rate. Continue with dys 1, nectar thick liquids, meds crushed. Full supervision due to level of cuing necessary for compensatory strategies. Will continue to follow.     HPI HPI: Jamie Stewart is a 58 y.o. male with no documented history, based on his medications bottles, history of seizures and hypertension hyperlipidemia, presented to the emergency with unresponsiveness and left gaze. Per chart he is completely aphasic unable to provide any history. CT large hematoma centered within the left basal ganglia measuring up to 8.8 cm, 110 cc. Associated edema and mass effect effaces the left lateral ventricle, trace volume of hemorrhage within the occipital horn of right lateral ventricle. Right lateral ventriculomegaly may be due to ex vacuo dilatation from brain parenchymal volume loss  and chronic right inferolateral frontal lobe infarction or possibly early entrapment, attention at follow-up recommended. CXR No acute cardiopulmonary abnormality seen.      SLP Plan  Continue with current plan of care       Recommendations  Diet recommendations: Dysphagia 1 (puree);Nectar-thick liquid Liquids provided via: Cup Medication Administration: Crushed with puree Supervision: Full supervision/cueing for compensatory strategies;Patient able to self feed Compensations: Minimize environmental distractions;Slow rate;Small sips/bites;Clear throat intermittently                Oral Care Recommendations: Oral care BID Follow up Recommendations: Inpatient Rehab SLP Visit Diagnosis: Dysphagia, oropharyngeal phase (R13.12);Aphasia (R47.01);Apraxia (R48.2) Plan: Continue with current plan of care       GO              Rondel Baton, MS, CCC-SLP Speech-Language Pathologist Acute Rehabilitation Services Pager: [phone] Office: [phone]   Arlana Lindau 07/19/2018, 10:59 AM

## 2018-07-19 NOTE — Progress Notes (Signed)
Rehab Admissions Coordinator Note:  Patient was screened by Clois Dupes for appropriateness for an Inpatient Acute Rehab Consult per PT recs.   At this time, we are recommending Inpatient Rehab consult.  Clois Dupes 07/19/2018, 1:23 PM  I can be reached at [phone].

## 2018-07-19 NOTE — Evaluation (Signed)
Physical Therapy Evaluation Patient Details Name: Jamie Stewart MRN: [id number] DOB: 08/21/1955 Today's Date: 07/19/2018   History of Present Illness  Pt is a 58 y/o male with no documented PMH, who presented to the ED with unresponsiveness and left gaze. CT revealed a large hematoma centered within the left basal ganglia measuring up to 8.8 cm, 110 cc. Associated edema and mass effect effaces the left lateral ventricle, trace volume of hemorrhage within the occipital horn of right lateral ventricle.  Clinical Impression  Pt admitted with above diagnosis. Pt currently with functional limitations due to the deficits listed below (see PT Problem List). At the time of PT eval pt was able to perform transfers with up to +2 max assist for balance support and safety. Explained PT POC and recommendation for CIR at end of session. Pt shaking his head "yes" in agreement. Pt will benefit from skilled PT to increase their independence and safety with mobility to allow discharge to the venue listed below.       Follow Up Recommendations CIR;Supervision/Assistance - 24 hour    Equipment Recommendations  Other (comment)(TBD by next venue of care)    Recommendations for Other Services Rehab consult     Precautions / Restrictions Precautions Precautions: Fall Restrictions Weight Bearing Restrictions: No      Mobility  Bed Mobility Overal bed mobility: Needs Assistance Bed Mobility: Rolling;Sidelying to Sit Rolling: Mod assist;+2 for physical assistance Sidelying to sit: Mod assist;+2 for physical assistance;HOB elevated       General bed mobility comments: Increased time and cues for pt to turn head R and look to railing. Hand over hand assist to reach for rail for support. +2 assist required for pt to elevate trunk to full sitting position. Noted strong R lateral lean but pt able to help hold himself midline with LUE on foot board.   Transfers Overall transfer level: Needs  assistance Equipment used: 2 person hand held assist Transfers: Sit to/from [employer] Sit to Stand: Mod assist;+2 physical assistance Stand pivot transfers: Max assist;+2 physical assistance       General transfer comment: R knee blocked throughout. Pt initially stood with heavy +2 mod assist, however with attempt to pivot to chair, transfer became +2 max due to R knee buckle and R lateral lean.   Ambulation/Gait             General Gait Details: Unable to progress gait training at this time.   Stairs            Wheelchair Mobility    Modified Rankin (Stroke Patients Only) Modified Rankin (Stroke Patients Only) Pre-Morbid Rankin Score: No symptoms Modified Rankin: Severe disability     Balance Overall balance assessment: Needs assistance Sitting-balance support: Feet supported;Single extremity supported Sitting balance-Leahy Scale: Zero Sitting balance - Comments: Max assist at times Postural control: Posterior lean;Right lateral lean Standing balance support: Single extremity supported Standing balance-Leahy Scale: Zero Standing balance comment: +2 required                             Pertinent Vitals/Pain Pain Assessment: Faces Faces Pain Scale: No hurt Pain Intervention(s): Monitored during session    Home Living Family/patient expects to be discharged to:: Inpatient rehab                 Additional Comments: Lives in an apartment with a roommate (not ground floor, but not sure if he's on the second or  third floor). Pt shakes head "no" when asked if he has other family/friends around to help, but shakes head "yes" when asked if roommate could assist him at home.     Prior Function Level of Independence: Independent         Comments: Pt states he was working [employer] but not able to tell me what he did for work     Hand Dominance   Dominant Hand: Left    Extremity/Trunk Assessment   Upper Extremity  Assessment Upper Extremity Assessment: RUE deficits/detail RUE Deficits / Details: flaccid RUE Coordination: decreased fine motor;decreased gross motor    Lower Extremity Assessment Lower Extremity Assessment: LLE deficits/detail LLE Deficits / Details: Decreased strength and AROM consistent with above mentioned diagnosis LLE Coordination: decreased fine motor;decreased gross motor    Cervical / Trunk Assessment Cervical / Trunk Assessment: Normal  Communication   Communication: Expressive difficulties  Cognition Arousal/Alertness: Awake/alert Behavior During Therapy: Flat affect Overall Cognitive Status: Impaired/Different from baseline Area of Impairment: Orientation                 Orientation Level: Disoriented to;Situation;Time             General Comments: Pt attempting to tell me his name - says "Jamie Stewart" only - visibly frustrated that he is not able to tell me his last name. Asked if he likes to be called Jamie Stewart and if that is what we should call him, and he states "yes". At end of session shakes head "no" when asking if he knew why he was in the hospital. Reoriented to situation.       General Comments      Exercises     Assessment/Plan    PT Assessment Patient needs continued PT services  PT Problem List Decreased strength;Decreased range of motion;Decreased activity tolerance;Decreased balance;Decreased mobility;Decreased coordination;Decreased cognition;Decreased knowledge of use of DME;Decreased safety awareness;Decreased knowledge of precautions;Impaired tone       PT Treatment Interventions DME instruction;Gait training;Stair training;Functional mobility training;Therapeutic activities;Therapeutic exercise;Neuromuscular re-education;Patient/family education    PT Goals (Current goals can be found in the Care Plan section)  Acute Rehab PT Goals Patient Stated Goal: Pt did not state goals during session PT Goal Formulation: Patient unable to  participate in goal setting Time For Goal Achievement: 08/02/18 Potential to Achieve Goals: Good    Frequency Min 4X/week   Barriers to discharge        Co-evaluation               AM-PAC PT "6 Clicks" Mobility  Outcome Measure Help needed turning from your back to your side while in a flat bed without using bedrails?: A Lot Help needed moving from lying on your back to sitting on the side of a flat bed without using bedrails?: A Lot Help needed moving to and from a bed to a chair (including a wheelchair)?: Total Help needed standing up from a chair using your arms (e.g., wheelchair or bedside chair)?: Total Help needed to walk in hospital room?: Total Help needed climbing 3-5 steps with a railing? : Total 6 Click Score: 8    End of Session Equipment Utilized During Treatment: Gait belt Activity Tolerance: Patient tolerated treatment well Patient left: in chair;with call bell/phone within reach;with chair alarm set Nurse Communication: Mobility status PT Visit Diagnosis: Hemiplegia and hemiparesis;Other symptoms and signs involving the nervous system (R29.898) Hemiplegia - Right/Left: Right Hemiplegia - caused by: Nontraumatic intracerebral hemorrhage    Time: [id number] PT Time Calculation (  min) (ACUTE ONLY): 20 min   Charges:   PT Evaluation $PT Eval Moderate Complexity: 1 Mod          Conni Slipper, PT, DPT Acute Rehabilitation Services Pager: [phone] Office: [phone]   Marylynn Pearson 07/19/2018, 1:13 PM

## 2018-07-19 NOTE — Progress Notes (Signed)
STROKE TEAM PROGRESS NOTE   INTERVAL HISTORY His RN is at the bedside.  He looks better than his scan. Awake. Follows some commands. EOMI. MRI scan confirms large basal ganglia hemorrhage without any underlying structural or vascular lesion. He remains neurologically unchanged blood pressure is adequately controlled. 7 sodium is 145  Vitals:   07/19/18 0800 07/19/18 0900 07/19/18 1000 07/19/18 1200  BP: (!) 138/92 (!) 134/99 (!) 151/123   Pulse: (!) 106 (!) 105 (!) 110   Resp: 17 18 20    Temp: 98.2 F (36.8 C)   99.9 F (37.7 C)  TempSrc: Oral   Oral  SpO2: 93% 92% 91%     CBC:  Recent Labs  Lab 07/17/18 1926  WBC 5.6  NEUTROABS 4.2  HGB 12.8*  HCT 38.1*  MCV 98.4  PLT 131*    Basic Metabolic Panel:  Recent Labs  Lab 07/17/18 1926 07/17/18 1933  07/19/18 0422 07/19/18 0740  NA 145  --    < > 144 145  K 3.7  --   --   --   --   CL 103  --   --   --   --   CO2 19*  --   --   --   --   GLUCOSE 119*  --   --   --   --   BUN 7  --   --   --   --   CREATININE 0.83 0.60*  --   --   --   CALCIUM 9.1  --   --   --   --    < > = values in this interval not displayed.   Lipid Panel:     Component Value Date/Time   CHOL 266 (H) 07/19/2018 0422   TRIG 340 (H) 07/19/2018 0422   HDL 80 07/19/2018 0422   CHOLHDL 3.3 07/19/2018 0422   VLDL 68 (H) 07/19/2018 0422   LDLCALC 118 (H) 07/19/2018 0422   HgbA1c:  Lab Results  Component Value Date   HGBA1C 6.4 (H) 07/19/2018   Urine Drug Screen: No results found for: LABOPIA, COCAINSCRNUR, LABBENZ, AMPHETMU, THCU, LABBARB  Alcohol Level     Component Value Date/Time   ETH 24 (H) 07/17/2018 1926    IMAGING Ct Angio Head W Or Wo Contrast  Result Date: 07/17/2018 CLINICAL DATA:  58 y/o M; right-sided weakness, left-sided gaze, nonverbal, code stroke. EXAM: CT HEAD WITHOUT CONTRAST CT ANGIOGRAPHY HEAD AND NECK TECHNIQUE: CT of the head was performed from skull base to vertex without intravenous contrast administration.  Multidetector CT imaging of the head and neck was performed using the standard protocol during bolus administration of intravenous contrast. Multiplanar CT image reconstructions and MIPs were obtained to evaluate the vascular anatomy. Carotid stenosis measurements (when applicable) are obtained utilizing NASCET criteria, using the distal internal carotid diameter as the denominator. CONTRAST:  89mL ISOVUE-370 IOPAMIDOL (ISOVUE-370) INJECTION 76% COMPARISON:  None. FINDINGS: CT HEAD FINDINGS Brain: Large brain parenchymal hemorrhage centered within left basal ganglia measuring 8.8 x 4.5 x 5.2 cm (volume = 110 cm^3)(AP x ML x CC series 3, image 20 and series 5, image 43). Trace volume of hemorrhage is present within the occipital horn of right lateral ventricle. Edema and mass effect associated with the hematoma effaces the left lateral ventricle and results in 6 mm of left-to-right midline shift. The right lateral ventricle is mildly enlarged which may be due to parenchymal volume loss and ex vacuo dilatation from chronic  infarction or possibly early ventricular entrapment, attention at follow-up recommended. 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 There are moderate chronic microvascular ischemic changes of white matter and volume loss of the brain. Vascular: As below. Skull: Normal. Negative for fracture or focal lesion. Sinuses: Mild mucosal thickening within the paranasal sinuses. No sinus fluid level. Normal aeration of mastoid air cells. Orbits are unremarkable. Orbits: No acute finding. Review of the MIP images confirms the above findings CTA NECK FINDINGS Aortic arch: Four-vessel variant branching. Imaged portion shows no evidence of aneurysm or dissection. No significant stenosis of the major arch vessel origins. Right carotid system: No evidence of dissection, stenosis (50% or greater) or occlusion. Mild non stenotic calcific atherosclerosis of carotid bifurcation.  Left carotid system: No evidence of dissection, stenosis (50% or greater) or occlusion. Vertebral arteries: Codominant. No evidence of dissection, stenosis (50% or greater) or occlusion. Skeleton: Mild spondylosis of the cervical spine. No high-grade bony spinal canal stenosis. No acute osseous abnormality is evident Other neck: Negative. Upper chest: Negative. Review of the MIP images confirms the above findings CTA HEAD FINDINGS Anterior circulation: No significant stenosis, proximal occlusion, aneurysm, or vascular malformation. Posterior circulation: No significant stenosis, proximal occlusion, aneurysm, or vascular malformation. Venous sinuses: As permitted by contrast timing, patent. Anatomic variants: None significant. Delayed phase: No abnormal intracranial enhancement. Review of the MIP images confirms the above findings IMPRESSION: CT head: 1. Large hematoma centered within the left basal ganglia measuring up to 8.8 cm, 110 cc. Associated edema and mass effect effaces the left lateral ventricle and results in 6 mm of left-to-right midline shift. 2. Trace volume of hemorrhage within the occipital horn of right lateral ventricle. Right lateral ventriculomegaly may be due to ex vacuo dilatation from brain parenchymal volume loss and chronic right inferolateral frontal lobe infarction or possibly early entrapment, attention at follow-up recommended. CTA neck: 1. Patent carotid and vertebral arteries. No dissection, aneurysm, or hemodynamically significant stenosis utilizing NASCET criteria. 2. Mild spondylosis of the cervical spine. CTA head: 1. Patent anterior and posterior intracranial circulation. No large vessel occlusion, aneurysm, vascular malformation, or significant stenosis identified. These results were called by telephone at the time of interpretation on 07/17/2018 at 7:50 pm to Dr. Milon Dikes , who verbally acknowledged these results. Electronically Signed   By: Mitzi Hansen M.D.   On:  07/17/2018 20:16   Dg Chest 1 View  Result Date: 07/17/2018 CLINICAL DATA:  Aspiration pneumonia. EXAM: CHEST  1 VIEW COMPARISON:  None. FINDINGS: Mild cardiomegaly is noted. Both lungs are clear. The visualized skeletal structures are unremarkable. IMPRESSION: No acute cardiopulmonary abnormality seen. Electronically Signed   By: Lupita Raider, M.D.   On: 07/17/2018 20:59   Ct Head Wo Contrast  Result Date: 07/18/2018 CLINICAL DATA:  Intraparenchymal hemorrhage EXAM: CT HEAD WITHOUT CONTRAST TECHNIQUE: Contiguous axial images were obtained from the base of the skull through the vertex without intravenous contrast. COMPARISON:  None. FINDINGS: Brain: Large LEFT cerebral hemorrhage centered in the basal ganglia and deep white matter of the frontal lobe measured at same orientation at 8.2 x 4.3 by 5.3 cm (volume = 98 cm^3)compared to 8.9 by 4.6 x 5.2 cm (volume = 110 cm^3). Visually large hematoma is very similar. Peripheral vasogenic edema similar. Persistent LEFT to RIGHT midline shift measuring 6 mm (image 13/13) compared to 5 mm remeasured. There is compression of the LEFT lateral ventricle. Mild prominence of the atria of the LEFT ventricle similar prior.  Similar appearance to the RIGHT ventricle. Small amount of intra ventricular blood layers in the atria of the RIGHT ventricle. Basilar cisterns are patent. Vascular: No hyperdense vessel or unexpected calcification. Skull: Normal. Negative for fracture or focal lesion. Sinuses/Orbits: No acute finding. Other: None IMPRESSION: 1. No increase in volume of large acute intraparenchymal LEFT cerebral hemorrhage with potential mild contraction. 2. No significant change in mass effect with mild (6mm) LEFT to RIGHT midline shift and compression of the LEFT lateral ventricle. 3. No change in small volume intraventricular hemorrhage. 4. Basal cisterns are patent. Electronically Signed   By: Genevive Bi M.D.   On: 07/18/2018 12:50   Ct Head Wo  Contrast  Result Date: 07/18/2018 CLINICAL DATA:  Follow-up examination for intracranial hemorrhage. EXAM: CT HEAD WITHOUT CONTRAST TECHNIQUE: Contiguous axial images were obtained from the base of the skull through the vertex without intravenous contrast. COMPARISON:  Prior CT from 07/17/2018. FINDINGS: Brain: Large intraparenchymal hematoma centered at the left basal ganglia is perhaps slightly contracted from previous measuring approximately 106 cc, previously 110 cc). Surrounding vasogenic edema with regional mass effect relatively similar. Adjacent left lateral ventricle largely effaced with up to 8 mm of left-to-right midline shift. Evidence for intraventricular extension with blood in the lateral ventricles as well as the cerebral aqueduct, slightly increased from previous. Slight asymmetric dilatation of the temporal horn of the right lateral ventricle has slightly worsened, possibly reflecting developing ventricular trapping. No other new intracranial hemorrhage. No extra-axial fluid collection. No acute large vessel territory infarct. Vascular: No new hyperdense vessel. Calcified atherosclerosis at the skull base. Skull: Scalp soft tissues and calvarium demonstrate no acute finding. Sinuses/Orbits: Globes and orbital soft tissues within normal limits. Mild mucosal thickening within the paranasal sinuses. Mastoid air cells remain clear. Other: None. IMPRESSION: 1. Slight interval contraction of large hematoma centered at the left basal ganglia, now measuring 106 cc, previously 110 cc. Associated intraventricular extension with small volume intraventricular blood, slightly increased from previous. 2. Similar regional mass effect with up to 8 mm left-to-right shift. Slightly worsened dilatation of the temporal horn of the right lateral ventricle, which could reflect developing ventricular trapping. Attention at follow-up. 3. No other new acute intracranial abnormality. Electronically Signed   By: Rise Mu M.D.   On: 07/18/2018 03:09   Ct Angio Neck W Or Wo Contrast  Result Date: 07/17/2018 CLINICAL DATA:  58 y/o M; right-sided weakness, left-sided gaze, nonverbal, code stroke. EXAM: CT HEAD WITHOUT CONTRAST CT ANGIOGRAPHY HEAD AND NECK TECHNIQUE: CT of the head was performed from skull base to vertex without intravenous contrast administration. Multidetector CT imaging of the head and neck was performed using the standard protocol during bolus administration of intravenous contrast. Multiplanar CT image reconstructions and MIPs were obtained to evaluate the vascular anatomy. Carotid stenosis measurements (when applicable) are obtained utilizing NASCET criteria, using the distal internal carotid diameter as the denominator. CONTRAST:  75mL ISOVUE-370 IOPAMIDOL (ISOVUE-370) INJECTION 76% COMPARISON:  None. FINDINGS: CT HEAD FINDINGS Brain: Large brain parenchymal hemorrhage centered within left basal ganglia measuring 8.8 x 4.5 x 5.2 cm (volume = 110 cm^3)(AP x ML x CC series 3, image 20 and series 5, image 43). Trace volume of hemorrhage is present within the occipital horn of right lateral ventricle. Edema and mass effect associated with the hematoma effaces the left lateral ventricle and results in 6 mm of left-to-right midline shift. The right lateral ventricle is mildly enlarged which may be due to parenchymal volume loss and ex  vacuo dilatation from chronic infarction or possibly early ventricular entrapment, attention at follow-up recommended. 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 There are moderate chronic microvascular ischemic changes of white matter and volume loss of the brain. Vascular: As below. Skull: Normal. Negative for fracture or focal lesion. Sinuses: Mild mucosal thickening within the paranasal sinuses. No sinus fluid level. Normal aeration of mastoid air cells. Orbits are unremarkable. Orbits: No acute finding. Review of the MIP images  confirms the above findings CTA NECK FINDINGS Aortic arch: Four-vessel variant branching. Imaged portion shows no evidence of aneurysm or dissection. No significant stenosis of the major arch vessel origins. Right carotid system: No evidence of dissection, stenosis (50% or greater) or occlusion. Mild non stenotic calcific atherosclerosis of carotid bifurcation. Left carotid system: No evidence of dissection, stenosis (50% or greater) or occlusion. Vertebral arteries: Codominant. No evidence of dissection, stenosis (50% or greater) or occlusion. Skeleton: Mild spondylosis of the cervical spine. No high-grade bony spinal canal stenosis. No acute osseous abnormality is evident Other neck: Negative. Upper chest: Negative. Review of the MIP images confirms the above findings CTA HEAD FINDINGS Anterior circulation: No significant stenosis, proximal occlusion, aneurysm, or vascular malformation. Posterior circulation: No significant stenosis, proximal occlusion, aneurysm, or vascular malformation. Venous sinuses: As permitted by contrast timing, patent. Anatomic variants: None significant. Delayed phase: No abnormal intracranial enhancement. Review of the MIP images confirms the above findings IMPRESSION: CT head: 1. Large hematoma centered within the left basal ganglia measuring up to 8.8 cm, 110 cc. Associated edema and mass effect effaces the left lateral ventricle and results in 6 mm of left-to-right midline shift. 2. Trace volume of hemorrhage within the occipital horn of right lateral ventricle. Right lateral ventriculomegaly may be due to ex vacuo dilatation from brain parenchymal volume loss and chronic right inferolateral frontal lobe infarction or possibly early entrapment, attention at follow-up recommended. CTA neck: 1. Patent carotid and vertebral arteries. No dissection, aneurysm, or hemodynamically significant stenosis utilizing NASCET criteria. 2. Mild spondylosis of the cervical spine. CTA head: 1. Patent  anterior and posterior intracranial circulation. No large vessel occlusion, aneurysm, vascular malformation, or significant stenosis identified. These results were called by telephone at the time of interpretation on 07/17/2018 at 7:50 pm to Dr. Milon Dikes , who verbally acknowledged these results. Electronically Signed   By: Mitzi Hansen M.D.   On: 07/17/2018 20:16   Mr Laqueta Jean BJ Contrast  Result Date: 07/18/2018 CLINICAL DATA:  Intracranial hemorrhage.  Altered mental status. EXAM: MRI HEAD WITHOUT AND WITH CONTRAST TECHNIQUE: Multiplanar, multiecho pulse sequences of the brain and surrounding structures were obtained without and with intravenous contrast. CONTRAST:  9 mL Gadavist COMPARISON:  Head CT 07/18/2018 FINDINGS: BRAIN: Massive intraparenchymal hematoma centered in the left basal ganglia is unchanged in size. There is intraventricular extension into both lateral ventricles. Rightward midline shift measures 5 mm, unchanged. The size and configuration of the ventricles are unchanged. There is edema extending into the subcortical white matter of the left frontal and parietal lobes. Early confluent hyperintense T2-weighted signal of the periventricular and deep white matter, most commonly due to chronic ischemic microangiopathy. No abnormal contrast enhancement. VASCULAR: Major intracranial arterial and venous sinus flow voids are normal. SKULL AND UPPER CERVICAL SPINE: Calvarial bone marrow signal is normal. There is no skull base mass. Visualized upper cervical spine and soft tissues are normal. SINUSES/ORBITS: No fluid levels or advanced mucosal thickening. No mastoid or middle ear effusion. The orbits  are normal. IMPRESSION: 1. Unchanged size of large intraparenchymal hematoma centered in the left basal ganglia, most consistent with hypertensive hemorrhage. 2. No abnormal contrast enhancement or other evidence of underlying lesion. 3. Unchanged 5 mm rightward midline shift with  intraventricular extension of hemorrhage. Electronically Signed   By: Deatra Robinson M.D.   On: 07/18/2018 16:21   Dg Swallowing Func-speech Pathology  Result Date: 07/18/2018 Objective Swallowing Evaluation: Type of Study: MBS-Modified Barium Swallow Study  Patient Details Name: Jamie Stewart MRN: [id number] Date of Birth: 08/21/1955 Today's Date: 07/18/2018 Time: SLP Start Time (ACUTE ONLY): 4098 -SLP Stop Time (ACUTE ONLY): 0951 SLP Time Calculation (min) (ACUTE ONLY): 12 min Past Medical History: No past medical history on file. Past Surgical History: No past surgical history on file. HPI: Jamie Stewart is a 58 y.o. male with no documented history, based on his medications bottles, history of seizures and hypertension hyperlipidemia, presented to the emergency with unresponsiveness and left gaze. Per chart he is completely aphasic unable to provide any history. CT large hematoma centered within the left basal ganglia measuring up to 8.8 cm, 110 cc. Associated edema and mass effect effaces the left lateral ventricle, trace volume of hemorrhage within the occipital horn of right lateral ventricle. Right lateral ventriculomegaly may be due to ex vacuo dilatation from brain parenchymal volume loss and chronic right inferolateral frontal lobe infarction or possibly early entrapment, attention at follow-up recommended. CXR No acute cardiopulmonary abnormality seen.  No data recorded Assessment / Plan / Recommendation CHL IP CLINICAL IMPRESSIONS 07/18/2018 Clinical Impression Pt demonstrated moderate oral and mild pharyngeal dysphagia with laryngeal vestibule penetration. Incomplete labial closure led to anterior spill due to oral apraxia requirng tactile assist for labial approximation to cup. Self administerd sips facilitated oral acceptance with more natural oral phase. Lingual residue and piecemeal bolus transfer pattern intermittently present. Pt's motor system of pharyngeal swallow was functional but timing and  coordination was decreased marked by protective mechanisms initiated at the level of the pyriform sinuses. Laryngeal penetration present with thin barium ranging from trace entrance to deeper into laryngeal vestibule (depending on volume) all of which consistently exited vestibule during the swallow. Given oral deficits, global aphasia, right neglect/inattention and cognitive impairments recommend nectar thick liquids (as precaution) to facilitate oral control, Dys 1 diet (puree), full supervision, intermittent volitional coughs and continued ST.  SLP Visit Diagnosis Dysphagia, oropharyngeal phase (R13.12) Attention and concentration deficit following -- Frontal lobe and executive function deficit following -- Impact on safety and function Moderate aspiration risk;Mild aspiration risk   CHL IP TREATMENT RECOMMENDATION 07/18/2018 Treatment Recommendations Therapy as outlined in treatment plan below   No flowsheet data found. CHL IP DIET RECOMMENDATION 07/18/2018 SLP Diet Recommendations Dysphagia 1 (Puree) solids;Nectar thick liquid Liquid Administration via Cup;No straw Medication Administration Crushed with puree Compensations Minimize environmental distractions;Slow rate;Small sips/bites;Clear throat intermittently Postural Changes Seated upright at 90 degrees   CHL IP OTHER RECOMMENDATIONS 07/18/2018 Recommended Consults -- Oral Care Recommendations Oral care BID Other Recommendations --   CHL IP FOLLOW UP RECOMMENDATIONS 07/18/2018 Follow up Recommendations Inpatient Rehab   CHL IP FREQUENCY AND DURATION 07/18/2018 Speech Therapy Frequency (ACUTE ONLY) min 2x/week Treatment Duration --      CHL IP ORAL PHASE 07/18/2018 Oral Phase Impaired Oral - Pudding Teaspoon -- Oral - Pudding Cup -- Oral - Honey Teaspoon Reduced posterior propulsion Oral - Honey Cup -- Oral - Nectar Teaspoon Piecemeal swallowing Oral - Nectar Cup Lingual/palatal residue Oral - Nectar Straw -- Oral -  Thin Teaspoon -- Oral - Thin Cup Left anterior  bolus loss Oral - Thin Straw -- Oral - Puree -- Oral - Mech Soft Other (Comment) Oral - Regular -- Oral - Multi-Consistency -- Oral - Pill -- Oral Phase - Comment --  CHL IP PHARYNGEAL PHASE 07/18/2018 Pharyngeal Phase Impaired Pharyngeal- Pudding Teaspoon -- Pharyngeal -- Pharyngeal- Pudding Cup -- Pharyngeal -- Pharyngeal- Honey Teaspoon Delayed swallow initiation-pyriform sinuses Pharyngeal -- Pharyngeal- Honey Cup WFL Pharyngeal -- Pharyngeal- Nectar Teaspoon WFL Pharyngeal -- Pharyngeal- Nectar Cup Delayed swallow initiation-pyriform sinuses Pharyngeal -- Pharyngeal- Nectar Straw -- Pharyngeal -- Pharyngeal- Thin Teaspoon Delayed swallow initiation-pyriform sinuses Pharyngeal -- Pharyngeal- Thin Cup Delayed swallow initiation-pyriform sinuses;Penetration/Aspiration during swallow Pharyngeal Material enters airway, remains ABOVE vocal cords then ejected out Pharyngeal- Thin Straw -- Pharyngeal -- Pharyngeal- Puree -- Pharyngeal -- Pharyngeal- Mechanical Soft WFL Pharyngeal -- Pharyngeal- Regular -- Pharyngeal -- Pharyngeal- Multi-consistency -- Pharyngeal -- Pharyngeal- Pill -- Pharyngeal -- Pharyngeal Comment --  CHL IP CERVICAL ESOPHAGEAL PHASE 07/18/2018 Cervical Esophageal Phase WFL Pudding Teaspoon -- Pudding Cup -- Honey Teaspoon -- Honey Cup -- Nectar Teaspoon -- Nectar Cup -- Nectar Straw -- Thin Teaspoon -- Thin Cup -- Thin Straw -- Puree -- Mechanical Soft -- Regular -- Multi-consistency -- Pill -- Cervical Esophageal Comment -- Jamie Stewart 07/18/2018, 3:51 PM Jamie Stewart M.Ed Sports administrator Pager [phone] Office [phone]              Ct Head Code Stroke Wo Contrast  Result Date: 07/17/2018 CLINICAL DATA:  58 y/o M; right-sided weakness, left-sided gaze, nonverbal, code stroke. EXAM: CT HEAD WITHOUT CONTRAST CT ANGIOGRAPHY HEAD AND NECK TECHNIQUE: CT of the head was performed from skull base to vertex without intravenous contrast administration. Multidetector CT  imaging of the head and neck was performed using the standard protocol during bolus administration of intravenous contrast. Multiplanar CT image reconstructions and MIPs were obtained to evaluate the vascular anatomy. Carotid stenosis measurements (when applicable) are obtained utilizing NASCET criteria, using the distal internal carotid diameter as the denominator. CONTRAST:  44mL ISOVUE-370 IOPAMIDOL (ISOVUE-370) INJECTION 76% COMPARISON:  None. FINDINGS: CT HEAD FINDINGS Brain: Large brain parenchymal hemorrhage centered within left basal ganglia measuring 8.8 x 4.5 x 5.2 cm (volume = 110 cm^3)(AP x ML x CC series 3, image 20 and series 5, image 43). Trace volume of hemorrhage is present within the occipital horn of right lateral ventricle. Edema and mass effect associated with the hematoma effaces the left lateral ventricle and results in 6 mm of left-to-right midline shift. The right lateral ventricle is mildly enlarged which may be due to parenchymal volume loss and ex vacuo dilatation from chronic infarction or possibly early ventricular entrapment, attention at follow-up recommended. 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 There are moderate chronic microvascular ischemic changes of white matter and volume loss of the brain. Vascular: As below. Skull: Normal. Negative for fracture or focal lesion. Sinuses: Mild mucosal thickening within the paranasal sinuses. No sinus fluid level. Normal aeration of mastoid air cells. Orbits are unremarkable. Orbits: No acute finding. Review of the MIP images confirms the above findings CTA NECK FINDINGS Aortic arch: Four-vessel variant branching. Imaged portion shows no evidence of aneurysm or dissection. No significant stenosis of the major arch vessel origins. Right carotid system: No evidence of dissection, stenosis (50% or greater) or occlusion. Mild non stenotic calcific atherosclerosis of carotid bifurcation. Left carotid  system: No evidence of dissection, stenosis (50% or  greater) or occlusion. Vertebral arteries: Codominant. No evidence of dissection, stenosis (50% or greater) or occlusion. Skeleton: Mild spondylosis of the cervical spine. No high-grade bony spinal canal stenosis. No acute osseous abnormality is evident Other neck: Negative. Upper chest: Negative. Review of the MIP images confirms the above findings CTA HEAD FINDINGS Anterior circulation: No significant stenosis, proximal occlusion, aneurysm, or vascular malformation. Posterior circulation: No significant stenosis, proximal occlusion, aneurysm, or vascular malformation. Venous sinuses: As permitted by contrast timing, patent. Anatomic variants: None significant. Delayed phase: No abnormal intracranial enhancement. Review of the MIP images confirms the above findings IMPRESSION: CT head: 1. Large hematoma centered within the left basal ganglia measuring up to 8.8 cm, 110 cc. Associated edema and mass effect effaces the left lateral ventricle and results in 6 mm of left-to-right midline shift. 2. Trace volume of hemorrhage within the occipital horn of right lateral ventricle. Right lateral ventriculomegaly may be due to ex vacuo dilatation from brain parenchymal volume loss and chronic right inferolateral frontal lobe infarction or possibly early entrapment, attention at follow-up recommended. CTA neck: 1. Patent carotid and vertebral arteries. No dissection, aneurysm, or hemodynamically significant stenosis utilizing NASCET criteria. 2. Mild spondylosis of the cervical spine. CTA head: 1. Patent anterior and posterior intracranial circulation. No large vessel occlusion, aneurysm, vascular malformation, or significant stenosis identified. These results were called by telephone at the time of interpretation on 07/17/2018 at 7:50 pm to Dr. Milon Dikes , who verbally acknowledged these results. Electronically Signed   By: Mitzi Hansen M.D.   On: 07/17/2018  20:16   EEG 07/18/18 : focal left hemispheric dysfunction consistent with his hemorrhage. No epileptiform activity  PHYSICAL EXAM Obese middle-aged African-American male not in distress. . Afebrile. Head is nontraumatic. Neck is supple without bruit.    Cardiac exam no murmur or gallop. Lungs are clear to auscultation. Distal pulses are well felt.   Neurological Exam :  Awake alert globally aphasic   Follows only occasional midline and simple commands. Able to follow gaze in all directions but does have left gaze preference. Able to look to the right past midline. Blinks to threat on the left but not on the right. Right lower facial weakness. Tongue midline. Motor system exam shows dense right hemiplegia with withdrawal in the right lower extremity and none in the right upper extremity. Has spontaneous antigravity movements on the left side. Decreased response to pain. Now on the right compared to the left. Gait not tested ASSESSMENT/PLAN [name] is a 58 y.o. male with history seizures, hypertension and hyperlipidemia presenting with right-sided weakness, leftward gaze.   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 ICH etiology unclear, BP not really high on admission. Given pt so awake with a large hemorrhage, concerned for possible underlying mass lesion with hemorrhage  Code Stroke CT head large L BG ICH w/ edema and mass effect L lat ventricle w/ 6mm L to R shift. trace ICH R lateral ventricle. R lat ventriculometry.   CTA head patent  CTA neck patent. Mild spinal spondylosis  F/u CT slight interval contraction large L BG ICH 110cc->106cc. IVH slightly increased. Similar mass effect w/ 8mm L to R shift w/ increased dilatation R temp horn R lat ventricle ? trapping  MRI w/w/o pending   2D Echo  pending   LDL 118 mg percent  HgbA1c 6.4 SCDs for VTE prophylaxis NPO. For  MBSS this afternoon. Diet per SLP  No antithrombotic prior  to  admission, now on No antithrombotic given hemorrhage  Therapy recommendations:  CLR  Disposition:  pending   Cerebral Edema Induced Hypernatremia  On 3% @ 75 via PIV  Na 142  Goal Na 150-155  Check Na q6h  Continue for now.   Consider PICC if ongoing treatment needed  Hypertension  BP max 162/100  Now maxed out on Cleviprex  SBP goal < 140  Hyperlipidemia  Home meds:  None listed at this time  LDL pending, goal < 70  Hold statin at present given ICH  Seizures  On Keppra PTA  EEG pending   Other Stroke Risk Factors  Obesity, There is no height or weight on file to calculate BMI., recommend weight loss, diet and exercise as appropriate   Other Active Problems  Thrombocytopenia PLT 131  Limited hx available. He has brother, who is in HD this am. RN to get info from him when he visits next.  Hospital day # 2    I have personally obtained history,examined this patient, reviewed notes, independently viewed imaging studies, participated in medical decision making and plan of care.ROS completed by me personally and pertinent positives fully documented  I have made any additions or clarifications directly to the above note. Agree with note above. He has presented with a large intracerebral hemorrhage but etiology remains indeterminate as his blood pressure has not been significantly elevated and his neurological exam looks a lot  better than his scans. Recommend strict control of hypertension with systolic blood pressure goal below 160 and close neurological monitoring. taper hypertonic saline over next 24 hours and discontinue.. possibly transfer to the floor tomorrow if stable No family available at the bedside for discussion.  This patient is critically ill and at significant risk of neurological worsening, death and care requires constant monitoring of vital signs, hemodynamics,respiratory and cardiac monitoring, extensive review of multiple databases, frequent  neurological assessment, discussion with family, other specialists and medical decision making of high complexity.I have made any additions or clarifications directly to the above note.This critical care time does not reflect procedure time, or teaching time or supervisory time of PA/NP/Med Resident etc but could involve care discussion time.  I spent 30 minutes of neurocritical care time  in the care of  this patient.      Delia HeadyPramod Anes Rigel, MD Medical Director [facility] Pager: [phone] 07/19/2018 12:51 PM  To contact Stroke Continuity provider, please refer to [email or web address]. After hours, contact General Neurology

## 2018-07-19 NOTE — Progress Notes (Signed)
Discussed need for central line with Dr. Pearlean Brownie as patient is receiving 3%. Will reassess tomorrow morning if 3% is still required.

## 2018-07-19 NOTE — Progress Notes (Signed)
Patient's brother states he does not know patient's home medication. Patient's brother also states that all of Jamie Stewart medications were taken with him to the hospital. Upon investigation I was not able to find patient's medication in the ED or [facility].

## 2018-07-19 NOTE — Progress Notes (Signed)
Dr. Pearlean Brownie wants to decrease hypertonic saline by 50% for 12 hours and then 50% again for 12 hours. Titration orders initiated.   Update: Hypertonic saline switched to new PIV to prevent vascular breakdown.

## 2018-07-20 DIAGNOSIS — I610 Nontraumatic intracerebral hemorrhage in hemisphere, subcortical: Principal | ICD-10-CM

## 2018-07-20 LAB — SODIUM
Sodium: 144 mmol/L (ref 135–145)
Sodium: 146 mmol/L — ABNORMAL HIGH (ref 135–145)
Sodium: 147 mmol/L — ABNORMAL HIGH (ref 135–145)

## 2018-07-20 MED ORDER — SULFAMETHOXAZOLE-TRIMETHOPRIM 800-160 MG PO TABS
1.0000 | ORAL_TABLET | Freq: Two times a day (BID) | ORAL | Status: AC
  Administered 2018-07-20 – 2018-07-24 (×10): 1 via ORAL
  Filled 2018-07-20 (×10): qty 1

## 2018-07-20 MED ORDER — SODIUM CHLORIDE 0.9 % IV SOLN
INTRAVENOUS | Status: DC | PRN
  Administered 2018-07-20: 250 mL via INTRAVENOUS

## 2018-07-20 NOTE — Plan of Care (Signed)
  Problem: Nutrition: Goal: Risk of aspiration will decrease Outcome: Progressing   Problem: Clinical Measurements: Goal: Cardiovascular complication will be avoided Outcome: Progressing   Problem: Safety: Goal: Ability to remain free from injury will improve Outcome: Progressing

## 2018-07-20 NOTE — Consult Note (Signed)
Physical Medicine and Rehabilitation Consult Reason for Consult:Right sided weakness Referring Physician: Sethi   HPI: Jamie Stewart is a 58 y.o. male with a history of seizures and hypertension who presented to the emergency room on 07/17/2018 minimally responsive with leftward gaze and left hemiparesis.  CT of the head revealed a large left basal ganglia hemorrhage with edema and mass-effect.  MRI confirmed findings and revealed no evidence of underlying lesion.  Blood pressure has been under reasonable control.  Patient has begun to mobilize with therapies.  Physical medicine and rehabilitation was asked to assess the patient for rehab potential.  Review of Systems  Unable to perform ROS: Mental acuity   History reviewed. No pertinent past medical history. History reviewed. No pertinent surgical history. No family history on file. Social History:  has no history on file for tobacco, alcohol, and drug. Allergies: Allergies not on file Medications Prior to Admission  Medication Sig Dispense Refill  . carvedilol (COREG) 25 MG tablet Take 25 mg by mouth 2 (two) times daily with a meal.    . irbesartan (AVAPRO) 300 MG tablet Take 300 mg by mouth daily.    . isosorbide mononitrate (IMDUR) 30 MG 24 hr tablet Take 30 mg by mouth daily.    Marland Kitchen. levETIRAcetam (KEPPRA) 500 MG tablet Take 500 mg by mouth 2 (two) times daily.    . simvastatin (ZOCOR) 20 MG tablet Take 20 mg by mouth daily.    . vitamin B-12 (CYANOCOBALAMIN) 500 MCG tablet Take 500 mcg by mouth daily.      Home: Home Living Family/patient expects to be discharged to:: Inpatient rehab Additional Comments: Lives in an apartment with a roommate (not ground floor, but not sure if he's on the second or third floor). Pt shakes head "no" when asked if he has other family/friends around to help, but shakes head "yes" when asked if roommate could assist him at home.   Functional History: Prior Function Level of Independence:  Independent Comments: Pt states he was working PTA but not able to tell me what he did for work Functional Status:  Mobility: Bed Mobility Overal bed mobility: Needs Assistance Bed Mobility: Rolling, Sidelying to Sit Rolling: Mod assist, +2 for physical assistance Sidelying to sit: Mod assist, +2 for physical assistance, HOB elevated General bed mobility comments: Increased time and cues for pt to turn head R and look to railing. Hand over hand assist to reach for rail for support. +2 assist required for pt to elevate trunk to full sitting position. Noted strong R lateral lean but pt able to help hold himself midline with LUE on foot board.  Transfers Overall transfer level: Needs assistance Equipment used: 2 person hand held assist Transfers: Sit to/from Stand, Stand Pivot Transfers Sit to Stand: Mod assist, +2 physical assistance Stand pivot transfers: Max assist, +2 physical assistance General transfer comment: R knee blocked throughout. Pt initially stood with heavy +2 mod assist, however with attempt to pivot to chair, transfer became +2 max due to R knee buckle and R lateral lean.  Ambulation/Gait General Gait Details: Unable to progress gait training at this time.     ADL:    Cognition: Cognition Overall Cognitive Status: Impaired/Different from baseline Arousal/Alertness: Awake/alert(intermittent drowsiness) Orientation Level: Other (comment), Oriented to person(UTA the rest, pt has expressive aphasia) Attention: Sustained Sustained Attention: Impaired Sustained Attention Impairment: Functional basic Memory: (TBA) Awareness: Impaired Awareness Impairment: Emergent impairment, Anticipatory impairment Problem Solving: Impaired Problem Solving Impairment: Functional basic Safety/Judgment:  Impaired Cognition Arousal/Alertness: Awake/alert Behavior During Therapy: Flat affect Overall Cognitive Status: Impaired/Different from baseline Area of Impairment:  Orientation Orientation Level: Disoriented to, Situation, Time General Comments: Pt attempting to tell me his name - says "Jamie Stewart" only - visibly frustrated that he is not able to tell me his last name. Asked if he likes to be called Jamie Stewart and if that is what we should call him, and he states "yes". At end of session shakes head "no" when asking if he knew why he was in the hospital. Reoriented to situation.   Blood pressure (!) 146/99, pulse 73, temperature 99.5 F (37.5 C), temperature source Axillary, resp. rate (!) 23, SpO2 97 %. Physical Exam  Constitutional: No distress.  HENT:  Head: Normocephalic.  Eyes: Pupils are equal, round, and reactive to light.  Neck: Normal range of motion.  Cardiovascular: Normal rate.  Respiratory: Effort normal.  GI: Soft.  Musculoskeletal:        General: No edema.  Neurological:  Patient is slightly lethargic but responds to basic commands.  Was able to tell me that his daughter is available to help after discharge.  Right upper extremity 0 out of 5 proximal to distal with early flexor tone.  Right lower extremity also 0 out of 5 with some extensor tone noted.  Sensation 1 out of 2 right arm and leg.  Facial sensation appears to be equal bilaterally.  Speech is quite dysarthric and of low volume.  Skin: Skin is warm. He is not diaphoretic.  Psychiatric:  Flat affect    Results for orders placed or performed during the hospital encounter of 07/17/18 (from the past 24 hour(s))  Sodium     Status: None   Collection Time: 07/19/18  1:45 PM  Result Value Ref Range   Sodium 144 135 - 145 mmol/L  CBC     Status: Abnormal   Collection Time: 07/19/18  1:45 PM  Result Value Ref Range   WBC 5.6 4.0 - 10.5 K/uL   RBC 4.13 (L) 4.22 - 5.81 MIL/uL   Hemoglobin 13.2 13.0 - 17.0 g/dL   HCT 16.1 09.6 - 04.5 %   MCV 98.3 80.0 - 100.0 fL   MCH 32.0 26.0 - 34.0 pg   MCHC 32.5 30.0 - 36.0 g/dL   RDW 40.9 (H) 81.1 - 91.4 %   Platelets 130 (L) 150 - 400 K/uL    nRBC 0.0 0.0 - 0.2 %  Sodium     Status: None   Collection Time: 07/19/18  9:48 PM  Result Value Ref Range   Sodium 144 135 - 145 mmol/L  Sodium     Status: None   Collection Time: 07/20/18  2:21 AM  Result Value Ref Range   Sodium 144 135 - 145 mmol/L  Sodium     Status: Abnormal   Collection Time: 07/20/18  8:07 AM  Result Value Ref Range   Sodium 146 (H) 135 - 145 mmol/L   Mr Brain W Wo Contrast  Result Date: 07/18/2018 CLINICAL DATA:  Intracranial hemorrhage.  Altered mental status. EXAM: MRI HEAD WITHOUT AND WITH CONTRAST TECHNIQUE: Multiplanar, multiecho pulse sequences of the brain and surrounding structures were obtained without and with intravenous contrast. CONTRAST:  9 mL Gadavist COMPARISON:  Head CT 07/18/2018 FINDINGS: BRAIN: Massive intraparenchymal hematoma centered in the left basal ganglia is unchanged in size. There is intraventricular extension into both lateral ventricles. Rightward midline shift measures 5 mm, unchanged. The size and configuration of the ventricles are  unchanged. There is edema extending into the subcortical white matter of the left frontal and parietal lobes. Early confluent hyperintense T2-weighted signal of the periventricular and deep white matter, most commonly due to chronic ischemic microangiopathy. No abnormal contrast enhancement. VASCULAR: Major intracranial arterial and venous sinus flow voids are normal. SKULL AND UPPER CERVICAL SPINE: Calvarial bone marrow signal is normal. There is no skull base mass. Visualized upper cervical spine and soft tissues are normal. SINUSES/ORBITS: No fluid levels or advanced mucosal thickening. No mastoid or middle ear effusion. The orbits are normal. IMPRESSION: 1. Unchanged size of large intraparenchymal hematoma centered in the left basal ganglia, most consistent with hypertensive hemorrhage. 2. No abnormal contrast enhancement or other evidence of underlying lesion. 3. Unchanged 5 mm rightward midline shift with  intraventricular extension of hemorrhage. Electronically Signed   By: Deatra Robinson M.D.   On: 07/18/2018 16:21   Dg Swallowing Func-speech Pathology  Result Date: 07/18/2018 Objective Swallowing Evaluation: Type of Study: MBS-Modified Barium Swallow Study  Patient Details Name: Jamie Stewart MRN: [id number] Date of Birth: 08/21/1955 Today's Date: 07/18/2018 Time: SLP Start Time (ACUTE ONLY): 4158 -SLP Stop Time (ACUTE ONLY): 0951 SLP Time Calculation (min) (ACUTE ONLY): 12 min Past Medical History: No past medical history on file. Past Surgical History: No past surgical history on file. HPI: Demitre Borts is a 58 y.o. male with no documented history, based on his medications bottles, history of seizures and hypertension hyperlipidemia, presented to the emergency with unresponsiveness and left gaze. Per chart he is completely aphasic unable to provide any history. CT large hematoma centered within the left basal ganglia measuring up to 8.8 cm, 110 cc. Associated edema and mass effect effaces the left lateral ventricle, trace volume of hemorrhage within the occipital horn of right lateral ventricle. Right lateral ventriculomegaly may be due to ex vacuo dilatation from brain parenchymal volume loss and chronic right inferolateral frontal lobe infarction or possibly early entrapment, attention at follow-up recommended. CXR No acute cardiopulmonary abnormality seen.  No data recorded Assessment / Plan / Recommendation CHL IP CLINICAL IMPRESSIONS 07/18/2018 Clinical Impression Pt demonstrated moderate oral and mild pharyngeal dysphagia with laryngeal vestibule penetration. Incomplete labial closure led to anterior spill due to oral apraxia requirng tactile assist for labial approximation to cup. Self administerd sips facilitated oral acceptance with more natural oral phase. Lingual residue and piecemeal bolus transfer pattern intermittently present. Pt's motor system of pharyngeal swallow was functional but timing and  coordination was decreased marked by protective mechanisms initiated at the level of the pyriform sinuses. Laryngeal penetration present with thin barium ranging from trace entrance to deeper into laryngeal vestibule (depending on volume) all of which consistently exited vestibule during the swallow. Given oral deficits, global aphasia, right neglect/inattention and cognitive impairments recommend nectar thick liquids (as precaution) to facilitate oral control, Dys 1 diet (puree), full supervision, intermittent volitional coughs and continued ST.  SLP Visit Diagnosis Dysphagia, oropharyngeal phase (R13.12) Attention and concentration deficit following -- Frontal lobe and executive function deficit following -- Impact on safety and function Moderate aspiration risk;Mild aspiration risk   CHL IP TREATMENT RECOMMENDATION 07/18/2018 Treatment Recommendations Therapy as outlined in treatment plan below   No flowsheet data found. CHL IP DIET RECOMMENDATION 07/18/2018 SLP Diet Recommendations Dysphagia 1 (Puree) solids;Nectar thick liquid Liquid Administration via Cup;No straw Medication Administration Crushed with puree Compensations Minimize environmental distractions;Slow rate;Small sips/bites;Clear throat intermittently Postural Changes Seated upright at 90 degrees   CHL IP OTHER RECOMMENDATIONS 07/18/2018 Recommended Consults -- Oral  Care Recommendations Oral care BID Other Recommendations --   CHL IP FOLLOW UP RECOMMENDATIONS 07/18/2018 Follow up Recommendations Inpatient Rehab   CHL IP FREQUENCY AND DURATION 07/18/2018 Speech Therapy Frequency (ACUTE ONLY) min 2x/week Treatment Duration --      CHL IP ORAL PHASE 07/18/2018 Oral Phase Impaired Oral - Pudding Teaspoon -- Oral - Pudding Cup -- Oral - Honey Teaspoon Reduced posterior propulsion Oral - Honey Cup -- Oral - Nectar Teaspoon Piecemeal swallowing Oral - Nectar Cup Lingual/palatal residue Oral - Nectar Straw -- Oral - Thin Teaspoon -- Oral - Thin Cup Left anterior  bolus loss Oral - Thin Straw -- Oral - Puree -- Oral - Mech Soft Other (Comment) Oral - Regular -- Oral - Multi-Consistency -- Oral - Pill -- Oral Phase - Comment --  CHL IP PHARYNGEAL PHASE 07/18/2018 Pharyngeal Phase Impaired Pharyngeal- Pudding Teaspoon -- Pharyngeal -- Pharyngeal- Pudding Cup -- Pharyngeal -- Pharyngeal- Honey Teaspoon Delayed swallow initiation-pyriform sinuses Pharyngeal -- Pharyngeal- Honey Cup WFL Pharyngeal -- Pharyngeal- Nectar Teaspoon WFL Pharyngeal -- Pharyngeal- Nectar Cup Delayed swallow initiation-pyriform sinuses Pharyngeal -- Pharyngeal- Nectar Straw -- Pharyngeal -- Pharyngeal- Thin Teaspoon Delayed swallow initiation-pyriform sinuses Pharyngeal -- Pharyngeal- Thin Cup Delayed swallow initiation-pyriform sinuses;Penetration/Aspiration during swallow Pharyngeal Material enters airway, remains ABOVE vocal cords then ejected out Pharyngeal- Thin Straw -- Pharyngeal -- Pharyngeal- Puree -- Pharyngeal -- Pharyngeal- Mechanical Soft WFL Pharyngeal -- Pharyngeal- Regular -- Pharyngeal -- Pharyngeal- Multi-consistency -- Pharyngeal -- Pharyngeal- Pill -- Pharyngeal -- Pharyngeal Comment --  CHL IP CERVICAL ESOPHAGEAL PHASE 07/18/2018 Cervical Esophageal Phase WFL Pudding Teaspoon -- Pudding Cup -- Honey Teaspoon -- Honey Cup -- Nectar Teaspoon -- Nectar Cup -- Nectar Straw -- Thin Teaspoon -- Thin Cup -- Thin Straw -- Puree -- Mechanical Soft -- Regular -- Multi-consistency -- Pill -- Cervical Esophageal Comment -- Royce Macadamia 07/18/2018, 3:51 PM Breck Coons Litaker M.Ed Sports administrator Pager [phone] Office [phone]               Assessment/Plan: Diagnosis: 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 Does the need for close, 24 hr/day medical supervision in concert with the patient's rehab needs make it unreasonable for this patient to be served in a less intensive setting? Yes 2. Co-Morbidities requiring supervision/potential  complications: Hypertension, seizure disorder 3. Due to bladder management, bowel management, safety, skin/wound care, disease management, medication administration, pain management and patient education, does the patient require 24 hr/day rehab nursing? Yes 4. Does the patient require coordinated care of a physician, rehab nurse, PT (1-2 hrs/day, 5 days/week), OT (1-2 hrs/day, 5 days/week) and SLP (1-2 hrs/day, 5 days/week) to address physical and functional deficits in the context of the above medical diagnosis(es)? Yes Addressing deficits in the following areas: balance, endurance, locomotion, strength, transferring, bowel/bladder control, bathing, dressing, feeding, grooming, toileting, cognition, speech, swallowing and psychosocial support 5. Can the patient actively participate in an intensive therapy program of at least 3 hrs of therapy per day at least 5 days per week? Yes 6. The potential for patient to make measurable gains while on inpatient rehab is excellent 7. Anticipated functional outcomes upon discharge from inpatient rehab are supervision and min assist  with PT, supervision and min assist with OT, supervision with SLP. 8. Estimated rehab length of stay to reach the above functional goals is: 18-25 days 9. Anticipated D/C setting: Home 10. Anticipated post D/C treatments: HH therapy 11. Overall Rehab/Functional Prognosis: excellent  RECOMMENDATIONS: This patient's condition is appropriate for continued  rehabilitative care in the following setting: CIR Patient has agreed to participate in recommended program. Yes Note that insurance prior authorization may be required for reimbursement for recommended care.  Comment: Rehab Admissions Coordinator to follow up.  Thanks,  Ranelle OysterZachary T. Swartz, MD, [place] DomFAAPMR  I have personally performed a face to face diagnostic evaluation of this patient. Additionally, I have examined pertinent labs and radiographic images. I have reviewed and concur  with the physician assistant's documentation above.    Ranelle OysterZachary T Swartz, MD 07/20/2018

## 2018-07-20 NOTE — Progress Notes (Signed)
STROKE TEAM PROGRESS NOTE   INTERVAL HISTORY His RN is at the bedside.  He head hypertonic saline drip tapered off. His neurological examination shows improvement Is now able to speak a few words and occasional short sentences and comprehension is somewhat better Vitals:   07/20/18 0800 07/20/18 0900 07/20/18 1000 07/20/18 1200  BP: 117/80 (!) 146/103 (!) 146/99   Pulse: 75 85 73   Resp: (!) 22 18 (!) 23   Temp: 99.5 F (37.5 C)   99.2 F (37.3 C)  TempSrc: Axillary   Oral  SpO2: 96% 96% 97%     CBC:  Recent Labs  Lab 07/17/18 1926 07/19/18 1345  WBC 5.6 5.6  NEUTROABS 4.2  --   HGB 12.8* 13.2  HCT 38.1* 40.6  MCV 98.4 98.3  PLT 131* 130*    Basic Metabolic Panel:  Recent Labs  Lab 07/17/18 1926 07/17/18 1933  07/20/18 0221 07/20/18 0807  NA 145  --    < > 144 146*  K 3.7  --   --   --   --   CL 103  --   --   --   --   CO2 19*  --   --   --   --   GLUCOSE 119*  --   --   --   --   BUN 7  --   --   --   --   CREATININE 0.83 0.60*  --   --   --   CALCIUM 9.1  --   --   --   --    < > = values in this interval not displayed.   Lipid Panel:     Component Value Date/Time   CHOL 266 (H) 07/19/2018 0422   TRIG 340 (H) 07/19/2018 0422   HDL 80 07/19/2018 0422   CHOLHDL 3.3 07/19/2018 0422   VLDL 68 (H) 07/19/2018 0422   LDLCALC 118 (H) 07/19/2018 0422   HgbA1c:  Lab Results  Component Value Date   HGBA1C 6.4 (H) 07/19/2018   Urine Drug Screen: No results found for: LABOPIA, COCAINSCRNUR, LABBENZ, AMPHETMU, THCU, LABBARB  Alcohol Level     Component Value Date/Time   ETH 24 (H) 07/17/2018 1926    IMAGING Mr Laqueta Jean Wo Contrast  Result Date: 07/18/2018 CLINICAL DATA:  Intracranial hemorrhage.  Altered mental status. EXAM: MRI HEAD WITHOUT AND WITH CONTRAST TECHNIQUE: Multiplanar, multiecho pulse sequences of the brain and surrounding structures were obtained without and with intravenous contrast. CONTRAST:  9 mL Gadavist COMPARISON:  Head CT 07/18/2018  FINDINGS: BRAIN: Massive intraparenchymal hematoma centered in the left basal ganglia is unchanged in size. There is intraventricular extension into both lateral ventricles. Rightward midline shift measures 5 mm, unchanged. The size and configuration of the ventricles are unchanged. There is edema extending into the subcortical white matter of the left frontal and parietal lobes. Early confluent hyperintense T2-weighted signal of the periventricular and deep white matter, most commonly due to chronic ischemic microangiopathy. No abnormal contrast enhancement. VASCULAR: Major intracranial arterial and venous sinus flow voids are normal. SKULL AND UPPER CERVICAL SPINE: Calvarial bone marrow signal is normal. There is no skull base mass. Visualized upper cervical spine and soft tissues are normal. SINUSES/ORBITS: No fluid levels or advanced mucosal thickening. No mastoid or middle ear effusion. The orbits are normal. IMPRESSION: 1. Unchanged size of large intraparenchymal hematoma centered in the left basal ganglia, most consistent with hypertensive hemorrhage. 2. No abnormal contrast enhancement  or other evidence of underlying lesion. 3. Unchanged 5 mm rightward midline shift with intraventricular extension of hemorrhage. Electronically Signed   By: Deatra RobinsonKevin  Herman M.D.   On: 07/18/2018 16:21   EEG 07/18/18 : focal left hemispheric dysfunction consistent with his hemorrhage. No epileptiform activity  PHYSICAL EXAM Obese middle-aged African-American male not in distress. . Afebrile. Head is nontraumatic. Neck is supple without bruit.    Cardiac exam no murmur or gallop. Lungs are clear to auscultation. Distal pulses are well felt.   Neurological Exam :  Awake alert expressive greater than receptive aphasia. Speaks a few words and occasional short sentences.  Follows one and a few two-step commands. Able to follow gaze in all directions but does have left gaze preference. Able to look to the right past midline.  Blinks to threat on the left but not on the right. Right lower facial weakness. Tongue midline. Motor system exam shows dense right hemiplegia with withdrawal in the right lower extremity and none in the right upper extremity. Has spontaneous antigravity movements on the left side. Decreased response to pain. Now on the right compared to the left. Gait not tested ASSESSMENT/PLAN Mr. Salvadore DomRichard Guadiana is a 58 y.o. male with history seizures, hypertension and hyperlipidemia presenting with right-sided weakness, leftward gaze.   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 ICH etiology unclear, BP not really high on admission. Given pt so awake with a large hemorrhage, concerned for possible underlying mass lesion with hemorrhage  Code Stroke CT head large L BG ICH w/ edema and mass effect L lat ventricle w/ 6mm L to R shift. trace ICH R lateral ventricle. R lat ventriculometry.   CTA head patent  CTA neck patent. Mild spinal spondylosis  F/u CT slight interval contraction large L BG ICH 110cc->106cc. IVH slightly increased. Similar mass effect w/ 8mm L to R shift w/ increased dilatation R temp horn R lat ventricle ? trapping  MRI w/w/o large left basal ganglia hemorrhage. No abnormal enhancing lesion or tumor  2D Echo  Normal ejection fraction 60-65 percent. No wall motion abnormalities.  LDL 118 mg percent  HgbA1c 6.4 SCDs for VTE prophylaxis NPO. For  MBSS this afternoon. Diet per SLP  No antithrombotic prior to admission, now on No antithrombotic given hemorrhage  Therapy recommendations:  CLR  Disposition:  pending   Cerebral Edema Induced Hypernatremia  On 3% @ 75 via PIV  Na 142  Goal Na 150-155  Check Na q6h  Continue for now.   Consider PICC if ongoing treatment needed  Hypertension  BP max 162/100  Now maxed out on Cleviprex  SBP goal < 140  Hyperlipidemia  Home meds:  None listed at this time  LDL 118 mg percent, goal  < 70  Hold statin at present given ICH  Seizures  On Keppra PTA  EEG focal left hemispheric dysfunction consistent with patient's known ICH. No seizures.  Other Stroke Risk Factors  Obesity, There is no height or weight on file to calculate BMI., recommend weight loss, diet and exercise as appropriate   Other Active Problems  Thrombocytopenia PLT 131  Limited hx available. He has brother, who is in HD this am. RN to get info from him when he visits next.  Hospital day # 3     . He has presented with a large intracerebral hemorrhage but etiology remains indeterminate as his blood pressure has not been significantly elevated and his neurological exam looks  a lot  better than his scans. Discontinued hypertonic saline protocol.Recommend strict control of hypertension with systolic blood pressure goal below 160 . transfer to the floor today  No family available at the bedside for discussion. Mobilize out of bed. Therapy and rehabilitation consults. This patient is critically ill and at significant risk of neurological worsening, death and care requires constant monitoring of vital signs, hemodynamics,respiratory and cardiac monitoring, extensive review of multiple databases, frequent neurological assessment, discussion with family, other specialists and medical decision making of high complexity.I have made any additions or clarifications directly to the above note.This critical care time does not reflect procedure time, or teaching time or supervisory time of PA/NP/Med Resident etc but could involve care discussion time.  I spent 30 minutes of neurocritical care time  in the care of  this patient.      Delia Heady, MD Medical Director [facility] Pager: [phone] 07/20/2018 2:24 PM  To contact Stroke Continuity provider, please refer to [email or web address]. After hours, contact General Neurology

## 2018-07-21 LAB — URINE CULTURE: Culture: >=100000 — AB

## 2018-07-21 MED ORDER — PANTOPRAZOLE SODIUM 40 MG PO PACK
40.0000 mg | PACK | Freq: Every day | ORAL | Status: DC
  Administered 2018-07-21 – 2018-07-28 (×8): 40 mg via ORAL
  Filled 2018-07-21 (×8): qty 20

## 2018-07-21 MED ORDER — LEVETIRACETAM 100 MG/ML PO SOLN
500.0000 mg | Freq: Two times a day (BID) | ORAL | Status: DC
  Administered 2018-07-21 – 2018-08-01 (×22): 500 mg via ORAL
  Filled 2018-07-21 (×22): qty 5

## 2018-07-21 MED ORDER — PANTOPRAZOLE SODIUM 40 MG PO TBEC: 40.0000 mg | DELAYED_RELEASE_TABLET | Freq: Every day | ORAL | Status: DC

## 2018-07-21 MED ORDER — LEVETIRACETAM 500 MG PO TABS: 500.0000 mg | ORAL_TABLET | Freq: Two times a day (BID) | ORAL | Status: DC

## 2018-07-21 NOTE — Evaluation (Signed)
Occupational Therapy Evaluation Patient Details Name: Jamie Stewart MRN: [id number] DOB: 08/21/1955 Today's Date: 07/21/2018    History of Present Illness Pt is a 58 y/o male with no documented PMH, who presented to the ED with unresponsiveness and left gaze. CT revealed a large hematoma centered within the left basal ganglia measuring up to 8.8 cm, 110 cc. Associated edema and mass effect effaces the left lateral ventricle, trace volume of hemorrhage within the occipital horn of right lateral ventricle.   Clinical Impression   Patient presenting with expressive aphasia, decreased I in self care, balance, functional mobility/transfers, strength, and safety awareness. Pt is flaccid in R UE and beginning to push with L UE during this session. +2 for standing from bed level, weight shifting, and stand pivot transfer with R knee blocked.  Patient's chart reports him being independent PTA. Patient currently functioning at mod - +2 assist. Patient will benefit from acute OT to increase overall independence in the areas of ADLs, functional mobility, and safety awareness in order to safely discharge to next venue of care.    Follow Up Recommendations  CIR    Equipment Recommendations  Other (comment)(defer to next venue of care)    Recommendations for Other Services       Precautions / Restrictions Precautions Precautions: Fall Restrictions Weight Bearing Restrictions: No      Mobility Bed Mobility Overal bed mobility: Needs Assistance Bed Mobility: Rolling;Sidelying to Sit Rolling: Mod assist Sidelying to sit: Mod assist;+2 for physical assistance       General bed mobility comments: Increased time and step by step cues to reach for rail, bring LEs off bed and elevate trunk. Noted strong Rt lateral lean but pt able to help hold himself midline with LUE on foot board.   Transfers Overall transfer level: Needs assistance Equipment used: 2 person hand held assist Transfers: Sit  to/from Stand Sit to Stand: Mod assist;+2 physical assistance Stand pivot transfers: Max assist;+2 physical assistance       General transfer comment: ASsist of 2 to power to standing with cues for hip extension and upright posture; right lateral lean in standing. Right knee partial buckling during weight shifting. Stood from [employer] x2. SPT to chair with Max A of 2 as pt sitting prematurely- with worsened right lateral lean and right knee buckling.    Balance Overall balance assessment: Needs assistance Sitting-balance support: Feet supported;Single extremity supported Sitting balance-Leahy Scale: Poor Sitting balance - Comments: Max assist at times or UE support for sitting balance. Worked on upright posture and activating spinal extensors. Pushing at times towards right.  Postural control: Posterior lean;Right lateral lean Standing balance support: During functional activity Standing balance-Leahy Scale: Zero Standing balance comment: +2 required for standing balance; weight shifting x6 in standing with assist for upright and right knee stability.           ADL either performed or assessed with clinical judgement   ADL Overall ADL's : Needs assistance/impaired Eating/Feeding: Cueing for safety;Cueing for sequencing;Moderate assistance;Maximal assistance   Grooming: Wash/dry hands;Wash/dry face;Oral care;Maximal assistance   Upper Body Bathing: Maximal assistance   Lower Body Bathing: Total assistance;+2 for physical assistance   Upper Body Dressing : Maximal assistance   Lower Body Dressing: Total assistance;+2 for physical assistance          Vision Baseline Vision/History: No visual deficits Patient Visual Report: No change from baseline Additional Comments: R inattention, R gaze            Pertinent Vitals/Pain  Pain Assessment: Faces Faces Pain Scale: No hurt     Hand Dominance Left   Extremity/Trunk Assessment Upper Extremity Assessment Upper Extremity  Assessment: RUE deficits/detail RUE Deficits / Details: flaccid RUE Sensation: decreased light touch;decreased proprioception RUE Coordination: decreased fine motor;decreased gross motor           Communication Communication Communication: Expressive difficulties   Cognition Arousal/Alertness: Awake/alert Behavior During Therapy: Flat affect Overall Cognitive Status: Impaired/Different from baseline Area of Impairment: Following commands;Problem solving;Safety/judgement          Following Commands: Follows one step commands with increased time;Follows one step commands inconsistently Safety/Judgement: Decreased awareness of safety;Decreased awareness of deficits   Problem Solving: Difficulty sequencing;Requires verbal cues;Requires tactile cues General Comments: Able to state his name "Jamie Stewart," able to nod yes to being February and to being in the hospital. Most words nonsensical. Following ~50% of simple 1 step commands.               Home Living Family/patient expects to be discharged to:: Inpatient rehab         Additional Comments: Lives in an apartment with a roommate (not ground floor, but not sure if he's on the second or third floor). Pt shakes head "no" when asked if he has other family/friends around to help, but shakes head "yes" when asked if roommate could assist him at home.       Prior Functioning/Environment Level of Independence: Independent        Comments: Pt states he was working PTA but not able to tell me what he did for work        OT Problem List: Decreased strength;Impaired balance (sitting and/or standing);Decreased cognition;Decreased knowledge of precautions;Impaired tone;Decreased range of motion;Impaired vision/perception;Decreased safety awareness;Decreased activity tolerance;Decreased coordination;Decreased knowledge of use of DME or AE;Impaired UE functional use;Impaired sensation      OT Treatment/Interventions: Self-care/ADL  training;Manual therapy;Therapeutic exercise;Patient/family education;Neuromuscular education;Balance training;Modalities;Visual/perceptual remediation/compensation;Energy conservation;Therapeutic activities;DME and/or AE instruction;Cognitive remediation/compensation    OT Goals(Current goals can be found in the care plan section) Acute Rehab OT Goals Patient Stated Goal: Pt did not state goals during session ADL Goals Pt Will Perform Grooming: with min assist Pt Will Perform Upper Body Bathing: with min assist Pt Will Perform Lower Body Bathing: with mod assist Pt Will Perform Upper Body Dressing: with min assist Pt Will Perform Lower Body Dressing: with mod assist Pt Will Transfer to Toilet: with mod assist Pt Will Perform Toileting - Clothing Manipulation and hygiene: with mod assist  OT Frequency: Min 2X/week   Barriers to D/C: Decreased caregiver support          Co-evaluation PT/OT/SLP Co-Evaluation/Treatment: Yes Reason for Co-Treatment: For patient/therapist safety;To address functional/ADL transfers;Necessary to address cognition/behavior during functional activity PT goals addressed during session: Mobility/safety with mobility OT goals addressed during session: ADL's and self-care      AM-PAC OT "6 Clicks" Daily Activity     Outcome Measure Help from another person eating meals?: A Lot Help from another person taking care of personal grooming?: A Lot Help from another person toileting, which includes using toliet, bedpan, or urinal?: Total Help from another person bathing (including washing, rinsing, drying)?: A Lot Help from another person to put on and taking off regular upper body clothing?: A Lot Help from another person to put on and taking off regular lower body clothing?: Total 6 Click Score: 10   End of Session Equipment Utilized During Treatment: Gait belt Nurse Communication: Mobility status;Precautions  Activity Tolerance: Patient  tolerated treatment  well Patient left: in chair;with call bell/phone within reach;with chair alarm set  OT Visit Diagnosis: Muscle weakness (generalized) (M62.81);Hemiplegia and hemiparesis Hemiplegia - Right/Left: Right Hemiplegia - dominant/non-dominant: Dominant Hemiplegia - caused by: Nontraumatic intracerebral hemorrhage                Time: 0927-0953 OT Time Calculation (min): 26 min Charges:  OT General Charges $OT Visit: 1 Visit OT Evaluation $OT Eval High Complexity: 1 High  Orlandus Borowski P, MS, OTR/L 07/21/2018, 10:48 AM

## 2018-07-21 NOTE — Progress Notes (Signed)
  Speech Language Pathology Treatment: Dysphagia;Cognitive-Linquistic  Patient Details Name: Jamie Stewart MRN: [id number] DOB: 08/21/1955 Today's Date: 07/21/2018 Time: 6579-0383 SLP Time Calculation (min) (ACUTE ONLY): 24 min  Assessment / Plan / Recommendation Clinical Impression  SWALLOWING OT reported concern for pocketing with AM meal.  On SLP arrival, pt was seated upright in chair with midday meal on table in front of him. Of note, there are instructions in diet orders for pt not to have V8 juice.  There was V8 juice on tray on SLP arrival.  Someone had attempted to thicken juice in can.  Unfortunately this is ineffective, and results in clumping of thickener without thickening liquid uniformly.  To use Thicken Up gel thickener, please place appropriate amount of thickener in the bottom of a cup, then pour liquid over thickener and stir.  Pt had made no independent effort to eat.  There was residue in oral cavity.  SLP completed suction setup and provided oral care prior to administration of POs.  SLP fed pt portion of midday meal. Pt tolerated puree and nectar thick liquid with no clinical s/s of aspiration.  Pt exhibited adequate oral clearance of solids with swallow precautions (small bites, alternate solids/liquids).  There was trace, diffuse oral residue.  Pt requires 1:1 assist with meals.  Diet recommendation/swallowing precautions sign placed in room.  COMMUNICATION Pt continues to present with expressive and receptive communication deficits.  Today pt was able to spontaneously say his first name. He followed commands with 60% accuracy independently and executed other command with SLP model only.  Pt answered yes/no questions at chance for biographical information, but did not seem to perseverate. Pt appeared to do slightly better for immediate context.  Paired yes/no questions may at times be beneficial to communication wants/needs (ex. "Would you like more to eat?" and "Are you  full?").   HPI HPI: Luman Basset is a 58 y.o. male with no documented history, based on his medications bottles, history of seizures and hypertension hyperlipidemia, presented to the emergency with unresponsiveness and left gaze. Per chart he is completely aphasic unable to provide any history. CT large hematoma centered within the left basal ganglia measuring up to 8.8 cm, 110 cc. Associated edema and mass effect effaces the left lateral ventricle, trace volume of hemorrhage within the occipital horn of right lateral ventricle. Right lateral ventriculomegaly may be due to ex vacuo dilatation from brain parenchymal volume loss and chronic right inferolateral frontal lobe infarction or possibly early entrapment, attention at follow-up recommended. CXR No acute cardiopulmonary abnormality seen. No new imaging since 2/14      SLP Plan  Continue with current plan of care       Recommendations  Diet recommendations: Dysphagia 1 (puree);Nectar-thick liquid Liquids provided via: Cup Medication Administration: Crushed with puree Supervision: Full supervision/cueing for compensatory strategies Compensations: Slow rate;Small sips/bites;Follow solids with liquid;Clear throat intermittently Postural Changes and/or Swallow Maneuvers: Seated upright 90 degrees                General recommendations: Rehab consult Oral Care Recommendations: Oral care BID Follow up Recommendations: Inpatient Rehab;Skilled Nursing facility SLP Visit Diagnosis: Dysphagia, oropharyngeal phase (R13.12) Plan: Continue with current plan of care       GO                Kerrie Pleasure, MA, CCC-SLP Acute Rehabilitation Services Office: [phone]; Pager (2/17): [phone] 07/21/2018, 1:44 PM

## 2018-07-21 NOTE — Progress Notes (Signed)
IP rehab admissions - I met with patient but he cannot communicate with me due to aphasia.  He does have a brother and I will plan to speak with his brother later today about potential acute inpatient rehab admission.  Call me for questions.  [phone]

## 2018-07-21 NOTE — Progress Notes (Signed)
STROKE TEAM PROGRESS NOTE   INTERVAL HISTORY His RN is at the bedside.   Marland Kitchen His neurological examination continues to show improvement Is now able to speak a few words and occasional short sentences and comprehension is somewhat better Vitals:   07/21/18 0028 07/21/18 0351 07/21/18 0802 07/21/18 1126  BP: (!) 142/103 (!) 137/94 (!) 142/97 (!) 134/102  Pulse: (!) 102 84 89 91  Resp: 18 18 20    Temp: 98.4 F (36.9 C) 98.6 F (37 C) 99.5 F (37.5 C) 99.2 F (37.3 C)  TempSrc: Oral Oral Oral Oral  SpO2: 99% 94% 94% 96%  Weight:        CBC:  Recent Labs  Lab 07/17/18 1926 07/19/18 1345  WBC 5.6 5.6  NEUTROABS 4.2  --   HGB 12.8* 13.2  HCT 38.1* 40.6  MCV 98.4 98.3  PLT 131* 130*    Basic Metabolic Panel:  Recent Labs  Lab 07/17/18 1926 07/17/18 1933  07/20/18 0807 07/20/18 1406  NA 145  --    < > 146* 147*  K 3.7  --   --   --   --   CL 103  --   --   --   --   CO2 19*  --   --   --   --   GLUCOSE 119*  --   --   --   --   BUN 7  --   --   --   --   CREATININE 0.83 0.60*  --   --   --   CALCIUM 9.1  --   --   --   --    < > = values in this interval not displayed.   Lipid Panel:     Component Value Date/Time   CHOL 266 (H) 07/19/2018 0422   TRIG 340 (H) 07/19/2018 0422   HDL 80 07/19/2018 0422   CHOLHDL 3.3 07/19/2018 0422   VLDL 68 (H) 07/19/2018 0422   LDLCALC 118 (H) 07/19/2018 0422   HgbA1c:  Lab Results  Component Value Date   HGBA1C 6.4 (H) 07/19/2018   Urine Drug Screen: No results found for: LABOPIA, COCAINSCRNUR, LABBENZ, AMPHETMU, THCU, LABBARB  Alcohol Level     Component Value Date/Time   ETH 24 (H) 07/17/2018 1926    IMAGING No results found. EEG 07/18/18 : focal left hemispheric dysfunction consistent with his hemorrhage. No epileptiform activity  PHYSICAL EXAM Obese middle-aged African-American male not in distress. . Afebrile. Head is nontraumatic. Neck is supple without bruit.    Cardiac exam no murmur or gallop. Lungs are clear to  auscultation. Distal pulses are well felt.   Neurological Exam :  Awake alert expressive greater than receptive aphasia. Speaks a few words and occasional short sentences.  Follows one and a few two-step commands. Able to follow gaze in all directions but does have left gaze preference. Able to look to the right past midline. Blinks to threat on the left but not on the right. Right lower facial weakness. Tongue midline. Motor system exam shows dense right hemiplegia with withdrawal in the right lower extremity and none in the right upper extremity. Has spontaneous antigravity movements on the left side. Decreased response to pain. Now on the right compared to the left. Gait not tested ASSESSMENT/PLAN [name] is a 58 y.o. male with history seizures, hypertension and hyperlipidemia presenting with right-sided weakness, leftward gaze.   Stroke:  left basal ganglia hemorrhage w/ IVH and cerebral  edema w/ midline shift. ICH etiology unclear, BP not really high on admission. Given pt so awake with a large hemorrhage, concerned for possible underlying mass lesion with hemorrhage  Code Stroke CT head large L BG ICH w/ edema and mass effect L lat ventricle w/ 50mm L to R shift. trace ICH R lateral ventricle. R lat ventriculometry.   CTA head patent  CTA neck patent. Mild spinal spondylosis  F/u CT slight interval contraction large L BG ICH 110cc->106cc. IVH slightly increased. Similar mass effect w/ 4mm L to R shift w/ increased dilatation R temp horn R lat ventricle ? trapping  MRI w/w/o large left basal ganglia hemorrhage. No abnormal enhancing lesion or tumor  2D Echo  Normal ejection fraction 60-65 percent. No wall motion abnormalities.  LDL 118 mg percent  HgbA1c 6.4 SCDs for VTE prophylaxis NPO. For  MBSS this afternoon. Diet per SLP  No antithrombotic prior to admission, now on No antithrombotic given hemorrhage  Therapy recommendations:  CLR  Disposition:  pending   Cerebral  Edema Induced Hypernatremia  On 3% @ 75 via PIV  Na 142  Goal Na 150-155  Check Na q6h  Continue for now.   Consider PICC if ongoing treatment needed  Hypertension  BP max 162/100  Now maxed out on Cleviprex  SBP goal < 140  Hyperlipidemia  Home meds:  None listed at this time  LDL 118 mg percent, goal < 70  Hold statin at present given ICH  Seizures  On Keppra PTA  EEG focal left hemispheric dysfunction consistent with patient's known ICH. No seizures.  Other Stroke Risk Factors  Obesity, There is no height or weight on file to calculate BMI., recommend weight loss, diet and exercise as appropriate   Other Active Problems  Thrombocytopenia PLT 131   Hospital day # 4     . He has presented with a large intracerebral hemorrhage but etiology remains indeterminate as his blood pressure has not been significantly elevated and his neurological exam looks a lot  better than his scans. Discontinued hypertonic saline protocol.Recommend strict control of hypertension with systolic blood pressure goal below 160 .  No family available at the bedside for discussion. Mobilize out of bed. Therapy and rehabilitation consults.  Hopefully transfer to rehabilitation on the next few days and he is medically stable for transfer when bed available     Delia Heady, MD Medical Director [facility] Pager: [phone] 07/21/2018 3:21 PM  To contact Stroke Continuity provider, please refer to [email or web address]. After hours, contact General Neurology

## 2018-07-21 NOTE — Progress Notes (Signed)
Physical Therapy Treatment Patient Details Name: Jamie Stewart MRN: [id number] DOB: 08/21/1955 Today's Date: 07/21/2018    History of Present Illness Pt is a 58 y/o male with no documented PMH, who presented to the ED with unresponsiveness and left gaze. CT revealed a large hematoma centered within the left basal ganglia measuring up to 8.8 cm, 110 cc. Associated edema and mass effect effaces the left lateral ventricle, trace volume of hemorrhage within the occipital horn of right lateral ventricle.    PT Comments    Patient progressing slowly towards PT goals. Tolerated dynamic and static sitting balance with UE support and max cues for spinal extension/upright posture. Follows ~50% of simple 1 step commands with increased time and gestures. Tolerated standing bouts and weight shifting with Mod A of 2 for support/stability. Right knee instability at times. Requires Max A of 2 for SPT due to strong right lateral lean and right knee buckling. Pushing tendencies noted towards right. Of note, food found pocketed in right cheek. RN notified. Will follow.    Follow Up Recommendations  CIR;Supervision/Assistance - 24 hour     Equipment Recommendations  Other (comment)(defer)    Recommendations for Other Services       Precautions / Restrictions Precautions Precautions: Fall Restrictions Weight Bearing Restrictions: No    Mobility  Bed Mobility Overal bed mobility: Needs Assistance Bed Mobility: Rolling;Sidelying to Sit Rolling: Mod assist Sidelying to sit: Mod assist;+2 for physical assistance       General bed mobility comments: Increased time and step by step cues to reach for rail, bring LEs off bed and elevate trunk. Noted strong Rt lateral lean but pt able to help hold himself midline with LUE on foot board.   Transfers Overall transfer level: Needs assistance Equipment used: 2 person hand held assist Transfers: Sit to/from Stand Sit to Stand: Mod assist;+2 physical  assistance Stand pivot transfers: Max assist;+2 physical assistance       General transfer comment: ASsist of 2 to power to standing with cues for hip extension and upright posture; right lateral lean in standing. Right knee partial buckling during weight shifting. Stood from [employer]. SPT to chair with Max A of 2 as pt sitting prematurely- with worsened right lateral lean and right knee buckling.  Ambulation/Gait             General Gait Details: Unable to progress gait training at this time.    Stairs             Wheelchair Mobility    Modified Rankin (Stroke Patients Only) Modified Rankin (Stroke Patients Only) Pre-Morbid Rankin Score: No symptoms Modified Rankin: Severe disability     Balance Overall balance assessment: Needs assistance Sitting-balance support: Feet supported;Single extremity supported Sitting balance-Leahy Scale: Poor Sitting balance - Comments: Max assist at times or UE support for sitting balance. Worked on upright posture and activating spinal extensors. Pushing at times towards right.  Postural control: Posterior lean;Right lateral lean Standing balance support: During functional activity Standing balance-Leahy Scale: Zero Standing balance comment: +2 required for standing balance; weight shifting x6 in standing with assist for upright and right knee stability.                             Cognition Arousal/Alertness: Awake/alert Behavior During Therapy: Flat affect Overall Cognitive Status: Impaired/Different from baseline Area of Impairment: Following commands;Problem solving;Safety/judgement  Following Commands: Follows one step commands with increased time;Follows one step commands inconsistently Safety/Judgement: Decreased awareness of safety;Decreased awareness of deficits   Problem Solving: Difficulty sequencing;Requires verbal cues;Requires tactile cues General Comments: Able to state his  name "Okey," able to nod yes to being February and to being in the hospital. Most words nonsensical. Following ~50% of simple 1 step commands.       Exercises      General Comments        Pertinent Vitals/Pain Pain Assessment: Faces Faces Pain Scale: No hurt    Home Living                      Prior Function            PT Goals (current goals can now be found in the care plan section) Progress towards PT goals: Progressing toward goals(slowly)    Frequency    Min 4X/week      PT Plan Current plan remains appropriate    Co-evaluation PT/OT/SLP Co-Evaluation/Treatment: Yes Reason for Co-Treatment: To address functional/ADL transfers;Necessary to address cognition/behavior during functional activity PT goals addressed during session: Mobility/safety with mobility        AM-PAC PT "6 Clicks" Mobility   Outcome Measure  Help needed turning from your back to your side while in a flat bed without using bedrails?: A Lot Help needed moving from lying on your back to sitting on the side of a flat bed without using bedrails?: A Lot Help needed moving to and from a bed to a chair (including a wheelchair)?: Total Help needed standing up from a chair using your arms (e.g., wheelchair or bedside chair)?: Total Help needed to walk in hospital room?: Total Help needed climbing 3-5 steps with a railing? : Total 6 Click Score: 8    End of Session Equipment Utilized During Treatment: Gait belt Activity Tolerance: Patient tolerated treatment well Patient left: in chair;with call bell/phone within reach;with chair alarm set Nurse Communication: Mobility status PT Visit Diagnosis: Hemiplegia and hemiparesis;Other symptoms and signs involving the nervous system (R29.898) Hemiplegia - Right/Left: Right Hemiplegia - caused by: Nontraumatic intracerebral hemorrhage     Time: 0927-0952 PT Time Calculation (min) (ACUTE ONLY): 25 min  Charges:  $Neuromuscular  Re-education: 8-22 mins                     Mylo Red, PT, DPT Acute Rehabilitation Services Pager [phone] Office [phone]       Blake Divine A Lanier Ensign 07/21/2018, 10:22 AM

## 2018-07-21 NOTE — Care Management Note (Signed)
Case Management Note  Patient Details  Name: Jamie Stewart MRN: [id number] Date of Birth: 08/21/1955  Subjective/Objective:       Pt admitted with a ICH. He is aphasic and unable to answer questions or write answers. He is from home with his brother [name]: [phone]. Per [name] he's not sure that his brother has insurance. [name] can provide some supervision but not 24 hour d/t he goes to HD 3 days a week.            Action/Plan: Recommendations are for CIR. CM updated Genie with CIR that [name] will be at the hospital around 3 pm today. CM will also attempt to meet and work on d/c plans. CSW updated.  Expected Discharge Date:                  Expected Discharge Plan:  IP Rehab Facility  In-House Referral:     Discharge planning Services  CM Consult  Post Acute Care Choice:    Choice offered to:     DME Arranged:    DME Agency:     HH Arranged:    HH Agency:     Status of Service:  In process, will continue to follow  If discussed at Long Length of Stay Meetings, dates discussed:    Additional Comments:  Kermit Balo, RN 07/21/2018, 1:06 PM

## 2018-07-22 DIAGNOSIS — G936 Cerebral edema: Secondary | ICD-10-CM

## 2018-07-22 LAB — CULTURE, BLOOD (ROUTINE X 2)
CULTURE: NO GROWTH
Culture: NO GROWTH
Special Requests: ADEQUATE

## 2018-07-22 NOTE — Progress Notes (Signed)
IP rehab admissions - Patient lives with his brother.  Patient assisted his brother who is a HD patient prior to admission.  Brother cannot take care of patient.  Thus far, no one can provide supervision for patient after a potential inpatient rehab stay.  Likely patient will need SNF placement.  I have talked with case manager, Tresa Endo, about the above information.  Call me for questions.  [phone]

## 2018-07-22 NOTE — Progress Notes (Signed)
STROKE TEAM PROGRESS NOTE   INTERVAL HISTORY No one  is at the bedside.   Marland Kitchen His neurological examination is unchanged. No changes Vitals:   07/22/18 0300 07/22/18 0807 07/22/18 1200 07/22/18 1551  BP: (!) 141/92 (!) 144/97 (!) 130/96 (!) 147/101  Pulse: 86 74 96 87  Resp: 20 17 18 15   Temp: 99 F (37.2 C) 98.7 F (37.1 C) 98.7 F (37.1 C) 100.2 F (37.9 C)  TempSrc: Oral Oral Oral Oral  SpO2: 97% 97% 97% 96%  Weight:        CBC:  Recent Labs  Lab 07/17/18 1926 07/19/18 1345  WBC 5.6 5.6  NEUTROABS 4.2  --   HGB 12.8* 13.2  HCT 38.1* 40.6  MCV 98.4 98.3  PLT 131* 130*    Basic Metabolic Panel:  Recent Labs  Lab 07/17/18 1926 07/17/18 1933  07/20/18 0807 07/20/18 1406  NA 145  --    < > 146* 147*  K 3.7  --   --   --   --   CL 103  --   --   --   --   CO2 19*  --   --   --   --   GLUCOSE 119*  --   --   --   --   BUN 7  --   --   --   --   CREATININE 0.83 0.60*  --   --   --   CALCIUM 9.1  --   --   --   --    < > = values in this interval not displayed.   Lipid Panel:     Component Value Date/Time   CHOL 266 (H) 07/19/2018 0422   TRIG 340 (H) 07/19/2018 0422   HDL 80 07/19/2018 0422   CHOLHDL 3.3 07/19/2018 0422   VLDL 68 (H) 07/19/2018 0422   LDLCALC 118 (H) 07/19/2018 0422   HgbA1c:  Lab Results  Component Value Date   HGBA1C 6.4 (H) 07/19/2018   Urine Drug Screen: No results found for: LABOPIA, COCAINSCRNUR, LABBENZ, AMPHETMU, THCU, LABBARB  Alcohol Level     Component Value Date/Time   ETH 24 (H) 07/17/2018 1926    IMAGING No results found. EEG 07/18/18 : focal left hemispheric dysfunction consistent with his hemorrhage. No epileptiform activity  PHYSICAL EXAM Obese middle-aged African-American male not in distress. . Afebrile. Head is nontraumatic. Neck is supple without bruit.    Cardiac exam no murmur or gallop. Lungs are clear to auscultation. Distal pulses are well felt.   Neurological Exam :  Awake alert expressive greater than  receptive aphasia. Speaks a few words and occasional short sentences.  Follows one and a few two-step commands. Able to follow gaze in all directions but does have left gaze preference. Able to look to the right past midline. Blinks to threat on the left but not on the right. Right lower facial weakness. Tongue midline. Motor system exam shows dense right hemiplegia with withdrawal in the right lower extremity and none in the right upper extremity. Has spontaneous antigravity movements on the left side. Decreased response to pain. Now on the right compared to the left. Gait not tested ASSESSMENT/PLAN Jamie Stewart is a 58 y.o. male with history seizures, hypertension and hyperlipidemia presenting with right-sided weakness, leftward gaze.   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 ICH etiology unclear, BP not really high on admission. Given pt so awake  with a large hemorrhage, concerned for possible underlying mass lesion with hemorrhage  Code Stroke CT head large L BG ICH w/ edema and mass effect L lat ventricle w/ 33mm L to R shift. trace ICH R lateral ventricle. R lat ventriculometry.   CTA head patent  CTA neck patent. Mild spinal spondylosis  F/u CT slight interval contraction large L BG ICH 110cc->106cc. IVH slightly increased. Similar mass effect w/ 59mm L to R shift w/ increased dilatation R temp horn R lat ventricle ? trapping  MRI w/w/o large left basal ganglia hemorrhage. No abnormal enhancing lesion or tumor  2D Echo  Normal ejection fraction 60-65 percent. No wall motion abnormalities.  LDL 118 mg percent  HgbA1c 6.4 SCDs for VTE prophylaxis NPO. For  MBSS this afternoon. Diet per SLP  No antithrombotic prior to admission, now on No antithrombotic given hemorrhage  Therapy recommendations:  CLR  Disposition:  pending   Cerebral Edema Induced Hypernatremia  On 3% @ 75 via PIV  Na 142  Goal Na 150-155  Check Na  q6h  Continue for now.   Consider PICC if ongoing treatment needed  Hypertension  BP max 162/100  Now maxed out on Cleviprex  SBP goal < 140  Hyperlipidemia  Home meds:  None listed at this time  LDL 118 mg percent, goal < 70  Hold statin at present given ICH  Seizures  On Keppra PTA  EEG focal left hemispheric dysfunction consistent with patient's known ICH. No seizures.  Other Stroke Risk Factors  Obesity, There is no height or weight on file to calculate BMI., recommend weight loss, diet and exercise as appropriate   Other Active Problems  Thrombocytopenia PLT 131   Hospital day # 5     .  Marland Kitchen  No family available at the bedside for discussion. Mobilize out of bed. Therapy and rehabilitation consults.  Hopefully transfer to rehabilitation on the next few days and he is medically stable for transfer when bed available     Delia Heady, MD Medical Director [facility] Pager: [phone] 07/22/2018 4:58 PM  To contact Stroke Continuity provider, please refer to [email or web address]. After hours, contact General Neurology

## 2018-07-22 NOTE — Progress Notes (Signed)
Physical Therapy Treatment Patient Details Name: Jamie Stewart MRN: [id number] DOB: 08/21/1955 Today's Date: 07/22/2018    History of Present Illness Pt is a 58 y/o male with no documented PMH, who presented to the ED with unresponsiveness and left gaze. CT revealed a large hematoma centered within the left basal ganglia measuring up to 8.8 cm, 110 cc. Associated edema and mass effect effaces the left lateral ventricle, trace volume of hemorrhage within the occipital horn of right lateral ventricle.    PT Comments    Patient progressing slowly towards PT goals. More initiation noted with bed mobility today but still requires step by step cues and tactile cues to perform task. Able to tolerate seconds of sitting EOB without UE support but then would need Mod A due to LOB to the right or posteriorly. Worked on trunk control/finding midline and upright posture in sitting and standing. Stood from [employer] x2, with assist of 2 and therapist supporting right knee. Pt with strong right heavy lateral lean. Difficulty assessing cognition due to language deficits. Difficulty following commands consistently. Will follow.    Follow Up Recommendations  CIR;Supervision/Assistance - 24 hour     Equipment Recommendations  Other (comment)(defer)    Recommendations for Other Services       Precautions / Restrictions Precautions Precautions: Fall Restrictions Weight Bearing Restrictions: No    Mobility  Bed Mobility Overal bed mobility: Needs Assistance Bed Mobility: Rolling;Sidelying to Sit Rolling: Mod assist Sidelying to sit: Mod assist;+2 for physical assistance       General bed mobility comments: Increased time and step by step cues to reach for rail, bring LEs off bed and elevate trunk. Pushed through LLE to scoot bottom. Noted strong Rt lateral lean but pt able to help hold himself midline with LUE on foot board.   Transfers Overall transfer level: Needs assistance Equipment used: 2 person  hand held assist Transfers: Sit to/from Stand Sit to Stand: Mod assist;+2 physical assistance Stand pivot transfers: Max assist;+2 physical assistance       General transfer comment: Assist of 2 to power to standing with pt pulling up on back of recliner and therapist supporting right knee. Heavy right lateral lean. Max cues for upright and midline posture. Stood from [employer]. SPT Max A of 2 with pt unable to advance BLEs.  Ambulation/Gait             General Gait Details: Unable to progress gait training at this time.    Stairs             Wheelchair Mobility    Modified Rankin (Stroke Patients Only)       Balance Overall balance assessment: Needs assistance Sitting-balance support: Feet supported;Single extremity supported Sitting balance-Leahy Scale: Poor Sitting balance - Comments: Requires UE support on foot board for balance or Mod A for static sitting; worked on finding midline and posture/activating trunk.  Postural control: Posterior lean;Right lateral lean Standing balance support: During functional activity Standing balance-Leahy Scale: Zero Standing balance comment: +2 required for standing balance with therapist supporting right knee; worked on weight shifting and finding midline due to heavy right lateral lean.                            Cognition Arousal/Alertness: Awake/alert Behavior During Therapy: Flat affect Overall Cognitive Status: Difficult to assess Area of Impairment: Orientation;Following commands;Problem solving  Orientation Level: Disoriented to;Place;Time;Situation       Safety/Judgement: Decreased awareness of safety;Decreased awareness of deficits   Problem Solving: Difficulty sequencing;Requires verbal cues;Requires tactile cues General Comments: Able to state his name "Jamie Stewart," not nodding correctly to location or date likely due to receptive deficits.  Most words nonsensical. Following ~50% of  simple 1 step commands.       Exercises      General Comments        Pertinent Vitals/Pain Pain Assessment: Faces Faces Pain Scale: No hurt    Home Living                      Prior Function            PT Goals (current goals can now be found in the care plan section) Progress towards PT goals: Progressing toward goals(slowly)    Frequency    Min 4X/week      PT Plan Current plan remains appropriate    Co-evaluation              AM-PAC PT "6 Clicks" Mobility   Outcome Measure  Help needed turning from your back to your side while in a flat bed without using bedrails?: A Lot Help needed moving from lying on your back to sitting on the side of a flat bed without using bedrails?: A Lot Help needed moving to and from a bed to a chair (including a wheelchair)?: Total Help needed standing up from a chair using your arms (e.g., wheelchair or bedside chair)?: Total Help needed to walk in hospital room?: Total Help needed climbing 3-5 steps with a railing? : Total 6 Click Score: 8    End of Session Equipment Utilized During Treatment: Gait belt Activity Tolerance: Patient tolerated treatment well Patient left: in chair;with call bell/phone within reach;with chair alarm set Nurse Communication: Mobility status PT Visit Diagnosis: Hemiplegia and hemiparesis;Other symptoms and signs involving the nervous system (R29.898) Hemiplegia - Right/Left: Right Hemiplegia - dominant/non-dominant: Dominant Hemiplegia - caused by: Nontraumatic intracerebral hemorrhage     Time: 1000-1025 PT Time Calculation (min) (ACUTE ONLY): 25 min  Charges:  $Therapeutic Activity: 8-22 mins $Neuromuscular Re-education: 8-22 mins                     Mylo Red, PT, DPT Acute Rehabilitation Services Pager [phone] Office [phone]       Blake Divine A Lanier Ensign 07/22/2018, 12:07 PM

## 2018-07-22 NOTE — Progress Notes (Signed)
  Speech Language Pathology Treatment: Cognitive-Linquistic  Patient Details Name: Jamie Stewart MRN: [id number] DOB: 08/21/1955 Today's Date: 07/22/2018 Time: 0998-3382 SLP Time Calculation (min) (ACUTE ONLY): 20 min  Assessment / Plan / Recommendation Clinical Impression  Pt was seen for skilled ST treatment for aphasia today. Pt verbalized his full name independently, however required mod-max phonemic cues to state birth month and date. Given multiple choice, pt correctly matched basic functional objects with their written words with 100% accuracy with increased time. Pt required mod-max verbal cues (sentence completion, phonemic cues, models) to verbalize names of objects. Pt is making slow improvements toward expressive and receptive communication deficits; expressive language presents are more severely than basic receptive at this time. ST will continue to follow pt to provide cognitive-linguistic and dysphagia while in the acute setting.   HPI HPI: Jamie Stewart is a 58 y.o. male with no documented history, based on his medications bottles, history of seizures and hypertension hyperlipidemia, presented to the emergency with unresponsiveness and left gaze. Per chart he is completely aphasic unable to provide any history. CT large hematoma centered within the left basal ganglia measuring up to 8.8 cm, 110 cc. Associated edema and mass effect effaces the left lateral ventricle, trace volume of hemorrhage within the occipital horn of right lateral ventricle. Right lateral ventriculomegaly may be due to ex vacuo dilatation from brain parenchymal volume loss and chronic right inferolateral frontal lobe infarction or possibly early entrapment, attention at follow-up recommended. CXR No acute cardiopulmonary abnormality seen. No new imaging since 2/14      SLP Plan  Continue with current plan of care       Recommendations                   Oral Care Recommendations: Oral care BID Follow up  Recommendations: Inpatient Rehab;Skilled Nursing facility SLP Visit Diagnosis: Aphasia (R47.01) Plan: Continue with current plan of care       Suzzette Righter, Student SLP                 Suzzette Righter 07/22/2018, 4:19 PM

## 2018-07-23 LAB — GLUCOSE, CAPILLARY: Glucose-Capillary: 128 mg/dL — ABNORMAL HIGH (ref 70–99)

## 2018-07-23 MED ORDER — IRBESARTAN 300 MG PO TABS
300.0000 mg | ORAL_TABLET | Freq: Every day | ORAL | Status: DC
  Administered 2018-07-23 – 2018-08-01 (×10): 300 mg via ORAL
  Filled 2018-07-23 (×9): qty 1

## 2018-07-23 NOTE — Progress Notes (Signed)
Occupational Therapy Treatment Patient Details Name: Jamie Stewart MRN: [id number] DOB: 08/21/1955 Today's Date: 07/23/2018    History of present illness Pt is a 58 y/o male with no documented PMH, who presented to the ED with unresponsiveness and left gaze. CT revealed a large hematoma centered within the left basal ganglia measuring up to 8.8 cm, 110 cc. Associated edema and mass effect effaces the left lateral ventricle, trace volume of hemorrhage within the occipital horn of right lateral ventricle.   OT comments  Pt just finishing with PT as OT entered the room. Skilled OT intervention with focus on R attention and self ROM for R flaccid UE. Pt needing hand over hand assistance to incorporate R UE into grooming task this session while seated in recliner chair. Pt needing mod cuing and increased time to locate R UE and grasp with L UE for self ROM exercises. OT provided max demonstrational and tactile cuing for pt to attend to R UE and execute exercises correctly. Pt performing 10 reps of self ROM in all planes for R UE digits, wrist and elbow. OT assisted with PROM of shoulder in all planes for safety. Pt remained in chair with call bell and all needs within reach. Chair alarm activated.   Follow Up Recommendations  SNF    Equipment Recommendations  Other (comment)(defer to next venue of care)       Precautions / Restrictions Precautions Precautions: Fall Restrictions Weight Bearing Restrictions: No       Mobility Bed Mobility Overal bed mobility: Needs Assistance Bed Mobility: Rolling;Sidelying to Sit Rolling: Total assist;+2 for physical assistance Sidelying to sit: Mod assist;+2 for physical assistance       General bed mobility comments: Pt seated in recliner chair as OT entered the room  Transfers Overall transfer level: Needs assistance Equipment used: 2 person hand held assist Transfers: Sit to/from Stand Sit to Stand: Mod assist;+2 physical assistance;+2  safety/equipment         General transfer comment: Assist of 2 to power to standing with pt pulling up on sara stedy and therapist supporting R side trunk. Heavy right lateral lean. Max cues for upright and midline posture. Stood from EOB to sara stedy.     Balance Overall balance assessment: Needs assistance Sitting-balance support: Feet supported;Single extremity supported Sitting balance-Leahy Scale: Poor Sitting balance - Comments: Pt requires support of floor contact for seated balance and modA for static sitting. Worked on finding midline and posture/activating trunk musculature for adequate support. Pt required constant VC/TC for recovering from hard R lateral lean.  Pt able to use BUE loading to sway/shift back and forth given maxA on RUE via PTA stabillization.  Postural control: Posterior lean;Right lateral lean Standing balance support: During functional activity;Single extremity supported Standing balance-Leahy Scale: Zero Standing balance comment: +2 required for standing balance with support for R knee from sara stedy; worked on weight shifting and finding midline due to heavy right lateral lean.         ADL either performed or assessed with clinical judgement   ADL       Grooming: Wash/dry face;Sitting;Total assistance Grooming Details (indicate cue type and reason): hand over hand assistance to incorporate R UE into functional tasks                     Cognition Arousal/Alertness: Awake/alert Behavior During Therapy: Flat affect Overall Cognitive Status: Difficult to assess Area of Impairment: Orientation;Following commands;Problem solving       Orientation Level:  Disoriented to;Place;Time;Situation     Following Commands: Follows one step commands with increased time;Follows one step commands inconsistently Safety/Judgement: Decreased awareness of safety;Decreased awareness of deficits   Problem Solving: Difficulty sequencing;Requires verbal  cues;Requires tactile cues General Comments: Pt able to identify RUE as his own but unable to initiate use of it at all. Pt able to communicate with simple head nods and mumbling nonsensically.         Exercises General Exercises - Lower Extremity Ankle Circles/Pumps: AROM;10 reps;Both;Limitations;PROM Ankle Circles/Pumps Limitations: Pt flaccid on R side and only able to tolerate PROM. Quad Sets: AROM;10 reps;Both;Limitations;PROM Quad Sets Limitations: Pt flaccid on R side and only able to tolerate PROM. Heel Slides: AROM;10 reps;Both;Limitations;PROM Heel Slides Limitations: Pt flaccid on R side and only able to tolerate PROM.           Pertinent Vitals/ Pain       Pain Assessment: Faces Faces Pain Scale: No hurt         Frequency  Min 2X/week        Progress Toward Goals  OT Goals(current goals can now be found in the care plan section)  Progress towards OT goals: Progressing toward goals  Acute Rehab OT Goals Patient Stated Goal: Pt did not state goals during session  Plan Discharge plan needs to be updated       AM-PAC OT "6 Clicks" Daily Activity     Outcome Measure   Help from another person eating meals?: A Lot Help from another person taking care of personal grooming?: A Lot Help from another person toileting, which includes using toliet, bedpan, or urinal?: Total Help from another person bathing (including washing, rinsing, drying)?: A Lot Help from another person to put on and taking off regular upper body clothing?: A Lot Help from another person to put on and taking off regular lower body clothing?: Total 6 Click Score: 10    End of Session    OT Visit Diagnosis: Muscle weakness (generalized) (M62.81);Hemiplegia and hemiparesis Hemiplegia - Right/Left: Right Hemiplegia - dominant/non-dominant: Dominant Hemiplegia - caused by: Nontraumatic intracerebral hemorrhage   Activity Tolerance Patient tolerated treatment well   Patient Left in  chair;with call bell/phone within reach;with chair alarm set           Time: 1003-1020 OT Time Calculation (min): 17 min  Charges: OT General Charges $OT Visit: 1 Visit OT Treatments $Neuromuscular Re-education: 8-22 mins   Alen Bleacher, MS, OTR/L 07/23/2018, 12:37 PM

## 2018-07-23 NOTE — Progress Notes (Addendum)
STROKE TEAM PROGRESS NOTE   INTERVAL HISTORY No one  is at the bedside. Pt up in chair. Neuro stable. BP stable. Will check labs in am.  Vitals:   07/22/18 2035 07/22/18 2351 07/23/18 0351 07/23/18 0759  BP: (!) 138/98 (!) 135/91 (!) 139/91 (!) 133/97  Pulse: 81 97 85 81  Resp: 18 18 18 19   Temp: 98.9 F (37.2 C) 98.5 F (36.9 C) 98.7 F (37.1 C) 99.4 F (37.4 C)  TempSrc: Oral Oral Oral Oral  SpO2: 97% 94% 95% 94%  Weight:        PHYSICAL EXAM per Dr. Pearlean Brownie Obese middle-aged African-American male not in distress. . Afebrile. Head is nontraumatic. Neck is supple without bruit.    Cardiac exam no murmur or gallop. Lungs are clear to auscultation. Distal pulses are well felt.  Neurological Exam :  Awake alert expressive greater than receptive aphasia. Speaks a few words and occasional short sentences.  Follows one and a few two-step commands. Able to follow gaze in all directions but does have left gaze preference. Able to look to the right past midline. Blinks to threat on the left but not on the right. Right lower facial weakness. Tongue midline. Motor system exam shows dense right hemiplegia with withdrawal in the right lower extremity and none in the right upper extremity. Has spontaneous antigravity movements on the left side. Decreased response to pain. Now on the right compared to the left. Gait not tested   ASSESSMENT/PLAN Jamie Stewart is a 58 y.o. male with history seizures, hypertension and hyperlipidemia presenting with right-sided weakness, leftward gaze.   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 ICH etiology unclear, BP not really high on admission. Given pt so awake with a large hemorrhage, concerned for possible underlying mass lesion with hemorrhage  Code Stroke CT head large L BG ICH w/ edema and mass effect L lat ventricle w/ 32mm L to R shift. trace ICH R lateral ventricle. R lat ventriculometry.   CTA head patent  CTA  neck patent. Mild spinal spondylosis  F/u CT slight interval contraction large L BG ICH 110cc->106cc. IVH slightly increased. Similar mass effect w/ 74mm L to R shift w/ increased dilatation R temp horn R lat ventricle ? trapping  MRI w/w/o large left basal ganglia hemorrhage. No abnormal enhancing lesion or tumor  2D Echo  Normal ejection fraction 60-65 percent. No wall motion abnormalities.  LDL 118 mg percent  HgbA1c 6.4  SCDs for VTE prophylaxis  No antithrombotic prior to admission, now on No antithrombotic given hemorrhage  Therapy recommendations:  CLR. No support at home. Plan SNF.  Disposition:  Pending. Await SNF bed. Medically ready for d/c.  Cerebral Edema Induced Hypernatremia  Treated with 3% protocol in ICU  Last Na 147  resolving  Hypertension  Stable 130-140s  Treated with Cleviprex in ICU  Home meds: coreg, avapro, imdur  Continue to monitor  Restarted avapro today  Resume other meds as appropriate  SBP goal < 180  Follow BP  Hyperlipidemia  Home meds:  zocor 20  LDL 118 mg percent, goal < 70  Hold statin at present given ICH  Seizures  On Keppra PTA  EEG focal left hemispheric dysfunction consistent with patient's known ICH. No seizures.  Other Stroke Risk Factors  Obesity, There is no height or weight on file to calculate BMI., recommend weight loss, diet and exercise as appropriate   Other Active Problems  Thrombocytopenia  PLT 131   Hospital day # 6  [name], MSN, APRN, ANVP-BC, AGPCNP-BC Advanced Practice Stroke Nurse [facility] See Amion for Schedule & Pager information 07/23/2018 1:27 PM  I have personally obtained history,examined this patient, reviewed notes, independently viewed imaging studies, participated in medical decision making and plan of care.ROS completed by me personally and pertinent positives fully documented  I have made any additions or clarifications directly to the above note.  Agree with note above.    Delia Heady, MD Medical Director [facility] Pager: [phone] 07/23/2018 3:07 PM  To contact Stroke Continuity provider, please refer to [email or web address]. After hours, contact General Neurology

## 2018-07-23 NOTE — Progress Notes (Signed)
  Speech Language Pathology Treatment: Dysphagia  Patient Details Name: Jamie Stewart MRN: [id number] DOB: 08/21/1955 Today's Date: 07/23/2018 Time: 6606-3016 SLP Time Calculation (min) (ACUTE ONLY): 19 min  Assessment / Plan / Recommendation Clinical Impression  Pt was alert and fully participatory in dysphagia treatment session today. Pt did not exhibit any overt s/s aspiration throughout trials of dysphagia 2 (minced), puree, and nectar thick liquid. During upgraded trial of dysphagia 2 (minced) solid, moderate right sided pocketing was observed and pt required max verbal and visual cues to clear with lingual sweep X2 and liquid wash. Min oral residue was observed with pt's consumption of puree. Pt demonstrated impulsivity, evidenced in quick, large bites and sips, requiring max verbal/tactile cues to slow rate and take smaller bites/sips. Recommend continue dysphagia 1 (puree) diet and nectar thick liquids with full supervision to cue for swallow precautions and compensatory strategies. Pt may need manual assistance to clear pocketed food after meals.    HPI HPI: Jamie Stewart is a 58 y.o. male with no documented history, based on his medications bottles, history of seizures and hypertension hyperlipidemia, presented to the emergency with unresponsiveness and left gaze. Per chart he is completely aphasic unable to provide any history. CT large hematoma centered within the left basal ganglia measuring up to 8.8 cm, 110 cc. Associated edema and mass effect effaces the left lateral ventricle, trace volume of hemorrhage within the occipital horn of right lateral ventricle. Right lateral ventriculomegaly may be due to ex vacuo dilatation from brain parenchymal volume loss and chronic right inferolateral frontal lobe infarction or possibly early entrapment, attention at follow-up recommended. CXR No acute cardiopulmonary abnormality seen. No new imaging since 2/14      SLP Plan  Continue with current  plan of care       Recommendations  Diet recommendations: Dysphagia 1 (puree);Nectar-thick liquid Liquids provided via: Cup Medication Administration: Crushed with puree Supervision: Full supervision/cueing for compensatory strategies Compensations: Slow rate;Small sips/bites;Follow solids with liquid;Clear throat intermittently;Lingual sweep for clearance of pocketing Postural Changes and/or Swallow Maneuvers: Seated upright 90 degrees                Oral Care Recommendations: Oral care BID Follow up Recommendations: Inpatient Rehab;Skilled Nursing facility SLP Visit Diagnosis: Dysphagia, oral phase (R13.11) Plan: Continue with current plan of care       Suzzette Righter, Student SLP                 Suzzette Righter 07/23/2018, 3:45 PM

## 2018-07-23 NOTE — Progress Notes (Signed)
Physical Therapy Treatment Patient Details Name: Jamie Stewart MRN: [id number] DOB: 08/21/1955 Today's Date: 07/23/2018    History of Present Illness Pt is a 58 y/o male with no documented PMH, who presented to the ED with unresponsiveness and left gaze. CT revealed a large hematoma centered within the left basal ganglia measuring up to 8.8 cm, 110 cc. Associated edema and mass effect effaces the left lateral ventricle, trace volume of hemorrhage within the occipital horn of right lateral ventricle.    PT Comments    Pt alert and willing to participate in therapy. Pt remains with very hard R posterolateral lean and at times pushes himself further to his flaccid side. Pt requires +2 for safety and equipment purposes. Pt able to perform bed rolling to L side with totalA +2  and pushes himself upright using LUE with modA +2. Pt requires constant assistance and support + max VC/TC for finding midline and establishing upright and centered posture. Plan to progress pt with more sit to stand activities from BLE strengthening and progress loading through RUE during seated balance. Based on current progress CIR discharge plan remains appropriate at this time.   Follow Up Recommendations  CIR;Supervision/Assistance - 24 hour     Equipment Recommendations  Other (comment)(defer)    Recommendations for Other Services Rehab consult     Precautions / Restrictions Precautions Precautions: Fall Restrictions Weight Bearing Restrictions: No    Mobility  Bed Mobility Overal bed mobility: Needs Assistance Bed Mobility: Rolling;Sidelying to Sit Rolling: Total assist;+2 for physical assistance Sidelying to sit: Mod assist;+2 for physical assistance       General bed mobility comments: Increased time and step by step cues to self assist RUE during rolling, assisted RLE into hooklying. Pt then required assist to roll with tactile cueing through his hips and scapula. Once in sidelying he required  assistance to bring LEs off bed and elevate trunk. Pt very capable of pushing himself off from sidelying using the LUE, but then requires blocking to avoid falling R posterolateral.  Pushed through LLE to scoot bottom. Noted strong Rt lateral lean but pt able to help hold himself midline with LUE on railing.  Transfers Overall transfer level: Needs assistance Equipment used: 2 person hand held assist Transfers: Sit to/from Stand Sit to Stand: Mod assist;+2 physical assistance;+2 safety/equipment         General transfer comment: Assist of 2 to power to standing with pt pulling up on sara stedy and therapist supporting R side trunk. Heavy right lateral lean. Max cues for upright and midline posture. Stood from EOB to sara stedy.   Ambulation/Gait             General Gait Details: Unable to progress gait training at this time.    Stairs             Wheelchair Mobility    Modified Rankin (Stroke Patients Only)       Balance Overall balance assessment: Needs assistance Sitting-balance support: Feet supported;Single extremity supported Sitting balance-Leahy Scale: Poor Sitting balance - Comments: Pt requires support of floor contact for seated balance and modA for static sitting. Worked on finding midline and posture/activating trunk musculature for adequate support. Pt required constant VC/TC for recovering from hard R lateral lean.  Pt able to use BUE loading to sway/shift back and forth given maxA on RUE via PTA stabillization.  Postural control: Posterior lean;Right lateral lean Standing balance support: During functional activity;Single extremity supported Standing balance-Leahy Scale: Zero Standing balance  comment: +2 required for standing balance with support for R knee from sara stedy; worked on weight shifting and finding midline due to heavy right lateral lean.                             Cognition Arousal/Alertness: Awake/alert Behavior During  Therapy: Flat affect Overall Cognitive Status: Difficult to assess Area of Impairment: Orientation;Following commands;Problem solving                 Orientation Level: Disoriented to;Place;Time;Situation     Following Commands: Follows one step commands with increased time;Follows one step commands inconsistently Safety/Judgement: Decreased awareness of safety;Decreased awareness of deficits   Problem Solving: Difficulty sequencing;Requires verbal cues;Requires tactile cues General Comments: Pt able to identify RUE as his own but unable to initiate use of it at all. Pt able to communicate with simple head nods and mumbling nonsensically.       Exercises General Exercises - Lower Extremity Ankle Circles/Pumps: AROM;10 reps;Both;Limitations;PROM Ankle Circles/Pumps Limitations: Pt flaccid on R side and only able to tolerate PROM. Quad Sets: AROM;10 reps;Both;Limitations;PROM Quad Sets Limitations: Pt flaccid on R side and only able to tolerate PROM. Heel Slides: AROM;10 reps;Both;Limitations;PROM Heel Slides Limitations: Pt flaccid on R side and only able to tolerate PROM.    General Comments        Pertinent Vitals/Pain Pain Assessment: No/denies pain    Home Living                      Prior Function            PT Goals (current goals can now be found in the care plan section) Acute Rehab PT Goals Patient Stated Goal: Pt did not state goals during session PT Goal Formulation: Patient unable to participate in goal setting Potential to Achieve Goals: Good    Frequency    Min 4X/week      PT Plan Current plan remains appropriate    Co-evaluation              AM-PAC PT "6 Clicks" Mobility   Outcome Measure  Help needed turning from your back to your side while in a flat bed without using bedrails?: A Lot Help needed moving from lying on your back to sitting on the side of a flat bed without using bedrails?: A Lot Help needed moving to and  from a bed to a chair (including a wheelchair)?: Total Help needed standing up from a chair using your arms (e.g., wheelchair or bedside chair)?: Total Help needed to walk in hospital room?: Total Help needed climbing 3-5 steps with a railing? : Total 6 Click Score: 8    End of Session   Activity Tolerance: Patient tolerated treatment well Patient left: in chair;with call bell/phone within reach;with chair alarm set Nurse Communication: Mobility status;Other (comment)(Pt with soiled bed pad and visible urinary pooling underneat. Nursing notified of potential condom cath leakage. Pt cleaned during standing on sara stedy lift. ) PT Visit Diagnosis: Hemiplegia and hemiparesis;Other symptoms and signs involving the nervous system (R29.898) Hemiplegia - Right/Left: Right Hemiplegia - dominant/non-dominant: Dominant Hemiplegia - caused by: Nontraumatic intracerebral hemorrhage     Time: 6811-5726 PT Time Calculation (min) (ACUTE ONLY): 44 min  Charges:  $Therapeutic Exercise: 8-22 mins $Therapeutic Activity: 8-22 mins $Neuromuscular Re-education: 8-22 mins  Margarita Mail, SPTA   Margarita Mail 07/23/2018, 11:25 AM

## 2018-07-23 NOTE — Care Management (Signed)
Spoke with Genie in CIR. Pt doesn't have the needed support after CIR. Pt will require SNF rehab. CM updated CSW.

## 2018-07-24 LAB — BASIC METABOLIC PANEL
Anion gap: 11 (ref 5–15)
BUN: 19 mg/dL (ref 6–20)
CO2: 22 mmol/L (ref 22–32)
Calcium: 9.2 mg/dL (ref 8.9–10.3)
Chloride: 106 mmol/L (ref 98–111)
Creatinine, Ser: 0.9 mg/dL (ref 0.61–1.24)
GFR calc Af Amer: >60 mL/min (ref >60)
GFR calc non Af Amer: >60 mL/min (ref >60)
GLUCOSE: 117 mg/dL — AB (ref 70–99)
POTASSIUM: 3.7 mmol/L (ref 3.5–5.1)
Sodium: 139 mmol/L (ref 135–145)

## 2018-07-24 LAB — CBC
HCT: 36.5 % — ABNORMAL LOW (ref 39.0–52.0)
Hemoglobin: 12.4 g/dL — ABNORMAL LOW (ref 13.0–17.0)
MCH: 33.4 pg (ref 26.0–34.0)
MCHC: 34 g/dL (ref 30.0–36.0)
MCV: 98.4 fL (ref 80.0–100.0)
Platelets: 143 10*3/uL — ABNORMAL LOW (ref 150–400)
RBC: 3.71 MIL/uL — AB (ref 4.22–5.81)
RDW: 14.7 % (ref 11.5–15.5)
WBC: 6.2 10*3/uL (ref 4.0–10.5)
nRBC: 0 % (ref 0.0–0.2)

## 2018-07-24 NOTE — Progress Notes (Addendum)
STROKE TEAM PROGRESS NOTE   INTERVAL HISTORY Jamie Stewart at the bedside. She just arrived today. Planning for SNF. SW following.   Vitals:   07/23/18 1951 07/23/18 2254 07/24/18 0333 07/24/18 0841  BP: (!) 143/87 (!) 143/95 (!) 128/91 (!) 137/99  Pulse: 81 79 70 70  Resp: 18 18 18 16   Temp: 98.6 F (37 C) 100.1 F (37.8 C) 98.5 F (36.9 C) 99 F (37.2 C)  TempSrc: Oral Oral Oral Oral  SpO2: 99% 93% 95% 98%  Weight:        PHYSICAL EXAM :Obese middle-aged African-American male not in distress. . Afebrile. Head is nontraumatic. Neck is supple without bruit.    Cardiac exam no murmur or gallop. Lungs are clear to auscultation. Distal pulses are well felt.  Neurological Exam :  Awake alert expressive greater than receptive aphasia. Speaks a few words and occasional short sentences.  Follows one and a few two-step commands. Able to follow gaze in all directions but does have left gaze preference. Able to look to the right past midline. Blinks to threat on the left but not on the right. Right lower facial weakness. Tongue midline. Motor system exam shows dense right hemiplegia with withdrawal in the right lower extremity and none in the right upper extremity. Has spontaneous antigravity movements on the left side. Decreased response to pain. Now on the right compared to the left. Gait not tested   ASSESSMENT/PLAN Jamie Stewart is a 58 y.o. male with history seizures, hypertension and hyperlipidemia presenting with right-sided weakness, leftward gaze.   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 ICH etiology unclear, BP not really high on admission. Given pt so awake with a large hemorrhage, concerned for possible underlying mass lesion with hemorrhage  Code Stroke CT head large L BG ICH w/ edema and mass effect L lat ventricle w/ 6mm L to R shift. trace ICH R lateral ventricle. R lat ventriculometry.   CTA head patent  CTA neck patent. Mild spinal  spondylosis  F/u CT slight interval contraction large L BG ICH 110cc->106cc. IVH slightly increased. Similar mass effect w/ 67mm L to R shift w/ increased dilatation R temp horn R lat ventricle ? trapping  MRI w/w/o large left basal ganglia hemorrhage. No abnormal enhancing lesion or tumor  2D Echo  Normal ejection fraction 60-65 percent. No wall motion abnormalities.  LDL 118 mg percent  HgbA1c 6.4  SCDs for VTE prophylaxis  No antithrombotic prior to admission, now on No antithrombotic given hemorrhage  Therapy recommendations:  CLR. No support at home. Plan SNF.  Disposition:  Pending. Await SNF bed. Medically ready for d/c.  Financials unknown SW clarifying. Will be difficult to place if no insurance.   Cerebral Edema Induced Hypernatremia, resolved  Treated with 3% protocol in ICU  Na 139  Hypertension  Stable 130-140s  Treated with Cleviprex in ICU  Home meds: coreg, avapro, imdur  Continue to monitor  Restarted avapro 2/19  BP 130ish since MN  Monitor BP for resumption of other meds  SBP goal < 180  Hyperlipidemia  Home meds:  zocor 20  LDL 118 mg percent, goal < 70  Hold statin at present given ICH  Seizures  On Keppra PTA. Resumed.  EEG focal left hemispheric dysfunction consistent with patient's known ICH. No seizures.  Other Stroke Risk Factors  Obesity, There is no height or weight on file to calculate BMI., recommend weight loss, diet and exercise as  appropriate   Other Active Problems  Thrombocytopenia PLT 131  Hospital day # 7  Jamie Main, MSN, APRN, ANVP-BC, AGPCNP-BC Advanced Practice Stroke Nurse [facility] See Amion for Schedule & Pager information 07/24/2018 9:59 AM  I have personally obtained history,examined this patient, reviewed notes, independently viewed imaging studies, participated in medical decision making and plan of care.ROS completed by me personally and pertinent positives fully documented  I  have made any additions or clarifications directly to the above note. Agree with note above. Await transfer to skilled nursing facility when bed available. Jamie Heady, MD Medical Director [facility] Pager: [phone] 07/24/2018 2:06 PM    To contact Stroke Continuity provider, please refer to [email or web address]. After hours, contact General Neurology

## 2018-07-24 NOTE — Progress Notes (Signed)
So plan is for SNF for patient at d/c. He doesn't have insurance so awaiting Medicaid status from financial counseling. CM met with patients sister from [place] and discussed plans. She is in agreement with SNF but doesn't know his financial assets. She is going to go through his stuff tonight and bring in whats needed for financial counseling. Financial counseling updated and provided sisters number [phone]. CM following.

## 2018-07-24 NOTE — Progress Notes (Signed)
  Speech Language Pathology Treatment: Dysphagia;Cognitive-Linquistic  Patient Details Name: Jamie Stewart MRN: [id number] DOB: 08/21/1955 Today's Date: 07/24/2018 Time: 1010-1034 SLP Time Calculation (min) (ACUTE ONLY): 24 min  Assessment / Plan / Recommendation Clinical Impression  Pt was seen for skilled ST treatment for dysphagia and aphasia today. After thorough oral care, pt accepted sips of upgraded thin H2O from cup and straw with 2 immediate cough responses out of approximately 12 trials. Use of straw facilitated greater oral containment of boluses and therefore less anterior loss. Pt continues to exhibit impulsivity during PO trials, requiring mod-max verbal and tactile cues to use swallow precautions. Would recommend repeat MBS in near future to further assess swallow function and determine appropriateness for solid/liquid advancement. For now, continue dysphagia 1 (puree) diet, nectar thick liquids, and full supervision to cue for use of swallow strategies.  During aphasia treatment, pt named 4/4 objects with mod-max semantic and phonemic cues. When provided a model, pt also used 1-4 word phrases to describe elements within a picture scene. Generative language at the word and phrase levels continues to present challenges, although pt is making slow progress. ST will continue to follow to provide treatment for both aphasia and dysphagia while in acute setting.     HPI HPI: Jamie Stewart is a 58 y.o. male with no documented history, based on his medications bottles, history of seizures and hypertension hyperlipidemia, presented to the emergency with unresponsiveness and left gaze. Per chart he is completely aphasic unable to provide any history. CT large hematoma centered within the left basal ganglia measuring up to 8.8 cm, 110 cc. Associated edema and mass effect effaces the left lateral ventricle, trace volume of hemorrhage within the occipital horn of right lateral ventricle. Right lateral  ventriculomegaly may be due to ex vacuo dilatation from brain parenchymal volume loss and chronic right inferolateral frontal lobe infarction or possibly early entrapment, attention at follow-up recommended. CXR No acute cardiopulmonary abnormality seen. No new imaging since 2/14      SLP Plan  Continue with current plan of care       Recommendations  Diet recommendations: Nectar-thick liquid;Dysphagia 1 (puree) Liquids provided via: Straw;Cup Medication Administration: Crushed with puree Supervision: Full supervision/cueing for compensatory strategies Compensations: Slow rate;Small sips/bites;Follow solids with liquid;Clear throat intermittently;Lingual sweep for clearance of pocketing Postural Changes and/or Swallow Maneuvers: Seated upright 90 degrees                Oral Care Recommendations: Oral care BID Follow up Recommendations: Inpatient Rehab;Skilled Nursing facility SLP Visit Diagnosis: Dysphagia, oropharyngeal phase (R13.12);Aphasia (R47.01) Plan: Continue with current plan of care       Jamie Stewart, Student SLP                 Jamie Stewart 07/24/2018, 10:38 AM

## 2018-07-24 NOTE — Progress Notes (Addendum)
Tx performed and charted by SPTA under direct guidance and supervision of PTA at all times. This session was performed under the supervision of a licensed clinician.   Charting reviewed for accuracy and depicts tx performed and services provided.   Joycelyn Rua, PTA Acute Rehabilitation Services Pager [phone] Office [phone]   Physical Therapy Treatment Patient Details Name: Jamie Stewart MRN: [id number] DOB: 08/21/1955 Today's Date: 07/24/2018    History of Present Illness Pt is a 58 y/o male with no documented PMH, who presented to the ED with unresponsiveness and left gaze. CT revealed a large hematoma centered within the left basal ganglia measuring up to 8.8 cm, 110 cc. Associated edema and mass effect effaces the left lateral ventricle, trace volume of hemorrhage within the occipital horn of right lateral ventricle.    PT Comments    Pt agreed to perform in functional strengthening activities. Pt sister was present at onset of therapy. Pt stated he felt fine after previous days therapy and that he had no pain today. PT remains unable to initiate movement on entire R side of body. Pt displayed an increased impulse to push to the right side during all activities and especially as fatigue sets in. Pt requires constant vc/tc to establish midline in standing and seated both with and without LUE support. Pt displayed poor safety awareness during use of stedy lift, and attempted multiple times to stand from the seat plates when attempting to move to his chair. Based on CIR denial, discharge plan to be changed to SNF. Supervising PT will be notified of need for change in recommendations at this time. Plan to progress balance activities and active assisted movement and strengthening of BLE as tolerated. Pt left with family members present, and all needs, phone/callbell, lunch tray within reach, and the chair alarm on.     Follow Up Recommendations  SNF     Equipment Recommendations   Other (comment)    Recommendations for Other Services Rehab consult     Precautions / Restrictions Precautions Precautions: Fall Restrictions Weight Bearing Restrictions: No    Mobility  Bed Mobility Overal bed mobility: Needs Assistance Bed Mobility: Rolling;Sidelying to Sit Rolling: +2 for physical assistance;Total assist Sidelying to sit: Mod assist;+2 for physical assistance       General bed mobility comments: Pt unable to initiate movement of RUE to help with rolling. Pt requires multiple vc/tc to grab RUE and pull across body with LUE. Pt limited d/t fatigue and requires multiple attempts to complete bed mobility.   Transfers Overall transfer level: Needs assistance Equipment used: 2 person hand held assist Transfers: Sit to/from Stand Sit to Stand: Mod assist;+2 physical assistance;+2 safety/equipment         General transfer comment: Assist of 2 to power to standing with pt pulling up on sara stedy and therapist supporting R side trunk. Heavy right lateral lean. Max cues for upright and midline posture. Stood from EOB to sara stedy.  Ambulation/Gait             General Gait Details: Unable to progress gait training at this time.    Stairs             Wheelchair Mobility    Modified Rankin (Stroke Patients Only) Modified Rankin (Stroke Patients Only) Pre-Morbid Rankin Score: No symptoms Modified Rankin: Severe disability     Balance Overall balance assessment: Needs assistance Sitting-balance support: Feet supported;Single extremity supported Sitting balance-Leahy Scale: Poor Sitting balance - Comments: Pt requires support of floor  contact for seated balance and modA for static sitting. Worked on finding midline and posture/activating trunk musculature for adequate support. Pt required constant VC/TC for recovering from hard R lateral lean.  Pt able to use BUE loading to sway/shift back and forth given maxA on RUE via PTA stabillization.   Postural control: Posterior lean;Right lateral lean Standing balance support: During functional activity;Single extremity supported Standing balance-Leahy Scale: Zero Standing balance comment: maxA +2 required for standing balance with support for R knee from sara stedy; worked on weight shifting and finding midline due to heavy right lateral lean/push.                             Cognition Arousal/Alertness: Awake/alert Behavior During Therapy: Flat affect Overall Cognitive Status: Difficult to assess Area of Impairment: Orientation;Following commands;Problem solving;Safety/judgement                 Orientation Level: Disoriented to;Place;Time;Situation     Following Commands: Follows one step commands with increased time;Follows one step commands inconsistently Safety/Judgement: Decreased awareness of safety;Decreased awareness of deficits   Problem Solving: Difficulty sequencing;Requires verbal cues;Requires tactile cues General Comments: Pt able to identify R side as his own but unable to initiate use of it at all. Pt able to communicate with simple head nods and mumbling nonsensically. Pt displayed poor understanding of safety when transferring with sara stedy.       Exercises General Exercises - Lower Extremity Ankle Circles/Pumps: AROM;10 reps;Both;Limitations;PROM Ankle Circles/Pumps Limitations: Pt flaccid on R side and only able to tolerate PROM. Quad Sets: AROM;10 reps;Both;Limitations;PROM Quad Sets Limitations: Pt flaccid on R side and only able to tolerate PROM. Gluteal Sets: PROM;AROM;10 reps;Limitations;Both Gluteal Sets Limitations: Pt flaccid on R side and only able to tolerate PROM. Attempted glute sets and pt progressed better with clinician assisted feedback in a partial bridge position.  Heel Slides: AROM;10 reps;Both;Limitations;PROM Heel Slides Limitations: Pt flaccid on R side and only able to tolerate PROM. Hip ABduction/ADduction:  PROM;AROM;10 reps;Limitations;Both Hip Abduction/Adduction Limitations: Pt flaccid on R side and only able to tolerate PROM. Mini-Sqauts: AROM;Limitations;5 reps;Both;Seated Mini Squats Limitations: Performed from stedy lift.     General Comments        Pertinent Vitals/Pain Pain Assessment: No/denies pain Faces Pain Scale: No hurt    Home Living                      Prior Function            PT Goals (current goals can now be found in the care plan section)      Frequency    Min 4X/week      PT Plan Current plan remains appropriate    Co-evaluation              AM-PAC PT "6 Clicks" Mobility   Outcome Measure  Help needed turning from your back to your side while in a flat bed without using bedrails?: A Lot Help needed moving from lying on your back to sitting on the side of a flat bed without using bedrails?: A Lot Help needed moving to and from a bed to a chair (including a wheelchair)?: Total Help needed standing up from a chair using your arms (e.g., wheelchair or bedside chair)?: Total Help needed to walk in hospital room?: Total Help needed climbing 3-5 steps with a railing? : Total 6 Click Score: 8    End of Session  Activity Tolerance: Patient tolerated treatment well Patient left: in chair;with call bell/phone within reach;with chair alarm set;with family/visitor present Nurse Communication: Mobility status;Other (comment) PT Visit Diagnosis: Hemiplegia and hemiparesis;Other symptoms and signs involving the nervous system (R29.898) Hemiplegia - Right/Left: Right Hemiplegia - dominant/non-dominant: Dominant Hemiplegia - caused by: Nontraumatic intracerebral hemorrhage     Time: 1127-1152 PT Time Calculation (min) (ACUTE ONLY): 25 min  Charges:  $Therapeutic Activity: 8-22 mins $Neuromuscular Re-education: 8-22 mins                     Margarita Mail, SPTA   Margarita Mail 07/24/2018, 12:33 PM

## 2018-07-25 NOTE — Progress Notes (Signed)
STROKE TEAM PROGRESS NOTE   INTERVAL HISTORY His sister from [place] is at the bedside.  . Planning for SNF. SW following. No changes. Stable  Vitals:   07/25/18 0335 07/25/18 0749 07/25/18 1122 07/25/18 1300  BP: (!) 139/102 (!) 140/99 (!) 138/101 (!) 147/97  Pulse: 78 76 75 76  Resp: 17 16 20 18   Temp: 98.6 F (37 C) 98 F (36.7 C) (!) 97.5 F (36.4 C)   TempSrc: Oral Oral Oral   SpO2: 95% 98% 99%   Weight:        PHYSICAL EXAM :Obese middle-aged African-American male not in distress. . Afebrile. Head is nontraumatic. Neck is supple without bruit.    Cardiac exam no murmur or gallop. Lungs are clear to auscultation. Distal pulses are well felt.  Neurological Exam :  Awake alert expressive greater than receptive aphasia. Speaks a few words and occasional short sentences.  Follows one and a few two-step commands. Able to follow gaze in all directions but does have left gaze preference. Able to look to the right past midline. Blinks to threat on the left but not on the right. Right lower facial weakness. Tongue midline. Motor system exam shows dense right hemiplegia with withdrawal in the right lower extremity and none in the right upper extremity. Has spontaneous antigravity movements on the left side. Decreased response to pain. Now on the right compared to the left. Gait not tested   ASSESSMENT/PLAN [name] is a 58 y.o. male with history seizures, hypertension and hyperlipidemia presenting with right-sided weakness, leftward gaze.   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 ICH etiology unclear, BP not really high on admission. Given pt so awake with a large hemorrhage, concerned for possible underlying mass lesion with hemorrhage  Code Stroke CT head large L BG ICH w/ edema and mass effect L lat ventricle w/ 39mm L to R shift. trace ICH R lateral ventricle. R lat ventriculometry.   CTA head patent  CTA neck patent. Mild spinal  spondylosis  F/u CT slight interval contraction large L BG ICH 110cc->106cc. IVH slightly increased. Similar mass effect w/ 43mm L to R shift w/ increased dilatation R temp horn R lat ventricle ? trapping  MRI w/w/o large left basal ganglia hemorrhage. No abnormal enhancing lesion or tumor  2D Echo  Normal ejection fraction 60-65 percent. No wall motion abnormalities.  LDL 118 mg percent  HgbA1c 6.4  SCDs for VTE prophylaxis  No antithrombotic prior to admission, now on No antithrombotic given hemorrhage  Therapy recommendations:  CLR. No support at home. Plan SNF.  Disposition:  Pending. Await SNF bed. Medically ready for d/c.  Financials unknown SW clarifying. Will be difficult to place if no insurance.   Cerebral Edema Induced Hypernatremia, resolved  Treated with 3% protocol in ICU  Na 139  Hypertension  Stable 130-140s  Treated with Cleviprex in ICU  Home meds: coreg, avapro, imdur  Continue to monitor  Restarted avapro 2/19  BP 130ish since MN  Monitor BP for resumption of other meds  SBP goal < 180  Hyperlipidemia  Home meds:  zocor 20  LDL 118 mg percent, goal < 70  Hold statin at present given ICH  Seizures  On Keppra PTA. Resumed.  EEG focal left hemispheric dysfunction consistent with patient's known ICH. No seizures.  Other Stroke Risk Factors  Obesity, There is no height or weight on file to calculate BMI., recommend weight loss, diet  and exercise as appropriate   Other Active Problems  Thrombocytopenia PLT 131  Hospital day # 8   . Await transfer to skilled nursing facility when bed available.discussed with sister at the bedside. Delia Heady, MD Medical Director [facility] Pager: [phone] 07/25/2018 1:55 PM    To contact Stroke Continuity provider, please refer to [email or web address]. After hours, contact General Neurology

## 2018-07-25 NOTE — Progress Notes (Signed)
Physical Therapy Treatment Patient Details Name: Jamie Stewart MRN: [id number] DOB: 08/21/1955 Today's Date: 07/25/2018    History of Present Illness Pt is a 58 y/o male with no documented PMH, who presented to the ED with unresponsiveness and left gaze. CT revealed a large hematoma centered within the left basal ganglia measuring up to 8.8 cm, 110 cc. Associated edema and mass effect effaces the left lateral ventricle, trace volume of hemorrhage within the occipital horn of right lateral ventricle.    PT Comments    Patient progressing slowly towards PT goals. Tolerated standing bouts with assist of 2 in stedy and worked on finding midline and upright posture. Pt with heavy right lateral lean in sitting and standing but able to initiate lateral flexors and get to midline with visual and demonstrational cues, but not able to sustain. Discharge recommendation updated to SNF as pt has no family support at home. Will follow.   Follow Up Recommendations  SNF;Supervision/Assistance - 24 hour     Equipment Recommendations  Other (comment)(defer to SNF)    Recommendations for Other Services       Precautions / Restrictions Precautions Precautions: Fall Restrictions Weight Bearing Restrictions: No    Mobility  Bed Mobility Overal bed mobility: Needs Assistance Bed Mobility: Rolling;Sidelying to Sit Rolling: Max assist Sidelying to sit: Max assist;HOB elevated       General bed mobility comments: Step by step cues to reach for rail and push through LLE to scoot bottom; assist with RLE to get to EOB and to elevate trunk,  Transfers Overall transfer level: Needs assistance Equipment used: 2 person hand held assist Transfers: Sit to/from Stand Sit to Stand: Mod assist;+2 physical assistance;+2 safety/equipment Stand pivot transfers: Total assist       General transfer comment: Assist of 2 to power to standing with pt pulling up on sara stedy and therapist supporting R side trunk.  Heavy right lateral lean. Max cues for upright and midline posture. Stood from EOB to [place] stedy and from stedy x2. Transferred to chair via stedy.  Ambulation/Gait             General Gait Details: Unable to progress gait training at this time.    Stairs             Wheelchair Mobility    Modified Rankin (Stroke Patients Only) Modified Rankin (Stroke Patients Only) Pre-Morbid Rankin Score: No symptoms Modified Rankin: Severe disability     Balance Overall balance assessment: Needs assistance Sitting-balance support: Feet supported;Single extremity supported Sitting balance-Leahy Scale: Poor Sitting balance - Comments: requires UE support to maintain static sitting balance with bil feet placed on floor; heavy right lateral lean when fatigued or with no UE support. Worked on finding midline and upright posture, able to activate lateral trunk flexors but not sustain.  Postural control: Posterior lean;Right lateral lean Standing balance support: During functional activity Standing balance-Leahy Scale: Zero Standing balance comment: +2 required for standing balance; worked on weight shifting and finding midline due to heavy right lateral lean. Able to find midline with visual and demonstrationsl cues.                              Cognition Arousal/Alertness: Awake/alert Behavior During Therapy: Flat affect Overall Cognitive Status: Difficult to assess  General Comments: Attempted some verbalizations however not making any sense; head nods and yes/no appropriate <50% of the time.       Exercises      General Comments General comments (skin integrity, edema, etc.): HR up to 115 bpm.      Pertinent Vitals/Pain Pain Assessment: Faces Faces Pain Scale: No hurt    Home Living                      Prior Function            PT Goals (current goals can now be found in the care plan section) Progress  towards PT goals: Progressing toward goals    Frequency    Min 4X/week      PT Plan Discharge plan needs to be updated    Co-evaluation              AM-PAC PT "6 Clicks" Mobility   Outcome Measure  Help needed turning from your back to your side while in a flat bed without using bedrails?: A Lot Help needed moving from lying on your back to sitting on the side of a flat bed without using bedrails?: A Lot Help needed moving to and from a bed to a chair (including a wheelchair)?: Total Help needed standing up from a chair using your arms (e.g., wheelchair or bedside chair)?: Total Help needed to walk in hospital room?: Total Help needed climbing 3-5 steps with a railing? : Total 6 Click Score: 8    End of Session Equipment Utilized During Treatment: Gait belt Activity Tolerance: Patient tolerated treatment well Patient left: in chair;with call bell/phone within reach;with chair alarm set Nurse Communication: Mobility status PT Visit Diagnosis: Hemiplegia and hemiparesis;Other symptoms and signs involving the nervous system (R29.898) Hemiplegia - Right/Left: Right Hemiplegia - dominant/non-dominant: Dominant Hemiplegia - caused by: Nontraumatic intracerebral hemorrhage     Time: 0712-1975 PT Time Calculation (min) (ACUTE ONLY): 23 min  Charges:  $Therapeutic Activity: 8-22 mins $Neuromuscular Re-education: 8-22 mins                     Mylo Red, PT, DPT Acute Rehabilitation Services Pager [phone] Office [phone]       Blake Divine A Lanier Ensign 07/25/2018, 4:02 PM

## 2018-07-25 NOTE — Progress Notes (Addendum)
CSW called and spoke with the patient's sister, Isabelle Hedding. Her number is [phone]. She has not called financial counseling yet to complete her brother's medicaid application.   CSW provided her with the hospital's main number to call and ask the operator to connect her to the office. SNF placement cannot continue with out the application being completed.   CSW will continue to follow.   Drucilla Schmidt, MSW, LCSW-A Clinical Social Worker [facility]

## 2018-07-25 NOTE — Clinical Social Work Note (Signed)
Clinical Social Work Assessment  Patient Details  Name: Jamie Stewart MRN: [id number] Date of Birth: 08/21/1955  Date of referral:  07/25/18               Reason for consult:  Discharge Planning                Permission sought to share information with:  Case Manager Permission granted to share information::  Yes, Verbal Permission Granted  Name::        Agency::  [facility]  Relationship::  Jamie Stewart, Sister  Contact Information:     Housing/Transportation Living arrangements for the past 2 months:  Apartment Source of Information:  Patient Patient Interpreter Needed:  None Criminal Activity/Legal Involvement Pertinent to Current Situation/Hospitalization:  No - Comment as needed Significant Relationships:  Siblings, Other(Comment)(Room mate) Lives with:  Roommate Do you feel safe going back to the place where you live?  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 Patient lives with roommate. Patient does not have insurance and family has not completed medicaid application for the patient.     Social Worker assessment / plan:    CSW called and spoke with the patients sister, Jamie Stewart. She is down from [place] helping take care of her brothers affairs. CSW spoke with the patients sister about skilled nursing placement. She is in agreement and wants her brother to go to a skilled nursing facility so that he is able to receive additional support. She does not want her brother to return home without having some sort of rehab. Patient currently does not have medicaid. CSW followed up with the patients sister about completing his medicaid application with financial counseling. CSW provided the number to financial counseling. CSW pressed that it was important to get it done this afternoon so that it could be at least started. Patient has already been approved for an LOG.   CSW  also followed up with the patients sister about having his social security number. She stated that she will be going to his home tonight and will have it by tomorrow. CSW will plan on meeting with the family to get the social security number.   Employment status:    Insurance information:  Self Pay (Medicaid Pending) PT Recommendations:  Skilled Nursing Facility Information / Referral to community resources:  Skilled Nursing Facility  Patient/Family's Response to care:  Patient has a good support system that is understanding of his current diagnosis.   Patient/Family's Understanding of and Emotional Response to Diagnosis, Current Treatment, and Prognosis:  Patients sister is understanding and resiliant about making sure that her brother has the best care and outcomes.   Emotional Assessment Appearance:  Appears stated age Attitude/Demeanor/Rapport:  Unable to Assess Affect (typically observed):  Unable to Assess Orientation:  Oriented to Self, Oriented to  Time, Oriented to Place, Oriented to Situation Alcohol / Substance use:  Not Applicable Psych involvement (Current and /or in the community):  No (Comment)  Discharge Needs  Concerns to be addressed:  Discharge Planning Concerns Readmission within the last 30 days:  No Current discharge risk:    Barriers to Discharge:  No SNF bed, Inadequate or no insurance   Jamie Stewart B Lynsee Wands, LCSWA 07/25/2018, 3:18 PM

## 2018-07-26 DIAGNOSIS — E785 Hyperlipidemia, unspecified: Secondary | ICD-10-CM

## 2018-07-26 DIAGNOSIS — I10 Essential (primary) hypertension: Secondary | ICD-10-CM

## 2018-07-26 DIAGNOSIS — E871 Hypo-osmolality and hyponatremia: Secondary | ICD-10-CM

## 2018-07-26 LAB — BASIC METABOLIC PANEL
Anion gap: 9 (ref 5–15)
BUN: 15 mg/dL (ref 6–20)
CO2: 25 mmol/L (ref 22–32)
Calcium: 8.9 mg/dL (ref 8.9–10.3)
Chloride: 99 mmol/L (ref 98–111)
Creatinine, Ser: 0.97 mg/dL (ref 0.61–1.24)
GFR calc Af Amer: >60 mL/min (ref >60)
Glucose, Bld: 118 mg/dL — ABNORMAL HIGH (ref 70–99)
Potassium: 3.7 mmol/L (ref 3.5–5.1)
Sodium: 133 mmol/L — ABNORMAL LOW (ref 135–145)

## 2018-07-26 LAB — CBC
HCT: 36.9 % — ABNORMAL LOW (ref 39.0–52.0)
Hemoglobin: 12.3 g/dL — ABNORMAL LOW (ref 13.0–17.0)
MCH: 32.2 pg (ref 26.0–34.0)
MCHC: 33.3 g/dL (ref 30.0–36.0)
MCV: 96.6 fL (ref 80.0–100.0)
Platelets: 211 10*3/uL (ref 150–400)
RBC: 3.82 MIL/uL — AB (ref 4.22–5.81)
RDW: 14.1 % (ref 11.5–15.5)
WBC: 6.2 10*3/uL (ref 4.0–10.5)
nRBC: 0 % (ref 0.0–0.2)

## 2018-07-26 MED ORDER — HEPARIN SODIUM (PORCINE) 5000 UNIT/ML IJ SOLN
5000.0000 [IU] | Freq: Three times a day (TID) | INTRAMUSCULAR | Status: DC
  Administered 2018-07-26 – 2018-08-01 (×16): 5000 [IU] via SUBCUTANEOUS
  Filled 2018-07-26 (×16): qty 1

## 2018-07-26 NOTE — Progress Notes (Signed)
CSW called and spoke with the patients sister, Barndon Tenbusch. She provided the patient's social security number ([phone]). She stated that she spoke with Bradly Bienenstock in financial counseling and they did not complete the application. FC needed the patients social. CSW informed pt sister that she or pt brother would need to contact the office on Monday so that the application could be completed. Pt sister is returning to [place] on Monday and pt brother, [name] will help with medical decisions.   CSW will continue to follow.   Drucilla Schmidt, MSW, LCSW-A Clinical Social Worker [facility]

## 2018-07-26 NOTE — Progress Notes (Signed)
STROKE TEAM PROGRESS NOTE   INTERVAL HISTORY His sister from [place] is at the bedside. Pt lying in bed, still has aphasia and right hemiplegia. Pending SNF  Vitals:   07/25/18 2348 07/26/18 0422 07/26/18 0751 07/26/18 1149  BP: (!) 136/93 (!) 130/93 (!) 128/91 124/90  Pulse: 91 85 87 95  Resp: Temp: 98.6 F (37 C) 98.4 F (36.9 C) 98.8 F (37.1 C)   TempSrc: Oral Oral Oral   SpO2: 91% 95% 94% 100%  Weight:       Images  Ct Angio Head W Or Wo Contrast  Result Date: 07/17/2018 CLINICAL DATA:  58 y/o M; right-sided weakness, left-sided gaze, nonverbal, code stroke. EXAM: CT HEAD WITHOUT CONTRAST CT ANGIOGRAPHY HEAD AND NECK TECHNIQUE: CT of the head was performed from skull base to vertex without intravenous contrast administration. Multidetector CT imaging of the head and neck was performed using the standard protocol during bolus administration of intravenous contrast. Multiplanar CT image reconstructions and MIPs were obtained to evaluate the vascular anatomy. Carotid stenosis measurements (when applicable) are obtained utilizing NASCET criteria, using the distal internal carotid diameter as the denominator. CONTRAST:  75mL ISOVUE-370 IOPAMIDOL (ISOVUE-370) INJECTION 76% COMPARISON:  None. FINDINGS: CT HEAD FINDINGS Brain: Large brain parenchymal hemorrhage centered within left basal ganglia measuring 8.8 x 4.5 x 5.2 cm (volume = 110 cm^3)(AP x ML x CC series 3, image 20 and series 5, image 43). Trace volume of hemorrhage is present within the occipital horn of right lateral ventricle. Edema and mass effect associated with the hematoma effaces the left lateral ventricle and results in 6 mm of left-to-right midline shift. The right lateral ventricle is mildly enlarged which may be due to parenchymal volume loss and ex vacuo dilatation from chronic infarction or possibly early ventricular entrapment, attention at follow-up recommended. There is a small chronic infarction within the  right inferolateral frontal lobe. There are moderate chronic microvascular ischemic changes of white matter and volume loss of the brain. Vascular: As below. Skull: Normal. Negative for fracture or focal lesion. Sinuses: Mild mucosal thickening within the paranasal sinuses. No sinus fluid level. Normal aeration of mastoid air cells. Orbits are unremarkable. Orbits: No acute finding. Review of the MIP images confirms the above findings CTA NECK FINDINGS Aortic arch: Four-vessel variant branching. Imaged portion shows no evidence of aneurysm or dissection. No significant stenosis of the major arch vessel origins. Right carotid system: No evidence of dissection, stenosis (50% or greater) or occlusion. Mild non stenotic calcific atherosclerosis of carotid bifurcation. Left carotid system: No evidence of dissection, stenosis (50% or greater) or occlusion. Vertebral arteries: Codominant. No evidence of dissection, stenosis (50% or greater) or occlusion. Skeleton: Mild spondylosis of the cervical spine. No high-grade bony spinal canal stenosis. No acute osseous abnormality is evident Other neck: Negative. Upper chest: Negative. Review of the MIP images confirms the above findings CTA HEAD FINDINGS Anterior circulation: No significant stenosis, proximal occlusion, aneurysm, or vascular malformation. Posterior circulation: No significant stenosis, proximal occlusion, aneurysm, or vascular malformation. Venous sinuses: As permitted by contrast timing, patent. Anatomic variants: None significant. Delayed phase: No abnormal intracranial enhancement. Review of the MIP images confirms the above findings IMPRESSION: CT head: 1. Large hematoma centered within the left basal ganglia measuring up to 8.8 cm, 110 cc. Associated edema and mass effect effaces the left lateral ventricle and results in 6 mm of left-to-right midline shift. 2. Trace volume of hemorrhage within the occipital horn of right lateral  ventricle. Right lateral  ventriculomegaly may be due to ex vacuo dilatation from brain parenchymal volume loss and chronic right inferolateral frontal lobe infarction or possibly early entrapment, attention at follow-up recommended. CTA neck: 1. Patent carotid and vertebral arteries. No dissection, aneurysm, or hemodynamically significant stenosis utilizing NASCET criteria. 2. Mild spondylosis of the cervical spine. CTA head: 1. Patent anterior and posterior intracranial circulation. No large vessel occlusion, aneurysm, vascular malformation, or significant stenosis identified. These results were called by telephone at the time of interpretation on 07/17/2018 at 7:50 pm to Dr. Milon Dikes , who verbally acknowledged these results. Electronically Signed   By: Mitzi Hansen M.D.   On: 07/17/2018 20:16   Ct Head Wo Contrast  Result Date: 07/18/2018 CLINICAL DATA:  Intraparenchymal hemorrhage EXAM: CT HEAD WITHOUT CONTRAST TECHNIQUE: Contiguous axial images were obtained from the base of the skull through the vertex without intravenous contrast. COMPARISON:  None. FINDINGS: Brain: Large LEFT cerebral hemorrhage centered in the basal ganglia and deep white matter of the frontal lobe measured at same orientation at 8.2 x 4.3 by 5.3 cm (volume = 98 cm^3)compared to 8.9 by 4.6 x 5.2 cm (volume = 110 cm^3). Visually large hematoma is very similar. Peripheral vasogenic edema similar. Persistent LEFT to RIGHT midline shift measuring 6 mm (image 13/13) compared to 5 mm remeasured. There is compression of the LEFT lateral ventricle. Mild prominence of the atria of the LEFT ventricle similar prior. Similar appearance to the RIGHT ventricle. Small amount of intra ventricular blood layers in the atria of the RIGHT ventricle. Basilar cisterns are patent. Vascular: No hyperdense vessel or unexpected calcification. Skull: Normal. Negative for fracture or focal lesion. Sinuses/Orbits: No acute finding. Other: None IMPRESSION: 1. No increase in  volume of large acute intraparenchymal LEFT cerebral hemorrhage with potential mild contraction. 2. No significant change in mass effect with mild (44mm) LEFT to RIGHT midline shift and compression of the LEFT lateral ventricle. 3. No change in small volume intraventricular hemorrhage. 4. Basal cisterns are patent. Electronically Signed   By: Genevive Bi M.D.   On: 07/18/2018 12:50   Ct Head Wo Contrast  Result Date: 07/18/2018 CLINICAL DATA:  Follow-up examination for intracranial hemorrhage. EXAM: CT HEAD WITHOUT CONTRAST TECHNIQUE: Contiguous axial images were obtained from the base of the skull through the vertex without intravenous contrast. COMPARISON:  Prior CT from 07/17/2018. FINDINGS: Brain: Large intraparenchymal hematoma centered at the left basal ganglia is perhaps slightly contracted from previous measuring approximately 106 cc, previously 110 cc). Surrounding vasogenic edema with regional mass effect relatively similar. Adjacent left lateral ventricle largely effaced with up to 8 mm of left-to-right midline shift. Evidence for intraventricular extension with blood in the lateral ventricles as well as the cerebral aqueduct, slightly increased from previous. Slight asymmetric dilatation of the temporal horn of the right lateral ventricle has slightly worsened, possibly reflecting developing ventricular trapping. No other new intracranial hemorrhage. No extra-axial fluid collection. No acute large vessel territory infarct. Vascular: No new hyperdense vessel. Calcified atherosclerosis at the skull base. Skull: Scalp soft tissues and calvarium demonstrate no acute finding. Sinuses/Orbits: Globes and orbital soft tissues within normal limits. Mild mucosal thickening within the paranasal sinuses. Mastoid air cells remain clear. Other: None. IMPRESSION: 1. Slight interval contraction of large hematoma centered at the left basal ganglia, now measuring 106 cc, previously 110 cc. Associated  intraventricular extension with small volume intraventricular blood, slightly increased from previous. 2. Similar regional mass effect with up to 8 mm left-to-right shift.  Slightly worsened dilatation of the temporal horn of the right lateral ventricle, which could reflect developing ventricular trapping. Attention at follow-up. 3. No other new acute intracranial abnormality. Electronically Signed   By: Rise Mu M.D.   On: 07/18/2018 03:09   Ct Angio Neck W Or Wo Contrast  Result Date: 07/17/2018 CLINICAL DATA:  58 y/o M; right-sided weakness, left-sided gaze, nonverbal, code stroke. EXAM: CT HEAD WITHOUT CONTRAST CT ANGIOGRAPHY HEAD AND NECK TECHNIQUE: CT of the head was performed from skull base to vertex without intravenous contrast administration. Multidetector CT imaging of the head and neck was performed using the standard protocol during bolus administration of intravenous contrast. Multiplanar CT image reconstructions and MIPs were obtained to evaluate the vascular anatomy. Carotid stenosis measurements (when applicable) are obtained utilizing NASCET criteria, using the distal internal carotid diameter as the denominator. CONTRAST:  75mL ISOVUE-370 IOPAMIDOL (ISOVUE-370) INJECTION 76% COMPARISON:  None. FINDINGS: CT HEAD FINDINGS Brain: Large brain parenchymal hemorrhage centered within left basal ganglia measuring 8.8 x 4.5 x 5.2 cm (volume = 110 cm^3)(AP x ML x CC series 3, image 20 and series 5, image 43). Trace volume of hemorrhage is present within the occipital horn of right lateral ventricle. Edema and mass effect associated with the hematoma effaces the left lateral ventricle and results in 6 mm of left-to-right midline shift. The right lateral ventricle is mildly enlarged which may be due to parenchymal volume loss and ex vacuo dilatation from chronic infarction or possibly early ventricular entrapment, attention at follow-up recommended. There is a small chronic infarction within  the right inferolateral frontal lobe. There are moderate chronic microvascular ischemic changes of white matter and volume loss of the brain. Vascular: As below. Skull: Normal. Negative for fracture or focal lesion. Sinuses: Mild mucosal thickening within the paranasal sinuses. No sinus fluid level. Normal aeration of mastoid air cells. Orbits are unremarkable. Orbits: No acute finding. Review of the MIP images confirms the above findings CTA NECK FINDINGS Aortic arch: Four-vessel variant branching. Imaged portion shows no evidence of aneurysm or dissection. No significant stenosis of the major arch vessel origins. Right carotid system: No evidence of dissection, stenosis (50% or greater) or occlusion. Mild non stenotic calcific atherosclerosis of carotid bifurcation. Left carotid system: No evidence of dissection, stenosis (50% or greater) or occlusion. Vertebral arteries: Codominant. No evidence of dissection, stenosis (50% or greater) or occlusion. Skeleton: Mild spondylosis of the cervical spine. No high-grade bony spinal canal stenosis. No acute osseous abnormality is evident Other neck: Negative. Upper chest: Negative. Review of the MIP images confirms the above findings CTA HEAD FINDINGS Anterior circulation: No significant stenosis, proximal occlusion, aneurysm, or vascular malformation. Posterior circulation: No significant stenosis, proximal occlusion, aneurysm, or vascular malformation. Venous sinuses: As permitted by contrast timing, patent. Anatomic variants: None significant. Delayed phase: No abnormal intracranial enhancement. Review of the MIP images confirms the above findings IMPRESSION: CT head: 1. Large hematoma centered within the left basal ganglia measuring up to 8.8 cm, 110 cc. Associated edema and mass effect effaces the left lateral ventricle and results in 6 mm of left-to-right midline shift. 2. Trace volume of hemorrhage within the occipital horn of right lateral ventricle. Right lateral  ventriculomegaly may be due to ex vacuo dilatation from brain parenchymal volume loss and chronic right inferolateral frontal lobe infarction or possibly early entrapment, attention at follow-up recommended. CTA neck: 1. Patent carotid and vertebral arteries. No dissection, aneurysm, or hemodynamically significant stenosis utilizing NASCET criteria. 2. Mild spondylosis of the  cervical spine. CTA head: 1. Patent anterior and posterior intracranial circulation. No large vessel occlusion, aneurysm, vascular malformation, or significant stenosis identified. These results were called by telephone at the time of interpretation on 07/17/2018 at 7:50 pm to Dr. Milon Dikes , who verbally acknowledged these results. Electronically Signed   By: Mitzi Hansen M.D.   On: 07/17/2018 20:16   Mr Laqueta Jean UJ Contrast  Result Date: 07/18/2018 CLINICAL DATA:  Intracranial hemorrhage.  Altered mental status. EXAM: MRI HEAD WITHOUT AND WITH CONTRAST TECHNIQUE: Multiplanar, multiecho pulse sequences of the brain and surrounding structures were obtained without and with intravenous contrast. CONTRAST:  9 mL Gadavist COMPARISON:  Head CT 07/18/2018 FINDINGS: BRAIN: Massive intraparenchymal hematoma centered in the left basal ganglia is unchanged in size. There is intraventricular extension into both lateral ventricles. Rightward midline shift measures 5 mm, unchanged. The size and configuration of the ventricles are unchanged. There is edema extending into the subcortical white matter of the left frontal and parietal lobes. Early confluent hyperintense T2-weighted signal of the periventricular and deep white matter, most commonly due to chronic ischemic microangiopathy. No abnormal contrast enhancement. VASCULAR: Major intracranial arterial and venous sinus flow voids are normal. SKULL AND UPPER CERVICAL SPINE: Calvarial bone marrow signal is normal. There is no skull base mass. Visualized upper cervical spine and soft  tissues are normal. SINUSES/ORBITS: No fluid levels or advanced mucosal thickening. No mastoid or middle ear effusion. The orbits are normal. IMPRESSION: 1. Unchanged size of large intraparenchymal hematoma centered in the left basal ganglia, most consistent with hypertensive hemorrhage. 2. No abnormal contrast enhancement or other evidence of underlying lesion. 3. Unchanged 5 mm rightward midline shift with intraventricular extension of hemorrhage. Electronically Signed   By: Deatra Robinson M.D.   On: 07/18/2018 16:21   Ct Head Code Stroke Wo Contrast  Result Date: 07/17/2018 CLINICAL DATA:  58 y/o M; right-sided weakness, left-sided gaze, nonverbal, code stroke. EXAM: CT HEAD WITHOUT CONTRAST CT ANGIOGRAPHY HEAD AND NECK TECHNIQUE: CT of the head was performed from skull base to vertex without intravenous contrast administration. Multidetector CT imaging of the head and neck was performed using the standard protocol during bolus administration of intravenous contrast. Multiplanar CT image reconstructions and MIPs were obtained to evaluate the vascular anatomy. Carotid stenosis measurements (when applicable) are obtained utilizing NASCET criteria, using the distal internal carotid diameter as the denominator. CONTRAST:  75mL ISOVUE-370 IOPAMIDOL (ISOVUE-370) INJECTION 76% COMPARISON:  None. FINDINGS: CT HEAD FINDINGS Brain: Large brain parenchymal hemorrhage centered within left basal ganglia measuring 8.8 x 4.5 x 5.2 cm (volume = 110 cm^3)(AP x ML x CC series 3, image 20 and series 5, image 43). Trace volume of hemorrhage is present within the occipital horn of right lateral ventricle. Edema and mass effect associated with the hematoma effaces the left lateral ventricle and results in 6 mm of left-to-right midline shift. The right lateral ventricle is mildly enlarged which may be due to parenchymal volume loss and ex vacuo dilatation from chronic infarction or possibly early ventricular entrapment, attention at  follow-up recommended. 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 There are moderate chronic microvascular ischemic changes of white matter and volume loss of the brain. Vascular: As below. Skull: Normal. Negative for fracture or focal lesion. Sinuses: Mild mucosal thickening within the paranasal sinuses. No sinus fluid level. Normal aeration of mastoid air cells. Orbits are unremarkable. Orbits: No acute finding. Review of the MIP images confirms the above findings  CTA NECK FINDINGS Aortic arch: Four-vessel variant branching. Imaged portion shows no evidence of aneurysm or dissection. No significant stenosis of the major arch vessel origins. Right carotid system: No evidence of dissection, stenosis (50% or greater) or occlusion. Mild non stenotic calcific atherosclerosis of carotid bifurcation. Left carotid system: No evidence of dissection, stenosis (50% or greater) or occlusion. Vertebral arteries: Codominant. No evidence of dissection, stenosis (50% or greater) or occlusion. Skeleton: Mild spondylosis of the cervical spine. No high-grade bony spinal canal stenosis. No acute osseous abnormality is evident Other neck: Negative. Upper chest: Negative. Review of the MIP images confirms the above findings CTA HEAD FINDINGS Anterior circulation: No significant stenosis, proximal occlusion, aneurysm, or vascular malformation. Posterior circulation: No significant stenosis, proximal occlusion, aneurysm, or vascular malformation. Venous sinuses: As permitted by contrast timing, patent. Anatomic variants: None significant. Delayed phase: No abnormal intracranial enhancement. Review of the MIP images confirms the above findings IMPRESSION: CT head: 1. Large hematoma centered within the left basal ganglia measuring up to 8.8 cm, 110 cc. Associated edema and mass effect effaces the left lateral ventricle and results in 6 mm of left-to-right midline shift. 2. Trace volume of hemorrhage  within the occipital horn of right lateral ventricle. Right lateral ventriculomegaly may be due to ex vacuo dilatation from brain parenchymal volume loss and chronic right inferolateral frontal lobe infarction or possibly early entrapment, attention at follow-up recommended. CTA neck: 1. Patent carotid and vertebral arteries. No dissection, aneurysm, or hemodynamically significant stenosis utilizing NASCET criteria. 2. Mild spondylosis of the cervical spine. CTA head: 1. Patent anterior and posterior intracranial circulation. No large vessel occlusion, aneurysm, vascular malformation, or significant stenosis identified. These results were called by telephone at the time of interpretation on 07/17/2018 at 7:50 pm to Dr. Milon Dikes , who verbally acknowledged these results. Electronically Signed   By: Mitzi Hansen M.D.   On: 07/17/2018 20:16     PHYSICAL EXAM: Obese middle-aged African-American male not in distress. . Afebrile. Head is nontraumatic. Neck is supple without bruit.    Cardiac exam no murmur or gallop. Lungs are clear to auscultation. Distal pulses are well felt.  Neurological Exam :  Awake alert, expressive greater than receptive aphasia. Speaks a few words and occasional short sentences.  Follows most simple commands. Name 1/3, able to repeat 3 word sentences, but dysarthria. Able to follow gaze in all directions but does have left gaze preference. Able to look to the right past midline. Blinks to threat on the left but not on the right. Right lower facial weakness. Tongue midline. Motor system exam shows dense right hemiplegia with withdrawal in the right lower extremity and none in the right upper extremity on pain stimulation. Has spontaneous antigravity movements on the left side. Decreased response to pain on the right compared to the left. Gait not tested.   ASSESSMENT/PLAN [name] is a 58 y.o. male with history seizures, hypertension and hyperlipidemia presenting  with right-sided weakness, leftward gaze.   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 Etiology likely hypertensive given location and hx of HTN on meds at home  Serial CT head large L BG ICH w/ edema and mass effect L lat ventricle with L to R shift. trace ICH R lateral ventricle.   CTA head and neck unremarkable  MRI w/w/o large left basal ganglia hemorrhage. No abnormal enhancing lesion or tumor  2D Echo  EF 60-65%  LDL 118  HgbA1c 6.4  Heparin subq for VTE prophylaxis  No antithrombotic prior to admission, now on No antithrombotic given hemorrhage  Therapy recommendations:  CLR. No support at home. Plan SNF.  Disposition:  Pending. Await SNF bed. Medically ready for d/c.  Financials unknown SW clarifying. Will be difficult to place if no insurance.   Cerebral Edema Induced Hypernatremia->hyponatremia   Treated with 3% protocol in ICU  CT serial showed stable mass effect  Na 139->133  BMP monitoring  Hypertension  Stable 130-140s  Treated with Cleviprex in ICU  Home meds: coreg, avapro, imdur  Restarted avapro 2/19  Monitor BP  SBP goal < 160  Hyperlipidemia  Home meds:  zocor 20  LDL 118, goal < 70  Hold statin at present given ICH  Resume on discharge  Seizure history   On Keppra PTA.   Continue keppra 500mg  bid   EEG - focal left hemispheric dysfunction consistent with patient's known ICH. No seizures.  Other Stroke Risk Factors    Other Active Problems  Thrombocytopenia PLT 131->143->211  Hyponatremia N [id number]  Hospital day # 9  Marvel Plan, MD PhD Stroke Neurology 07/26/2018 4:44 PM    To contact Stroke Continuity provider, please refer to [email or web address]. After hours, contact General Neurology

## 2018-07-26 NOTE — [place] FL2 (Addendum)
[place] MEDICAID FL2 LEVEL OF CARE SCREENING TOOL     IDENTIFICATION  Patient Name: Jamie Stewart Birthdate: 08/21/1955 Sex: male Admission Date (Current Location): 07/17/2018  [facility] and [place] Number:      Facility and Address:  [facility] [place], [address], [place], [place] [address]      Provider Number: [id number]  Attending Physician Name and Address:  Micki Riley, MD  Relative Name and Phone Number:  Josemiguel, Settle, [phone] & Maurene Capes, Sister, [phone]    Current Level of Care: Hospital Recommended Level of Care: Skilled Nursing Facility Prior Approval Number:    Date Approved/Denied:  07/26/2018 PASRR Number:   [id number]  Discharge Plan: SNF    Current Diagnoses: Patient Active Problem List   Diagnosis Date Noted  . 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 Brain herniation (HCC) 07/18/2018  . ICH (intracerebral hemorrhage) (HCC) 07/17/2018    Orientation RESPIRATION BLADDER Height & Weight     Self, Time, Situation, Place(Has asphasia)  Normal Incontinent, External catheter Weight: 207 lb 14.3 oz (94.3 kg) Height:     BEHAVIORAL SYMPTOMS/MOOD NEUROLOGICAL BOWEL NUTRITION STATUS      Incontinent Diet(Dys 1, nectar thick liquids)  AMBULATORY STATUS COMMUNICATION OF NEEDS Skin   Limited Assist Verbally Normal, Bruising(On groin)                       Personal Care Assistance Level of Assistance  Bathing, Feeding, Total care, Dressing Bathing Assistance: Maximum assistance Feeding assistance: Independent Dressing Assistance: Maximum assistance Total Care Assistance: Maximum assistance   Functional Limitations Info  Sight, Hearing, Speech Sight Info: Adequate Hearing Info: Adequate Speech Info: Adequate    SPECIAL CARE FACTORS FREQUENCY  PT (By licensed PT), OT (By licensed OT), Speech therapy     PT Frequency: 5x/wk OT Frequency: 5x/wk     Speech Therapy Frequency: 3x/wk       Contractures Contractures Info: Not present    Additional Factors Info  Code Status, Allergies Code Status Info: Fulle Code Allergies Info: No known allergies           Current Medications (07/26/2018):  This is the current hospital active medication list Current Facility-Administered Medications  Medication Dose Route Frequency Provider Last Rate Last Dose  . 0.9 %  sodium chloride infusion   Intravenous PRN Micki Riley, MD 10 mL/hr at 07/20/18 2101 250 mL at 07/20/18 2101  . acetaminophen (TYLENOL) tablet 650 mg  650 mg Oral Q4H PRN Micki Riley, MD   650 mg at 07/23/18 2301   Or  . acetaminophen (TYLENOL) solution 650 mg  650 mg Per Tube Q4H PRN Micki Riley, MD   650 mg at 07/22/18 1620   Or  . acetaminophen (TYLENOL) suppository 650 mg  650 mg Rectal Q4H PRN Micki Riley, MD      . chlorhexidine (PERIDEX) 0.12 % solution 15 mL  15 mL Mouth Rinse BID Micki Riley, MD   15 mL at 07/26/18 1014  . irbesartan (AVAPRO) tablet 300 mg  300 mg Oral Daily [name] L, NP   300 mg at 07/26/18 1014  . labetalol (NORMODYNE,TRANDATE) injection 20 mg  20 mg Intravenous Q2H PRN Micki Riley, MD      . levETIRAcetam (KEPPRA) 100 MG/ML solution 500 mg  500 mg Oral BID Micki Riley, MD   500 mg at 07/26/18 1014  . MEDLINE mouth rinse  15 mL  Mouth Rinse q12n4p Micki Riley, MD   15 mL at 07/26/18 1125  . pantoprazole sodium (PROTONIX) 40 mg/20 mL oral suspension 40 mg  40 mg Oral QHS Micki Riley, MD   40 mg at 07/25/18 2123  . RESOURCE THICKENUP CLEAR   Oral PRN Micki Riley, MD      . senna-docusate (Senokot-S) tablet 1 tablet  1 tablet Oral BID Micki Riley, MD   1 tablet at 07/26/18 1014     Discharge Medications: Please see discharge summary for a list of discharge medications.  Relevant Imaging Results:  Relevant Lab Results:   Additional Information  SSN: [id number]  Nada Boozer Pinion, LCSWA  I have personally obtained history,examined this  patient, reviewed notes, independently viewed imaging studies, participated in medical decision making and plan of care.ROS completed by me personally and pertinent positives fully documented  I have made any additions or clarifications directly to the above note. Agree with note above.   Delia Heady, MD Medical Director [facility] Pager: [phone] 07/27/2018 1:43 PM

## 2018-07-27 DIAGNOSIS — G40909 Epilepsy, unspecified, not intractable, without status epilepticus: Secondary | ICD-10-CM

## 2018-07-27 LAB — BASIC METABOLIC PANEL
Anion gap: 13 (ref 5–15)
BUN: 17 mg/dL (ref 6–20)
CO2: 22 mmol/L (ref 22–32)
Calcium: 9 mg/dL (ref 8.9–10.3)
Chloride: 97 mmol/L — ABNORMAL LOW (ref 98–111)
Creatinine, Ser: 1.04 mg/dL (ref 0.61–1.24)
GFR calc non Af Amer: >60 mL/min (ref >60)
Glucose, Bld: 108 mg/dL — ABNORMAL HIGH (ref 70–99)
Potassium: 3.7 mmol/L (ref 3.5–5.1)
Sodium: 132 mmol/L — ABNORMAL LOW (ref 135–145)

## 2018-07-27 LAB — CBC
HCT: 38.2 % — ABNORMAL LOW (ref 39.0–52.0)
Hemoglobin: 12.9 g/dL — ABNORMAL LOW (ref 13.0–17.0)
MCH: 32.7 pg (ref 26.0–34.0)
MCHC: 33.8 g/dL (ref 30.0–36.0)
MCV: 96.7 fL (ref 80.0–100.0)
Platelets: 240 10*3/uL (ref 150–400)
RBC: 3.95 MIL/uL — ABNORMAL LOW (ref 4.22–5.81)
RDW: 13.9 % (ref 11.5–15.5)
WBC: 6.4 10*3/uL (ref 4.0–10.5)
nRBC: 0 % (ref 0.0–0.2)

## 2018-07-27 NOTE — Progress Notes (Signed)
STROKE TEAM PROGRESS NOTE   INTERVAL HISTORY No family is at the bedside. Pt lying in bed, still has partial aphasia and right hemiplegia.  No acute event overnight.  Labs stable, still has mild hyponatremia.  Pending SNF  Vitals:   07/27/18 0333 07/27/18 0741 07/27/18 1206 07/27/18 1459  BP: (!) 127/91 (!) 124/91 128/87 (!) 134/93  Pulse: 79 90 93 78  Resp: Temp: 98.8 F (37.1 C) 98.9 F (37.2 C) 98.9 F (37.2 C)   TempSrc: Oral Oral Oral   SpO2: 96% 96% 94% 98%  Weight:       Images  Ct Angio Head W Or Wo Contrast  Result Date: 07/17/2018 CLINICAL DATA:  58 y/o M; right-sided weakness, left-sided gaze, nonverbal, code stroke. EXAM: CT HEAD WITHOUT CONTRAST CT ANGIOGRAPHY HEAD AND NECK TECHNIQUE: CT of the head was performed from skull base to vertex without intravenous contrast administration. Multidetector CT imaging of the head and neck was performed using the standard protocol during bolus administration of intravenous contrast. Multiplanar CT image reconstructions and MIPs were obtained to evaluate the vascular anatomy. Carotid stenosis measurements (when applicable) are obtained utilizing NASCET criteria, using the distal internal carotid diameter as the denominator. CONTRAST:  75mL ISOVUE-370 IOPAMIDOL (ISOVUE-370) INJECTION 76% COMPARISON:  None. FINDINGS: CT HEAD FINDINGS Brain: Large brain parenchymal hemorrhage centered within left basal ganglia measuring 8.8 x 4.5 x 5.2 cm (volume = 110 cm^3)(AP x ML x CC series 3, image 20 and series 5, image 43). Trace volume of hemorrhage is present within the occipital horn of right lateral ventricle. Edema and mass effect associated with the hematoma effaces the left lateral ventricle and results in 6 mm of left-to-right midline shift. The right lateral ventricle is mildly enlarged which may be due to parenchymal volume loss and ex vacuo dilatation from chronic infarction or possibly early ventricular entrapment, attention at  follow-up recommended. 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 There are moderate chronic microvascular ischemic changes of white matter and volume loss of the brain. Vascular: As below. Skull: Normal. Negative for fracture or focal lesion. Sinuses: Mild mucosal thickening within the paranasal sinuses. No sinus fluid level. Normal aeration of mastoid air cells. Orbits are unremarkable. Orbits: No acute finding. Review of the MIP images confirms the above findings CTA NECK FINDINGS Aortic arch: Four-vessel variant branching. Imaged portion shows no evidence of aneurysm or dissection. No significant stenosis of the major arch vessel origins. Right carotid system: No evidence of dissection, stenosis (50% or greater) or occlusion. Mild non stenotic calcific atherosclerosis of carotid bifurcation. Left carotid system: No evidence of dissection, stenosis (50% or greater) or occlusion. Vertebral arteries: Codominant. No evidence of dissection, stenosis (50% or greater) or occlusion. Skeleton: Mild spondylosis of the cervical spine. No high-grade bony spinal canal stenosis. No acute osseous abnormality is evident Other neck: Negative. Upper chest: Negative. Review of the MIP images confirms the above findings CTA HEAD FINDINGS Anterior circulation: No significant stenosis, proximal occlusion, aneurysm, or vascular malformation. Posterior circulation: No significant stenosis, proximal occlusion, aneurysm, or vascular malformation. Venous sinuses: As permitted by contrast timing, patent. Anatomic variants: None significant. Delayed phase: No abnormal intracranial enhancement. Review of the MIP images confirms the above findings IMPRESSION: CT head: 1. Large hematoma centered within the left basal ganglia measuring up to 8.8 cm, 110 cc. Associated edema and mass effect effaces the left lateral ventricle and results in 6 mm of left-to-right midline shift. 2.  Trace volume of hemorrhage  within the occipital horn of right lateral ventricle. Right lateral ventriculomegaly may be due to ex vacuo dilatation from brain parenchymal volume loss and chronic right inferolateral frontal lobe infarction or possibly early entrapment, attention at follow-up recommended. CTA neck: 1. Patent carotid and vertebral arteries. No dissection, aneurysm, or hemodynamically significant stenosis utilizing NASCET criteria. 2. Mild spondylosis of the cervical spine. CTA head: 1. Patent anterior and posterior intracranial circulation. No large vessel occlusion, aneurysm, vascular malformation, or significant stenosis identified. These results were called by telephone at the time of interpretation on 07/17/2018 at 7:50 pm to Dr. Milon Dikes , who verbally acknowledged these results. Electronically Signed   By: Mitzi Hansen M.D.   On: 07/17/2018 20:16   Ct Head Wo Contrast  Result Date: 07/18/2018 CLINICAL DATA:  Intraparenchymal hemorrhage EXAM: CT HEAD WITHOUT CONTRAST TECHNIQUE: Contiguous axial images were obtained from the base of the skull through the vertex without intravenous contrast. COMPARISON:  None. FINDINGS: Brain: Large LEFT cerebral hemorrhage centered in the basal ganglia and deep white matter of the frontal lobe measured at same orientation at 8.2 x 4.3 by 5.3 cm (volume = 98 cm^3)compared to 8.9 by 4.6 x 5.2 cm (volume = 110 cm^3). Visually large hematoma is very similar. Peripheral vasogenic edema similar. Persistent LEFT to RIGHT midline shift measuring 6 mm (image 13/13) compared to 5 mm remeasured. There is compression of the LEFT lateral ventricle. Mild prominence of the atria of the LEFT ventricle similar prior. Similar appearance to the RIGHT ventricle. Small amount of intra ventricular blood layers in the atria of the RIGHT ventricle. Basilar cisterns are patent. Vascular: No hyperdense vessel or unexpected calcification. Skull: Normal. Negative for fracture or focal lesion.  Sinuses/Orbits: No acute finding. Other: None IMPRESSION: 1. No increase in volume of large acute intraparenchymal LEFT cerebral hemorrhage with potential mild contraction. 2. No significant change in mass effect with mild (40mm) LEFT to RIGHT midline shift and compression of the LEFT lateral ventricle. 3. No change in small volume intraventricular hemorrhage. 4. Basal cisterns are patent. Electronically Signed   By: Genevive Bi M.D.   On: 07/18/2018 12:50   Ct Head Wo Contrast  Result Date: 07/18/2018 CLINICAL DATA:  Follow-up examination for intracranial hemorrhage. EXAM: CT HEAD WITHOUT CONTRAST TECHNIQUE: Contiguous axial images were obtained from the base of the skull through the vertex without intravenous contrast. COMPARISON:  Prior CT from 07/17/2018. FINDINGS: Brain: Large intraparenchymal hematoma centered at the left basal ganglia is perhaps slightly contracted from previous measuring approximately 106 cc, previously 110 cc). Surrounding vasogenic edema with regional mass effect relatively similar. Adjacent left lateral ventricle largely effaced with up to 8 mm of left-to-right midline shift. Evidence for intraventricular extension with blood in the lateral ventricles as well as the cerebral aqueduct, slightly increased from previous. Slight asymmetric dilatation of the temporal horn of the right lateral ventricle has slightly worsened, possibly reflecting developing ventricular trapping. No other new intracranial hemorrhage. No extra-axial fluid collection. No acute large vessel territory infarct. Vascular: No new hyperdense vessel. Calcified atherosclerosis at the skull base. Skull: Scalp soft tissues and calvarium demonstrate no acute finding. Sinuses/Orbits: Globes and orbital soft tissues within normal limits. Mild mucosal thickening within the paranasal sinuses. Mastoid air cells remain clear. Other: None. IMPRESSION: 1. Slight interval contraction of large hematoma centered at the left  basal ganglia, now measuring 106 cc, previously 110 cc. Associated intraventricular extension with small volume intraventricular blood, slightly increased from previous. 2.  Similar regional mass effect with up to 8 mm left-to-right shift. Slightly worsened dilatation of the temporal horn of the right lateral ventricle, which could reflect developing ventricular trapping. Attention at follow-up. 3. No other new acute intracranial abnormality. Electronically Signed   By: Rise Mu M.D.   On: 07/18/2018 03:09   Ct Angio Neck W Or Wo Contrast  Result Date: 07/17/2018 CLINICAL DATA:  58 y/o M; right-sided weakness, left-sided gaze, nonverbal, code stroke. EXAM: CT HEAD WITHOUT CONTRAST CT ANGIOGRAPHY HEAD AND NECK TECHNIQUE: CT of the head was performed from skull base to vertex without intravenous contrast administration. Multidetector CT imaging of the head and neck was performed using the standard protocol during bolus administration of intravenous contrast. Multiplanar CT image reconstructions and MIPs were obtained to evaluate the vascular anatomy. Carotid stenosis measurements (when applicable) are obtained utilizing NASCET criteria, using the distal internal carotid diameter as the denominator. CONTRAST:  75mL ISOVUE-370 IOPAMIDOL (ISOVUE-370) INJECTION 76% COMPARISON:  None. FINDINGS: CT HEAD FINDINGS Brain: Large brain parenchymal hemorrhage centered within left basal ganglia measuring 8.8 x 4.5 x 5.2 cm (volume = 110 cm^3)(AP x ML x CC series 3, image 20 and series 5, image 43). Trace volume of hemorrhage is present within the occipital horn of right lateral ventricle. Edema and mass effect associated with the hematoma effaces the left lateral ventricle and results in 6 mm of left-to-right midline shift. The right lateral ventricle is mildly enlarged which may be due to parenchymal volume loss and ex vacuo dilatation from chronic infarction or possibly early ventricular entrapment, attention at  follow-up recommended. 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 There are moderate chronic microvascular ischemic changes of white matter and volume loss of the brain. Vascular: As below. Skull: Normal. Negative for fracture or focal lesion. Sinuses: Mild mucosal thickening within the paranasal sinuses. No sinus fluid level. Normal aeration of mastoid air cells. Orbits are unremarkable. Orbits: No acute finding. Review of the MIP images confirms the above findings CTA NECK FINDINGS Aortic arch: Four-vessel variant branching. Imaged portion shows no evidence of aneurysm or dissection. No significant stenosis of the major arch vessel origins. Right carotid system: No evidence of dissection, stenosis (50% or greater) or occlusion. Mild non stenotic calcific atherosclerosis of carotid bifurcation. Left carotid system: No evidence of dissection, stenosis (50% or greater) or occlusion. Vertebral arteries: Codominant. No evidence of dissection, stenosis (50% or greater) or occlusion. Skeleton: Mild spondylosis of the cervical spine. No high-grade bony spinal canal stenosis. No acute osseous abnormality is evident Other neck: Negative. Upper chest: Negative. Review of the MIP images confirms the above findings CTA HEAD FINDINGS Anterior circulation: No significant stenosis, proximal occlusion, aneurysm, or vascular malformation. Posterior circulation: No significant stenosis, proximal occlusion, aneurysm, or vascular malformation. Venous sinuses: As permitted by contrast timing, patent. Anatomic variants: None significant. Delayed phase: No abnormal intracranial enhancement. Review of the MIP images confirms the above findings IMPRESSION: CT head: 1. Large hematoma centered within the left basal ganglia measuring up to 8.8 cm, 110 cc. Associated edema and mass effect effaces the left lateral ventricle and results in 6 mm of left-to-right midline shift. 2. Trace volume of hemorrhage  within the occipital horn of right lateral ventricle. Right lateral ventriculomegaly may be due to ex vacuo dilatation from brain parenchymal volume loss and chronic right inferolateral frontal lobe infarction or possibly early entrapment, attention at follow-up recommended. CTA neck: 1. Patent carotid and vertebral arteries. No dissection, aneurysm, or  hemodynamically significant stenosis utilizing NASCET criteria. 2. Mild spondylosis of the cervical spine. CTA head: 1. Patent anterior and posterior intracranial circulation. No large vessel occlusion, aneurysm, vascular malformation, or significant stenosis identified. These results were called by telephone at the time of interpretation on 07/17/2018 at 7:50 pm to Dr. Milon Dikes , who verbally acknowledged these results. Electronically Signed   By: Mitzi Hansen M.D.   On: 07/17/2018 20:16   Mr Laqueta Jean YD Contrast  Result Date: 07/18/2018 CLINICAL DATA:  Intracranial hemorrhage.  Altered mental status. EXAM: MRI HEAD WITHOUT AND WITH CONTRAST TECHNIQUE: Multiplanar, multiecho pulse sequences of the brain and surrounding structures were obtained without and with intravenous contrast. CONTRAST:  9 mL Gadavist COMPARISON:  Head CT 07/18/2018 FINDINGS: BRAIN: Massive intraparenchymal hematoma centered in the left basal ganglia is unchanged in size. There is intraventricular extension into both lateral ventricles. Rightward midline shift measures 5 mm, unchanged. The size and configuration of the ventricles are unchanged. There is edema extending into the subcortical white matter of the left frontal and parietal lobes. Early confluent hyperintense T2-weighted signal of the periventricular and deep white matter, most commonly due to chronic ischemic microangiopathy. No abnormal contrast enhancement. VASCULAR: Major intracranial arterial and venous sinus flow voids are normal. SKULL AND UPPER CERVICAL SPINE: Calvarial bone marrow signal is normal. There is  no skull base mass. Visualized upper cervical spine and soft tissues are normal. SINUSES/ORBITS: No fluid levels or advanced mucosal thickening. No mastoid or middle ear effusion. The orbits are normal. IMPRESSION: 1. Unchanged size of large intraparenchymal hematoma centered in the left basal ganglia, most consistent with hypertensive hemorrhage. 2. No abnormal contrast enhancement or other evidence of underlying lesion. 3. Unchanged 5 mm rightward midline shift with intraventricular extension of hemorrhage. Electronically Signed   By: Deatra Robinson M.D.   On: 07/18/2018 16:21   Ct Head Code Stroke Wo Contrast  Result Date: 07/17/2018 CLINICAL DATA:  58 y/o M; right-sided weakness, left-sided gaze, nonverbal, code stroke. EXAM: CT HEAD WITHOUT CONTRAST CT ANGIOGRAPHY HEAD AND NECK TECHNIQUE: CT of the head was performed from skull base to vertex without intravenous contrast administration. Multidetector CT imaging of the head and neck was performed using the standard protocol during bolus administration of intravenous contrast. Multiplanar CT image reconstructions and MIPs were obtained to evaluate the vascular anatomy. Carotid stenosis measurements (when applicable) are obtained utilizing NASCET criteria, using the distal internal carotid diameter as the denominator. CONTRAST:  79mL ISOVUE-370 IOPAMIDOL (ISOVUE-370) INJECTION 76% COMPARISON:  None. FINDINGS: CT HEAD FINDINGS Brain: Large brain parenchymal hemorrhage centered within left basal ganglia measuring 8.8 x 4.5 x 5.2 cm (volume = 110 cm^3)(AP x ML x CC series 3, image 20 and series 5, image 43). Trace volume of hemorrhage is present within the occipital horn of right lateral ventricle. Edema and mass effect associated with the hematoma effaces the left lateral ventricle and results in 6 mm of left-to-right midline shift. The right lateral ventricle is mildly enlarged which may be due to parenchymal volume loss and ex vacuo dilatation from chronic  infarction or possibly early ventricular entrapment, attention at follow-up recommended. 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 There are moderate chronic microvascular ischemic changes of white matter and volume loss of the brain. Vascular: As below. Skull: Normal. Negative for fracture or focal lesion. Sinuses: Mild mucosal thickening within the paranasal sinuses. No sinus fluid level. Normal aeration of mastoid air cells. Orbits are unremarkable. Orbits: No  acute finding. Review of the MIP images confirms the above findings CTA NECK FINDINGS Aortic arch: Four-vessel variant branching. Imaged portion shows no evidence of aneurysm or dissection. No significant stenosis of the major arch vessel origins. Right carotid system: No evidence of dissection, stenosis (50% or greater) or occlusion. Mild non stenotic calcific atherosclerosis of carotid bifurcation. Left carotid system: No evidence of dissection, stenosis (50% or greater) or occlusion. Vertebral arteries: Codominant. No evidence of dissection, stenosis (50% or greater) or occlusion. Skeleton: Mild spondylosis of the cervical spine. No high-grade bony spinal canal stenosis. No acute osseous abnormality is evident Other neck: Negative. Upper chest: Negative. Review of the MIP images confirms the above findings CTA HEAD FINDINGS Anterior circulation: No significant stenosis, proximal occlusion, aneurysm, or vascular malformation. Posterior circulation: No significant stenosis, proximal occlusion, aneurysm, or vascular malformation. Venous sinuses: As permitted by contrast timing, patent. Anatomic variants: None significant. Delayed phase: No abnormal intracranial enhancement. Review of the MIP images confirms the above findings IMPRESSION: CT head: 1. Large hematoma centered within the left basal ganglia measuring up to 8.8 cm, 110 cc. Associated edema and mass effect effaces the left lateral ventricle and results in 6  mm of left-to-right midline shift. 2. Trace volume of hemorrhage within the occipital horn of right lateral ventricle. Right lateral ventriculomegaly may be due to ex vacuo dilatation from brain parenchymal volume loss and chronic right inferolateral frontal lobe infarction or possibly early entrapment, attention at follow-up recommended. CTA neck: 1. Patent carotid and vertebral arteries. No dissection, aneurysm, or hemodynamically significant stenosis utilizing NASCET criteria. 2. Mild spondylosis of the cervical spine. CTA head: 1. Patent anterior and posterior intracranial circulation. No large vessel occlusion, aneurysm, vascular malformation, or significant stenosis identified. These results were called by telephone at the time of interpretation on 07/17/2018 at 7:50 pm to Dr. Milon Dikes , who verbally acknowledged these results. Electronically Signed   By: Mitzi Hansen M.D.   On: 07/17/2018 20:16     PHYSICAL EXAM: Obese middle-aged African-American male not in distress. . Afebrile. Head is nontraumatic. Neck is supple without bruit.    Cardiac exam no murmur or gallop. Lungs are clear to auscultation. Distal pulses are well felt.  Neurological Exam :  Awake alert, expressive greater than receptive aphasia. Speaks a few words and occasional short sentences.  Follows most simple commands. Name 1/3, able to repeat 3 word sentences, but dysarthria. Able to follow gaze in all directions but does have left gaze preference. Able to look to the right past midline. Blinks to threat on the left but not on the right. Right lower facial weakness. Tongue midline. Motor system exam shows dense right hemiplegia with withdrawal in the right lower extremity and none in the right upper extremity on pain stimulation. Has spontaneous antigravity movements on the left side. Decreased response to pain on the right compared to the left. Gait not tested.   ASSESSMENT/PLAN Jamie Stewart is a 58 y.o. male  with history seizures, hypertension and hyperlipidemia presenting with right-sided weakness, leftward gaze.   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 Etiology likely hypertensive given location and hx of HTN on meds at home  Serial CT head large L BG ICH w/ edema and mass effect L lat ventricle with L to R shift. trace ICH R lateral ventricle.   CTA head and neck unremarkable  MRI w/w/o large left basal ganglia hemorrhage. No abnormal enhancing lesion or tumor  2D Echo  EF 60-65%  LDL 118   HgbA1c 6.4  Heparin subq for VTE prophylaxis  No antithrombotic prior to admission, now on No antithrombotic given hemorrhage  Therapy recommendations:  CLR. No support at home. Plan SNF.  Disposition:  Pending. Await SNF bed. Medically ready for d/c.  Financials unknown SW clarifying. Will be difficult to place if no insurance.   Cerebral Edema Induced Hypernatremia-> mild hyponatremia   Treated with 3% protocol in ICU, now off  CT serial showed stable mass effect  Na 139->133->132  BMP monitoring  Hypertension  Stable 130-140s  Treated with Cleviprex in ICU  Home meds: coreg, avapro, imdur  Restarted avapro 2/19  Long-term BP goal normotensive  Hyperlipidemia  Home meds:  zocor 20  LDL 118, goal < 70  Hold statin at present given ICH  Resume on discharge  Seizure history   On Keppra PTA.   Continue keppra  bid   EEG - focal left hemispheric dysfunction consistent with patient's known ICH. No seizures.  Other Stroke Risk Factors    Other Active Problems  Thrombocytopenia PLT 131->143->211->240  Hyponatremia N a139->133->132  Hospital day # 10  Marvel Plan, MD PhD Stroke Neurology 07/27/2018 5:04 PM     To contact Stroke Continuity provider, please refer to [email or web address]. After hours, contact General Neurology

## 2018-07-28 LAB — BASIC METABOLIC PANEL
Anion gap: 11 (ref 5–15)
BUN: 15 mg/dL (ref 6–20)
CO2: 23 mmol/L (ref 22–32)
Calcium: 9.2 mg/dL (ref 8.9–10.3)
Chloride: 99 mmol/L (ref 98–111)
Creatinine, Ser: 0.92 mg/dL (ref 0.61–1.24)
GFR calc Af Amer: >60 mL/min (ref >60)
GFR calc non Af Amer: >60 mL/min (ref >60)
Glucose, Bld: 119 mg/dL — ABNORMAL HIGH (ref 70–99)
Potassium: 3.8 mmol/L (ref 3.5–5.1)
Sodium: 133 mmol/L — ABNORMAL LOW (ref 135–145)

## 2018-07-28 LAB — CBC
HCT: 39.2 % (ref 39.0–52.0)
Hemoglobin: 13.3 g/dL (ref 13.0–17.0)
MCH: 32.4 pg (ref 26.0–34.0)
MCHC: 33.9 g/dL (ref 30.0–36.0)
MCV: 95.6 fL (ref 80.0–100.0)
NRBC: 0 % (ref 0.0–0.2)
Platelets: 259 10*3/uL (ref 150–400)
RBC: 4.1 MIL/uL — ABNORMAL LOW (ref 4.22–5.81)
RDW: 13.5 % (ref 11.5–15.5)
WBC: 6.1 10*3/uL (ref 4.0–10.5)

## 2018-07-28 NOTE — Progress Notes (Signed)
  Speech Language Pathology Treatment: Dysphagia  Patient Details Name: Jamie Stewart MRN: [id number] DOB: 08/21/1955 Today's Date: 07/28/2018 Time: 2449-7530 SLP Time Calculation (min) (ACUTE ONLY): 17 min  Assessment / Plan / Recommendation Clinical Impression  Pt up in chair, affected right arm hanging and pt staring at late lunch tray. SLP assisted repositioning and facilitated initiation to self feed with moderate tactile and verbal cues. Moderate cues to remove labial residue throughout meal and slow pace. Trials of thin water given without s/s aspiration.  No spontaneous verbalizations; repeated single word; phonemic error when attempting to state his name. Named "fork" without cues with perseverations. Followed commands during meal with cues. Recommend repeat MBS tomorrow for possible upgrade.    HPI HPI: Jamie Stewart is a 58 y.o. male with no documented history, based on his medications bottles, history of seizures and hypertension hyperlipidemia, presented to the emergency with unresponsiveness and left gaze. Per chart he is completely aphasic unable to provide any history. CT large hematoma centered within the left basal ganglia measuring up to 8.8 cm, 110 cc. Associated edema and mass effect effaces the left lateral ventricle, trace volume of hemorrhage within the occipital horn of right lateral ventricle. Right lateral ventriculomegaly may be due to ex vacuo dilatation from brain parenchymal volume loss and chronic right inferolateral frontal lobe infarction or possibly early entrapment, attention at follow-up recommended. CXR No acute cardiopulmonary abnormality seen. No new imaging since 2/14      SLP Plan  Continue with current plan of care;MBS       Recommendations  Diet recommendations: Dysphagia 1 (puree);Nectar-thick liquid Liquids provided via: Straw;Cup Medication Administration: Crushed with puree Supervision: Full supervision/cueing for compensatory  strategies Compensations: Slow rate;Small sips/bites;Follow solids with liquid;Clear throat intermittently;Lingual sweep for clearance of pocketing Postural Changes and/or Swallow Maneuvers: Seated upright 90 degrees                Oral Care Recommendations: Oral care BID Follow up Recommendations: Skilled Nursing facility SLP Visit Diagnosis: Dysphagia, oropharyngeal phase (R13.12);Aphasia (R47.01) Plan: Continue with current plan of care;MBS       GO                Royce Macadamia 07/28/2018, 4:43 PM  Breck Coons Lonell Face.Ed Nurse, children's [phone] Office [phone]

## 2018-07-28 NOTE — Progress Notes (Addendum)
STROKE TEAM PROGRESS NOTE   INTERVAL HISTORY No family is at the bedside. Pt lying in bed, still has partial aphasia and right hemiplegia.  Remains stable. Pending SNF  Vitals:   07/27/18 2110 07/27/18 2339 07/28/18 0357 07/28/18 0759  BP: (!) 131/95 130/85 130/88 123/84  Pulse: 98 89 76 83  Resp: 18 16 16 18   Temp: 98.1 F (36.7 C) 98.2 F (36.8 C) 98 F (36.7 C) 98.4 F (36.9 C)  TempSrc: Oral Oral Oral Oral  SpO2: 98% 98% 96% 96%  Weight:       CBC Latest Ref Rng & Units 07/28/2018 07/27/2018 07/26/2018  WBC 4.0 - 10.5 K/uL 6.1 6.4 6.2  Hemoglobin 13.0 - 17.0 g/dL 32.9 12.9(L) 12.3(L)  Hematocrit 39.0 - 52.0 % 39.2 38.2(L) 36.9(L)  Platelets 150 - 400 K/uL 259 240 211    BMP Latest Ref Rng & Units 07/28/2018 07/27/2018 07/26/2018  Glucose 70 - 99 mg/dL 518(A) 416(S) 063(K)  BUN 6 - 20 mg/dL 15 17 15   Creatinine 0.61 - 1.24 mg/dL 1.60 1.09 3.23  Sodium 135 - 145 mmol/L 133(L) 132(L) 133(L)  Potassium 3.5 - 5.1 mmol/L 3.8 3.7 3.7  Chloride 98 - 111 mmol/L 99 97(L) 99  CO2 22 - 32 mmol/L 23 22 25   Calcium 8.9 - 10.3 mg/dL 9.2 9.0 8.9     PHYSICAL EXAM Obese middle-aged African-American male in no acute distress. Afebrile. Head is nontraumatic. Neck is supple without bruit. Cardiac exam no murmur or gallop. Lungs are clear to auscultation. Distal pulses are well felt.  Neurological Exam  Awake alert, expressive greater than receptive aphasia. Speaks a few words and occasional short sentences.  Follows most simple commands, can show 2 fingers but cannot follow 3-step commands. Names 0/3 today, able to repeat 3 word sentences, but dysarthria. Able to follow gaze in all directions but does have left gaze preference. Able to look to the right past midline. Blinks to threat on the left but not on the right. Right lower facial weakness. Tongue midline. Motor system exam shows dense right hemiplegia with withdrawal in the right lower extremity and none in the right upper extremity on pain  stimulation. Has spontaneous antigravity movements on the left side. Decreased response to pain on the right compared to the left. Gait not tested. Condom cath on.   ASSESSMENT/PLAN Jamie Stewart is a 58 y.o. male with history seizures, hypertension and hyperlipidemia presenting with right-sided weakness, leftward gaze.   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 Etiology likely hypertensive given location and hx of HTN  Serial CT head large L BG ICH w/ edema and mass effect L lat ventricle with L to R shift. trace ICH R lateral ventricle.   CTA head and neck unremarkable  MRI w/w/o large left basal ganglia hemorrhage. No abnormal enhancing lesion or tumor  2D Echo  EF 60-65%  LDL 118   HgbA1c 6.4  Heparin subq for VTE prophylaxis  No antithrombotic prior to admission, now on No antithrombotic given hemorrhage  Therapy recommendations:  CLR. No support at home. Plan SNF.  Disposition:  Pending. Await SNF bed. Medically ready for d/c.  Financials unknown SW clarifying. May be difficult to place.   Cerebral Edema Induced Hypernatremia-> mild hyponatremia   Treated with 3% protocol in ICU, now off  CT serial showed stable mass effect  Na 139->133  BMP monitoring  Hypertension  Stable 120-130s  Treated with Cleviprex in  ICU  Home meds: coreg, avapro, imdur  Restarted avapro 2/19  Long-term BP goal normotensive  Hyperlipidemia  Home meds:  zocor 20  LDL 118, goal < 70  Hold statin at present given ICH  Resume on discharge  Seizure history   On Keppra PTA.   Continue keppra 500mg  bid   EEG - focal left hemispheric dysfunction consistent with patient's known ICH. No seizures.  Other Active Problems  Thrombocytopenia PLT 131->143->211->240->259  Hyponatremia N a139->133->132->133  Hospital day # 11  Jamie Main, MSN, APRN, ANVP-BC, AGPCNP-BC Advanced Practice Stroke Nurse [facility] See  Amion for Schedule & Pager information 07/28/2018 1:52 PM   ATTENDING NOTE: I reviewed above note and agree with the assessment and plan. Pt was seen and examined.   Patient sitting in chair, just worked with PT/OT.  Still has right hemiplegia and partial aphasia.  BP stable, labs unremarkable except mild hyponatremia sodium 133.  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  Continue current management.  Marvel Plan, MD PhD Stroke Neurology 07/28/2018 4:28 PM     To contact Stroke Continuity provider, please refer to [email or web address]. After hours, contact General Neurology

## 2018-07-28 NOTE — Progress Notes (Addendum)
Physical Therapy Treatment Patient Details Name: Jamie Stewart MRN: [id number] DOB: 08/21/1955 Today's Date: 07/28/2018    History of Present Illness Pt is a 58 y/o male with no documented PMH, who presented to the ED with unresponsiveness and left gaze. CT revealed a large hematoma centered within the left basal ganglia measuring up to 8.8 cm, 110 cc. Associated edema and mass effect effaces the left lateral ventricle, trace volume of hemorrhage within the occipital horn of right lateral ventricle.    PT Comments    Patient not progressing with goals this date likely due to being in bed all weekend. Requires heavy assist of 2 to get to EOB with less initiation noted today. Heavy right lateral lean in all positions and despite max verbal, manual and gestural cues, pt unable to activate lateral flexors and get to midline except when sitting in chair using LUE. Pt with some tone in RUE noted today. Continues to have minimal verbalizations. Will follow.   Follow Up Recommendations  SNF;Supervision/Assistance - 24 hour     Equipment Recommendations  None recommended by PT    Recommendations for Other Services       Precautions / Restrictions Precautions Precautions: Fall Precaution Comments: right hemiplegic Restrictions Weight Bearing Restrictions: No    Mobility  Bed Mobility Overal bed mobility: Needs Assistance Bed Mobility: Rolling;Sidelying to Sit Rolling: Max assist Sidelying to sit: Max assist;HOB elevated       General bed mobility comments: Step by step cues to reach for rail and push through LLE to scoot bottom; assist with RLE to get to EOB and to elevate trunk,  Transfers Overall transfer level: Needs assistance Equipment used: 2 person hand held assist Transfers: Sit to/from Stand Sit to Stand: Max assist;+2 safety/equipment;From elevated surface Stand pivot transfers: Total assist       General transfer comment: Assist of 2 to power to standing with pt  pulling up on sara stedy and therapist supporting Rt side trunk. Heavy right lateral lean. not able to self correct today despite cues. Max cues for upright and midline posture. Stood from EOB to [place] stedy and from stedy x1. Transferred to chair via stedy.  Ambulation/Gait             General Gait Details: Unable to progress gait training at this time.    Stairs             Wheelchair Mobility    Modified Rankin (Stroke Patients Only) Modified Rankin (Stroke Patients Only) Pre-Morbid Rankin Score: No symptoms Modified Rankin: Severe disability     Balance Overall balance assessment: Needs assistance Sitting-balance support: Feet supported;Single extremity supported Sitting balance-Leahy Scale: Poor Sitting balance - Comments: requires UE support to maintain static sitting balance with bil feet placed on floor; heavy right lateral lean present with no activation of lateral flexors. Worked on finding midline and upright posture. Postural control: Right lateral lean Standing balance support: During functional activity Standing balance-Leahy Scale: Zero Standing balance comment: +2 required for standing balance; limited due to heavy right lateral lean. Not able to get to midline today.                            Cognition Arousal/Alertness: Awake/alert Behavior During Therapy: Flat affect Overall Cognitive Status: Difficult to assess  General Comments: Attempted some verbalizations however not making any sense; head nods and yes/no appropriate ~50% of the time.      Exercises      General Comments General comments (skin integrity, edema, etc.): Some tone noted in RUE today      Pertinent Vitals/Pain Pain Assessment: Faces Faces Pain Scale: Hurts little more Pain Location: RUE with movement Pain Descriptors / Indicators: Grimacing Pain Intervention(s): Monitored during session    Home Living                       Prior Function            PT Goals (current goals can now be found in the care plan section) Progress towards PT goals: Not progressing toward goals - comment(secondary to being bedbound all weekend)    Frequency    Min 3X/week      PT Plan Frequency needs to be updated    Co-evaluation              AM-PAC PT "6 Clicks" Mobility   Outcome Measure  Help needed turning from your back to your side while in a flat bed without using bedrails?: A Lot Help needed moving from lying on your back to sitting on the side of a flat bed without using bedrails?: Total Help needed moving to and from a bed to a chair (including a wheelchair)?: Total Help needed standing up from a chair using your arms (e.g., wheelchair or bedside chair)?: Total Help needed to walk in hospital room?: Total Help needed climbing 3-5 steps with a railing? : Total 6 Click Score: 7    End of Session Equipment Utilized During Treatment: Gait belt Activity Tolerance: Patient tolerated treatment well Patient left: in chair;with call bell/phone within reach;with chair alarm set;with nursing/sitter in room Nurse Communication: Mobility status;Need for lift equipment PT Visit Diagnosis: Hemiplegia and hemiparesis;Other symptoms and signs involving the nervous system (R29.898) Hemiplegia - Right/Left: Right Hemiplegia - dominant/non-dominant: Dominant Hemiplegia - caused by: Nontraumatic intracerebral hemorrhage     Time: 0272-5366 PT Time Calculation (min) (ACUTE ONLY): 23 min  Charges:  $Therapeutic Activity: 8-22 mins $Neuromuscular Re-education: 8-22 mins                     Mylo Red, PT, DPT Acute Rehabilitation Services Pager [phone] Office [phone]       Blake Divine A Lanier Ensign 07/28/2018, 2:50 PM

## 2018-07-29 ENCOUNTER — Inpatient Hospital Stay (HOSPITAL_COMMUNITY): Payer: Medicaid Other

## 2018-07-29 NOTE — Progress Notes (Signed)
Modified Barium Swallow Progress Note  Patient Details  Name: Jamie Stewart MRN: [id number] Date of Birth: 08/21/1955  Today's Date: 07/29/2018  Modified Barium Swallow completed.  Full report located under Chart Review in the Imaging Section.  Brief recommendations include the following:  Clinical Impression  Swallow abilities are generally similar to initial MBS 2/14 with onset of swallow initiating somewhat faster and less frequently at the pyriform sinuses than previous study. He continues to exhibit decreased sensation to right facial/labial area evidence by large amount of eggs removed from right buccal cavity prior to initiating today's MBS. Laryngeal penetration was flash with thin except for one questionable trace amount possibly on high up on anterior portion of laryngeal vestibule. He was challenged with large amount of consecutive sips barium via straw throughout. No significant pharyngeal residue. Oral impairments marked by right labial spill, pocketing of barium and mild lingual residue spontaneously swallowed to clear. Recommend he upgrade to thin liquids, straws allowed with smaller sips preferred, continue Dys 1 texture and pt needs FULL SUPERVISION to initiate feeding, manage bite/sip size and check/remove pocketing on right side oral cavity; continue to crush pills. Unremarkable esophageal view.       Swallow Evaluation Recommendations       SLP Diet Recommendations: Dysphagia 1 (Puree) solids;Thin liquid   Liquid Administration via: Straw;Cup   Medication Administration: Crushed with puree   Supervision: Patient able to self feed;Full supervision/cueing for compensatory strategies;Staff to assist with self feeding   Compensations: Lingual sweep for clearance of pocketing;Minimize environmental distractions;Slow rate;Small sips/bites   Postural Changes: Seated upright at 90 degrees   Oral Care Recommendations: Oral care BID        Royce Macadamia 07/29/2018,12:15 PM   Breck Coons [place].Ed Nurse, children's [phone] Office [phone]

## 2018-07-29 NOTE — Progress Notes (Signed)
Occupational Therapy Treatment Patient Details Name: Jamie Stewart MRN: [id number] DOB: 08/21/1955 Today's Date: 07/29/2018    History of present illness Pt is a 58 y/o male with no documented PMH, who presented to the ED with unresponsiveness and left gaze. CT revealed a large hematoma centered within the left basal ganglia measuring up to 8.8 cm, 110 cc. Associated edema and mass effect effaces the left lateral ventricle, trace volume of hemorrhage within the occipital horn of right lateral ventricle.   OT comments  Upon entering the room, pt supine in bed sleeping soundly. Pt starting session from flat bed surface with focus on bed mobility and following commands. Pt needing total A but was able to utilize R UE to elevate trunk to sit EOB. Pt began pushing heavily with L UE while in sitting position requiring max A for safety. Forced weight bearing onto L elbow with pt pushing up onto palm only to midline with min cuing x 10 reps. Pt then able to place B UE into lap for static sitting for 2 minutes with close supervision and fading to min guard x 2 reps. Pt returning to supine secondary to fatigue with total A for repositioning for comfort and to support hemiplegic side.   Follow Up Recommendations  SNF    Equipment Recommendations  Other (comment)(defer to next venue of care)    Recommendations for Other Services      Precautions / Restrictions Precautions Precautions: Fall Precaution Comments: right hemiplegic Restrictions Weight Bearing Restrictions: No       Mobility Bed Mobility Overal bed mobility: Needs Assistance Bed Mobility: Rolling;Sidelying to Sit   Sidelying to sit: Total assist       General bed mobility comments: max cuing to utilize L LE to slide R LE off EOB. Attempted from flat bed with rolling onto R side and pushing through L LE into midline position with assist  Transfers                      Balance Overall balance assessment: Needs  assistance Sitting-balance support: Feet supported;Single extremity supported Sitting balance-Leahy Scale: Fair Sitting balance - Comments: moments of supervison static sitting                                   ADL either performed or assessed with clinical judgement        Vision Patient Visual Report: No change from baseline            Cognition Arousal/Alertness: Awake/alert Behavior During Therapy: Flat affect                     Pertinent Vitals/ Pain       Pain Assessment: Faces Faces Pain Scale: Hurts little more Pain Location: RUE with movement Pain Descriptors / Indicators: Grimacing Pain Intervention(s): Monitored during session;Repositioned         Frequency  Min 2X/week        Progress Toward Goals  OT Goals(current goals can now be found in the care plan section)  Progress towards OT goals: Progressing toward goals  Acute Rehab OT Goals Patient Stated Goal: Pt did not state goals during session  Plan Discharge plan needs to be updated       AM-PAC OT "6 Clicks" Daily Activity     Outcome Measure   Help from another person eating meals?: A Lot Help from another  person taking care of personal grooming?: A Lot Help from another person toileting, which includes using toliet, bedpan, or urinal?: Total Help from another person bathing (including washing, rinsing, drying)?: A Lot Help from another person to put on and taking off regular upper body clothing?: A Lot Help from another person to put on and taking off regular lower body clothing?: Total 6 Click Score: 10    End of Session    OT Visit Diagnosis: Muscle weakness (generalized) (M62.81);Hemiplegia and hemiparesis Hemiplegia - Right/Left: Right Hemiplegia - dominant/non-dominant: Dominant Hemiplegia - caused by: Nontraumatic intracerebral hemorrhage   Activity Tolerance Patient tolerated treatment well   Patient Left in bed;with call bell/phone within reach;with  bed alarm set   Nurse Communication Mobility status        Time: 1450-1515 OT Time Calculation (min): 25 min  Charges: OT General Charges $OT Visit: 1 Visit OT Treatments $Neuromuscular Re-education: 23-37 mins   Alen Bleacher, MS, OTR/L 07/29/2018, 3:40 PM

## 2018-07-29 NOTE — Progress Notes (Addendum)
STROKE TEAM PROGRESS NOTE   INTERVAL HISTORY Transporter at bedside. Going for MBSS with hopes to increase diet consistency. Denies pain or discomfort. Appears sad.   Vitals:   07/28/18 2005 07/29/18 0001 07/29/18 0323 07/29/18 0721  BP: 125/89 128/78 (!) 139/97 115/90  Pulse: 83 84 93 98  Resp: 16 17 18 18   Temp: 98.7 F (37.1 C) 98.6 F (37 C) 99 F (37.2 C) 97.8 F (36.6 C)  TempSrc: Oral Oral Oral Axillary  SpO2: 98% 99% 97% 97%  Weight:       CBC Latest Ref Rng & Units 07/28/2018 07/27/2018 07/26/2018  WBC 4.0 - 10.5 K/uL 6.1 6.4 6.2  Hemoglobin 13.0 - 17.0 g/dL 40.9 12.9(L) 12.3(L)  Hematocrit 39.0 - 52.0 % 39.2 38.2(L) 36.9(L)  Platelets 150 - 400 K/uL 259 240 211    BMP Latest Ref Rng & Units 07/28/2018 07/27/2018 07/26/2018  Glucose 70 - 99 mg/dL 735(H) 299(M) 426(S)  BUN 6 - 20 mg/dL 15 17 15   Creatinine 0.61 - 1.24 mg/dL 3.41 9.62 2.29  Sodium 135 - 145 mmol/L 133(L) 132(L) 133(L)  Potassium 3.5 - 5.1 mmol/L 3.8 3.7 3.7  Chloride 98 - 111 mmol/L 99 97(L) 99  CO2 22 - 32 mmol/L 23 22 25   Calcium 8.9 - 10.3 mg/dL 9.2 9.0 8.9     PHYSICAL EXAM - no change in exam since yesterday Obese middle-aged African-American male in no acute distress. Afebrile. Head is nontraumatic. Neck is supple without bruit. Cardiac exam no murmur or gallop. Lungs are clear to auscultation. Distal pulses are well felt.  Neurological Exam  Awake alert, expressive greater than receptive aphasia. Speaks a few words and occasional short sentences.  Follows most simple commands, can show 2 fingers but cannot follow 3-step commands. Names 0/3 today, able to repeat 3 word sentences, but dysarthria. Able to follow gaze in all directions but does have left gaze preference. Able to look to the right past midline. Blinks to threat on the left but not on the right. Right lower facial weakness. Tongue midline. Motor system exam shows dense right hemiplegia with withdrawal in the right lower extremity and none in  the right upper extremity on pain stimulation. Has spontaneous antigravity movements on the left side. Decreased response to pain on the right compared to the left. Gait not tested. Condom cath on.   ASSESSMENT/PLAN [name] is a 58 y.o. male with history seizures, hypertension and hyperlipidemia presenting with right-sided weakness, leftward gaze.   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 Etiology likely hypertensive given location and hx of HTN  Serial CT head large L BG ICH w/ edema and mass effect L lat ventricle with L to R shift. trace ICH R lateral ventricle.   CTA head and neck unremarkable  MRI w/w/o large left basal ganglia hemorrhage. No abnormal enhancing lesion or tumor  2D Echo  EF 60-65%  LDL 118   HgbA1c 6.4  Heparin subq for VTE prophylaxis  No antithrombotic prior to admission, now on No antithrombotic given hemorrhage  Therapy recommendations:  CLR. No support at home. Plan SNF.  Disposition:  Pending. Await SNF bed. Medically ready for d/c.  Financials unknown SW clarifying. May be difficult to place.   D/c tele (12 days). D/c ICU prescribed PPI (not on PTA).  Cerebral Edema, resolving Induced Hypernatremia-> mild hyponatremia   Treated with 3% protocol in ICU, now off  CT serial showed stable mass  effect  Na 139->133  Dysphagia  Secondary to stroke  On pureed nectar thick diet  For MBSS today   SLP following  Hypertension  Stable 110-130s  Treated with Cleviprex in ICU  Home meds: coreg, avapro, imdur  Restarted avapro alone 2/19  Long-term BP goal normotensive  Hyperlipidemia  Home meds:  zocor 20  LDL 118, goal < 70  Hold statin at present given ICH  Resume on discharge  Seizure history   On Keppra PTA.   Continue keppra 500mg  bid   EEG - focal left hemispheric dysfunction consistent with patient's known ICH. No seizures.  Other Active Problems  Thrombocytopenia,  resolved PLT 131->143->211->240->259  Hyponatremia Na139->133->132->133  Hospital day # 12  Jamie Main, MSN, APRN, ANVP-BC, AGPCNP-BC Advanced Practice Stroke Nurse [facility] See Amion for Schedule & Pager information 07/29/2018 8:30 AM   ATTENDING NOTE: I reviewed above note and agree with the assessment and plan. Pt was seen and examined.   Patient lying in bed, going to have modified barium study.  Later, he was cleared for dysphagia 1 diet and thin liquid.  Still has mild aphasia and right hemiplegia.  BP stable, no acute event overnight.  Pending social worker working on SNF placement.  PT/OT and risk factor modification.  Marvel Plan, MD PhD Stroke Neurology 07/29/2018 2:26 PM     To contact Stroke Continuity provider, please refer to [email or web address]. After hours, contact General Neurology

## 2018-07-30 LAB — CBC
HCT: 38.3 % — ABNORMAL LOW (ref 39.0–52.0)
Hemoglobin: 12.6 g/dL — ABNORMAL LOW (ref 13.0–17.0)
MCH: 31.9 pg (ref 26.0–34.0)
MCHC: 32.9 g/dL (ref 30.0–36.0)
MCV: 97 fL (ref 80.0–100.0)
Platelets: 297 10*3/uL (ref 150–400)
RBC: 3.95 MIL/uL — ABNORMAL LOW (ref 4.22–5.81)
RDW: 13.6 % (ref 11.5–15.5)
WBC: 5.9 10*3/uL (ref 4.0–10.5)
nRBC: 0 % (ref 0.0–0.2)

## 2018-07-30 LAB — BASIC METABOLIC PANEL
Anion gap: 11 (ref 5–15)
BUN: 13 mg/dL (ref 6–20)
CO2: 24 mmol/L (ref 22–32)
Calcium: 9.3 mg/dL (ref 8.9–10.3)
Chloride: 101 mmol/L (ref 98–111)
Creatinine, Ser: 0.77 mg/dL (ref 0.61–1.24)
GFR calc Af Amer: >60 mL/min (ref >60)
GFR calc non Af Amer: >60 mL/min (ref >60)
Glucose, Bld: 108 mg/dL — ABNORMAL HIGH (ref 70–99)
Potassium: 3.8 mmol/L (ref 3.5–5.1)
Sodium: 136 mmol/L (ref 135–145)

## 2018-07-30 NOTE — Progress Notes (Addendum)
Speech Language Pathology Treatment: Dysphagia;Cognitive-Linquistic  Patient Details Name: Jamie Stewart MRN: [id number] DOB: 08/21/1955 Today's Date: 07/30/2018 Time: 5974-1638 SLP Time Calculation (min) (ACUTE ONLY): 55 min  Assessment / Plan / Recommendation Clinical Impression   Pt seen for diet tolerance/advancement and aphasia tx. Dys 1, thin liquid breakfast tray overall tolerated, except for coughing x1 on consecutive sips of liquid intake. Small controlled sips with straw most successful in preventing s/sx of aspiration and anterior spillage. Directed awareness to anterior spillage of liquid intake via cup and puree intake and cued x2 to direct liquid/solid to L side of oral cavity. Max verbal, visual and tactile cueing required for pt to clear pocketing of puree in R buccal cavity with lingual sweep and liquid wash. Regular textured solid PO trialed with display of appropriate mastication and no overt s/sx of aspiration. Pocketing of solid noted, but with mod-max verbal/visual/tactile cues to lingual sweep and liquid wash pt able to clear. Recommend dys 2, thin diet with full supervision to provide cues for lingual sweep and small sips of liquid. Will f/u for tolerance and use of strategies.   Aphasia and cognitive impairment addressed during and after meal intake. Pt minimally verbal but with fair receptive abilities. He largely relied on yes/no questions and gestural cues to initiate request for wants/needs. When given yes/no question, pt was 25% accurate in verifying basic biographical information and 75% accurate in identifying objects with head nod/shake. During meal intake and when provided with 1 step commands, pt required max verbal/visual/tactile cues and hand-over-hand assist to direct attention and awareness to objects and events occurring in R visual field. He required gestural and semantic cues to identify 2 objects in a field of 4. When asked to demonstrate use of identified  object, as well as during oral care, pt displayed several instances of ideational apraxia vs. perseveration and required max verbal and gestural cues to redirect. Recommend continuation of aphasia tx with focus on expression of wants/needs, 1-2 step commands, and attention deficits.     HPI HPI: Jamie Stewart is a 58 y.o. male with no documented history, based on his medications bottles, history of seizures and hypertension hyperlipidemia, presented to the emergency with unresponsiveness and left gaze. Per chart he is completely aphasic unable to provide any history. CT large hematoma centered within the left basal ganglia measuring up to 8.8 cm, 110 cc. Associated edema and mass effect effaces the left lateral ventricle, trace volume of hemorrhage within the occipital horn of right lateral ventricle. Right lateral ventriculomegaly may be due to ex vacuo dilatation from brain parenchymal volume loss and chronic right inferolateral frontal lobe infarction or possibly early entrapment, attention at follow-up recommended. CXR No acute cardiopulmonary abnormality seen. Initial MBS 07/18/18 recommended DYs 1, nectar. MBS repeated to determine recommendation for upgrade textures/liquids       SLP Plan  Continue with current plan of care       Recommendations  Diet recommendations: Dysphagia 2 (fine chop);Thin liquid Liquids provided via: Straw;Cup Medication Administration: Crushed with puree Supervision: Full supervision/cueing for compensatory strategies Compensations: Lingual sweep for clearance of pocketing;Minimize environmental distractions;Slow rate;Small sips/bites Postural Changes and/or Swallow Maneuvers: Seated upright 90 degrees                Oral Care Recommendations: Oral care BID Follow up Recommendations: Skilled Nursing facility SLP Visit Diagnosis: Dysphagia, oropharyngeal phase (R13.12) Plan: Continue with current plan of care       GO  Jamie Stewart  Jamie Stewart,Student SLP 07/30/2018, 10:31 AM

## 2018-07-30 NOTE — Progress Notes (Signed)
Physical Therapy Treatment Patient Details Name: Jamie Stewart MRN: [id number] DOB: 08/21/1955 Today's Date: 07/30/2018    History of Present Illness Pt is a 58 y/o male with no documented PMH, who presented to the ED with unresponsiveness and left gaze. CT revealed a large hematoma centered within the left basal ganglia measuring up to 8.8 cm, 110 cc. Associated edema and mass effect effaces the left lateral ventricle, trace volume of hemorrhage within the occipital horn of right lateral ventricle.    PT Comments    Pt remains unable to initiate movement with his R side, both UE and LE. Pt continues to require max to total assist for all bed mobility and transfers. Pt requires correcting with max cues when leaning to the R side during standing balance and sit to/from stand. Pt progress indicative of SNF/24 hour supervision as an appropriate discharge plan. Plan to progress pt with increased duration of standing balance tasks, and improved bed mobility as he becomes more capable.    Follow Up Recommendations  SNF;Supervision/Assistance - 24 hour     Equipment Recommendations  None recommended by PT    Recommendations for Other Services Rehab consult     Precautions / Restrictions Precautions Precautions: Fall Precaution Comments: right hemiplegic Restrictions Weight Bearing Restrictions: No    Mobility  Bed Mobility Overal bed mobility: Needs Assistance   Rolling: Max assist Sidelying to sit: Total assist;+2 for physical assistance          Transfers Overall transfer level: Needs assistance   Transfers: Sit to/from Stand Sit to Stand: Max assist;+2 safety/equipment;From elevated surface;+2 physical assistance         General transfer comment: Assist of 2 to power to standing with pt pulling up on sara stedy and therapist supporting Rt side trunk. Heavy right lateral lean. not able to self correct today despite cues. Max cues for upright and midline posture. Stood from  EOB to sara stedy x 5 and from stedy x1. Transferred to chair via stedy.  Ambulation/Gait                 Stairs             Wheelchair Mobility    Modified Rankin (Stroke Patients Only)       Balance Overall balance assessment: Needs assistance Sitting-balance support: Feet supported;Single extremity supported Sitting balance-Leahy Scale: Fair Sitting balance - Comments: moments of supervison static sitting Postural control: Right lateral lean Standing balance support: Single extremity supported Standing balance-Leahy Scale: Zero Standing balance comment: +2 required for standing balance; limited due to heavy right lateral lean. Able to get to midline today with max vc/tc, but only maintains temporarily before returning to heavy R lean.                             Cognition Arousal/Alertness: Awake/alert Behavior During Therapy: Flat affect Overall Cognitive Status: Difficult to assess                           Safety/Judgement: Decreased awareness of safety;Decreased awareness of deficits   Problem Solving: Difficulty sequencing;Requires verbal cues;Requires tactile cues General Comments: Attempted some verbalizations however not making any sense; head nods and yes/no appropriate ~50% of the time.      Exercises General Exercises - Lower Extremity Ankle Circles/Pumps: AROM;10 reps;Both;Limitations;PROM;Supine Ankle Circles/Pumps Limitations: Pt flaccid on R side and only able to tolerate PROM. Quad Sets:  AROM;10 reps;Both;Limitations;PROM;Supine Quad Sets Limitations: Pt flaccid on R side and only able to tolerate PROM. Heel Slides: AROM;10 reps;Both;Limitations;PROM;Supine Heel Slides Limitations: Pt flaccid on R side and only able to tolerate PROM. Hip ABduction/ADduction: PROM;AROM;10 reps;Limitations;Both Hip Abduction/Adduction Limitations: Pt flaccid on R side and only able to tolerate PROM.    General Comments         Pertinent Vitals/Pain Pain Assessment: No/denies pain Faces Pain Scale: No hurt    Home Living                      Prior Function            PT Goals (current goals can now be found in the care plan section) Acute Rehab PT Goals Patient Stated Goal: to get to his recliner PT Goal Formulation: With patient Potential to Achieve Goals: Good    Frequency    Min 3X/week      PT Plan Current plan remains appropriate    Co-evaluation              AM-PAC PT "6 Clicks" Mobility   Outcome Measure  Help needed turning from your back to your side while in a flat bed without using bedrails?: A Lot Help needed moving from lying on your back to sitting on the side of a flat bed without using bedrails?: Total Help needed moving to and from a bed to a chair (including a wheelchair)?: Total Help needed standing up from a chair using your arms (e.g., wheelchair or bedside chair)?: Total Help needed to walk in hospital room?: Total Help needed climbing 3-5 steps with a railing? : Total 6 Click Score: 7    End of Session Equipment Utilized During Treatment: Gait belt Activity Tolerance: Patient tolerated treatment well Patient left: in chair;with call bell/phone within reach;with chair alarm set Nurse Communication: Mobility status;Need for lift equipment PT Visit Diagnosis: Hemiplegia and hemiparesis;Other symptoms and signs involving the nervous system (R29.898) Hemiplegia - Right/Left: Right Hemiplegia - dominant/non-dominant: Dominant Hemiplegia - caused by: Nontraumatic intracerebral hemorrhage     Time: 9355-2174 PT Time Calculation (min) (ACUTE ONLY): 24 min  Charges:  $Therapeutic Exercise: 8-22 mins $Therapeutic Activity: 8-22 mins                     Margarita Mail, SPTA   Margarita Mail 07/30/2018, 12:07 PM

## 2018-07-30 NOTE — Progress Notes (Addendum)
STROKE TEAM PROGRESS NOTE   INTERVAL HISTORY Pt up in chair at bedside. Denied being sad when I asked if he was. Now on thin liquid diet after MBSS yesterday. Still await rehab venue.   Vitals:   07/29/18 1935 07/30/18 0032 07/30/18 0347 07/30/18 0730  BP: (!) 136/98 (!) 136/95 (!) 131/93 129/90  Pulse: 91 75 75 77  Resp: 14 14 14 15   Temp: 98.2 F (36.8 C) 99.2 F (37.3 C) 99.5 F (37.5 C) 97.9 F (36.6 C)  TempSrc: Oral Oral Oral Oral  SpO2: 97% 97% 98% 95%  Weight:       CBC Latest Ref Rng & Units 07/30/2018 07/28/2018 07/27/2018  WBC 4.0 - 10.5 K/uL 5.9 6.1 6.4  Hemoglobin 13.0 - 17.0 g/dL 12.6(L) 13.3 12.9(L)  Hematocrit 39.0 - 52.0 % 38.3(L) 39.2 38.2(L)  Platelets 150 - 400 K/uL 297 259 240    BMP Latest Ref Rng & Units 07/30/2018 07/28/2018 07/27/2018  Glucose 70 - 99 mg/dL 474(Q) 595(G) 387(F)  BUN 6 - 20 mg/dL 13 15 17   Creatinine 0.61 - 1.24 mg/dL 6.43 3.29 5.18  Sodium 135 - 145 mmol/L 136 133(L) 132(L)  Potassium 3.5 - 5.1 mmol/L 3.8 3.8 3.7  Chloride 98 - 111 mmol/L 101 99 97(L)  CO2 22 - 32 mmol/L 24 23 22   Calcium 8.9 - 10.3 mg/dL 9.3 9.2 9.0     PHYSICAL EXAM - no change in exam Obese middle-aged African-American male in no acute distress. Afebrile. Head is nontraumatic. Neck is supple without bruit. Cardiac exam no murmur or gallop. Lungs are clear to auscultation. Distal pulses are well felt.  Neurological Exam  Awake alert, expressive greater than receptive aphasia. Speaks a few words and occasional short sentences.  Follows most simple commands, can show 2 fingers but cannot follow 3-step commands. Names 0/3 today, able to repeat 3 word sentences, but dysarthria. Able to follow gaze in all directions but does have left gaze preference. Able to look to the right past midline. Blinks to threat on the left but not on the right. Right lower facial weakness. Tongue midline. Motor system exam shows dense right hemiplegia with withdrawal in the right lower extremity and  none in the right upper extremity on pain stimulation. Has spontaneous antigravity movements on the left side. Decreased response to pain on the right compared to the left. Gait not tested.    ASSESSMENT/PLAN Jamie Stewart is a 58 y.o. male with history seizures, hypertension and hyperlipidemia presenting with right-sided weakness, leftward gaze.   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 Etiology likely hypertensive given location and hx of HTN  Serial CT head large L BG ICH w/ edema and mass effect L lat ventricle with L to R shift. trace ICH R lateral ventricle.   CTA head and neck unremarkable  MRI w/w/o large left basal ganglia hemorrhage. No abnormal enhancing lesion or tumor  2D Echo  EF 60-65%  LDL 118   HgbA1c 6.4  Heparin subq for VTE prophylaxis  No antithrombotic prior to admission, now on No antithrombotic given hemorrhage  Therapy recommendations:  CLR. No support at home. Plan SNF.  Disposition:  Pending. Await SNF bed. Medically ready for d/c.  Financials unknown SW clarifying. May be difficult to place.   Cerebral Edema, resolving Induced Hypernatremia-> mild hyponatremia   Treated with 3% protocol in ICU, now off  CT serial showed stable mass effect  Na 139->133  Dysphagia, resolving  Secondary to stroke  Passed MBSS yesterday   Upgraded to pureed thin liquid diet  SLP following  Hypertension  Stable 110-140s  Treated with Cleviprex in ICU  Home meds: coreg, avapro, imdur  Restarted avapro alone 2/19  Long-term BP goal normotensive  Hyperlipidemia  Home meds:  zocor 20  LDL 118, goal < 70  Hold statin at present given ICH  Resume on discharge  Seizure history   On Keppra PTA.   Continue keppra 500mg  bid   EEG - focal left hemispheric dysfunction consistent with patient's known ICH. No seizures.  Other Active Problems  Thrombocytopenia, resolved PLT 131->143->211->240->259->  297  Hyponatremia, resolved Na139->133->132->133->136  Hospital day # 13  Jamie Main, MSN, APRN, ANVP-BC, AGPCNP-BC Advanced Practice Stroke Nurse [facility] See Amion for Schedule & Pager information 07/30/2018 8:38 AM   ATTENDING NOTE: I reviewed above note and agree with the assessment and plan.    Patient no acute event overnight.  Neuro stable.  Still has right hemiplegia.  CBC BMP stable, sodium normalized at 136.  PT/OT.  Still pending for SNF placement.  Marvel Plan, MD PhD Stroke Neurology 07/30/2018 11:32 PM      To contact Stroke Continuity provider, please refer to [email or web address]. After hours, contact General Neurology

## 2018-07-31 NOTE — Progress Notes (Addendum)
STROKE TEAM PROGRESS NOTE   INTERVAL HISTORY Sleeping in bed. Easily awakens. No new complaints. Says he is "same ole".   Vitals:   07/31/18 0008 07/31/18 0413 07/31/18 0744 07/31/18 1133  BP: (!) 139/96 (!) 143/93 (!) 134/95 (!) 130/93  Pulse: 86 73 (!) 52 91  Resp: 18 18 14 17   Temp: 98.9 F (37.2 C) 98.6 F (37 C) 98.2 F (36.8 C) 98.6 F (37 C)  TempSrc: Oral Oral  Oral  SpO2: 97% 96% 95% 99%  Weight:       CBC Latest Ref Rng & Units 07/30/2018 07/28/2018 07/27/2018  WBC 4.0 - 10.5 K/uL 5.9 6.1 6.4  Hemoglobin 13.0 - 17.0 g/dL 12.6(L) 13.3 12.9(L)  Hematocrit 39.0 - 52.0 % 38.3(L) 39.2 38.2(L)  Platelets 150 - 400 K/uL 297 259 240    BMP Latest Ref Rng & Units 07/30/2018 07/28/2018 07/27/2018  Glucose 70 - 99 mg/dL 889(V) 694(H) 038(U)  BUN 6 - 20 mg/dL 13 15 17   Creatinine 0.61 - 1.24 mg/dL 8.28 0.03 4.91  Sodium 135 - 145 mmol/L 136 133(L) 132(L)  Potassium 3.5 - 5.1 mmol/L 3.8 3.8 3.7  Chloride 98 - 111 mmol/L 101 99 97(L)  CO2 22 - 32 mmol/L 24 23 22   Calcium 8.9 - 10.3 mg/dL 9.3 9.2 9.0     PHYSICAL EXAM - no change in exam Obese middle-aged African-American male in no acute distress. Afebrile. Head is nontraumatic. Neck is supple without bruit. Cardiac exam no murmur or gallop. Lungs are clear to auscultation. Distal pulses are well felt.  Neurological Exam  Awake alert, expressive greater than receptive aphasia. Speaks a few words and occasional short sentences.  Follows most simple commands, can show 2 fingers but cannot follow 3-step commands. Names 0/3 today, able to repeat 3 word sentences, but dysarthria. Able to follow gaze in all directions but does have left gaze preference. Able to look to the right past midline. Blinks to threat on the left but not on the right. Right lower facial weakness. Tongue midline. Motor system exam shows dense right hemiplegia with withdrawal in the right lower extremity and none in the right upper extremity on pain stimulation. Has  spontaneous antigravity movements on the left side. Decreased response to pain on the right compared to the left. Gait not tested.    ASSESSMENT/PLAN [name] is a 58 y.o. male with history seizures, hypertension and hyperlipidemia presenting with right-sided weakness, leftward gaze.   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 Etiology likely hypertensive given location and hx of HTN  Serial CT head large L BG ICH w/ edema and mass effect L lat ventricle with L to R shift. trace ICH R lateral ventricle.   CTA head and neck unremarkable  MRI w/w/o large left basal ganglia hemorrhage. No abnormal enhancing lesion or tumor  2D Echo  EF 60-65%  LDL 118   HgbA1c 6.4  Heparin subq for VTE prophylaxis  No antithrombotic prior to admission, now on No antithrombotic given hemorrhage  Therapy recommendations:  CLR. No support at home. Plan SNF.  Disposition:  Pending. Await SNF bed. Medically ready for d/c.  Financials unknown SW clarifying. May be difficult to place.   Cerebral Edema, resolving Induced Hypernatremia-> mild hyponatremia   Treated with 3% protocol in ICU, now off  CT serial showed stable mass effect  Na 139->133  Dysphagia, resolved  Secondary to stroke  Passed MBSS   Now  on pureed thin liquid diet  Hypertension  Stable 140s  Treated with Cleviprex in ICU  Home meds: coreg, avapro, imdur  Restarted avapro alone 2/19  Continue to monitor. If increases, resume coreg  Long-term BP goal normotensive  Hyperlipidemia  Home meds:  zocor 20  LDL 118, goal < 70  Hold statin at present given ICH  Resume on discharge  Seizure history   On Keppra PTA.   Continue keppra 500mg  bid   EEG - focal left hemispheric dysfunction consistent with patient's known ICH. No seizures.  Other Active Problems  Thrombocytopenia, resolved PLT 131->...-> 297  Hyponatremia, resolved Na 136  Hospital day #  14  [name], MSN, APRN, ANVP-BC, AGPCNP-BC Advanced Practice Stroke Nurse [facility] See Amion for Schedule & Pager information 07/31/2018 1:39 PM   ATTENDING NOTE: I reviewed above note and agree with the assessment and plan. Pt was seen and examined.   Pt lying in bed. Awake alert, still has expressive aphasia, not able to name or repeat. Still has right hemiplegia. He has a bed offer and pending his brother to sign for the form. However, we have difficulty to reach his brother. SW and CM are working on that. Continue PT/OT and risk factor modification. BP stable on avapro.   Marvel Plan, MD PhD Stroke Neurology 07/31/2018 2:22 PM     To contact Stroke Continuity provider, please refer to [email or web address]. After hours, contact General Neurology

## 2018-08-01 DIAGNOSIS — I615 Nontraumatic intracerebral hemorrhage, intraventricular: Secondary | ICD-10-CM

## 2018-08-01 DIAGNOSIS — E785 Hyperlipidemia, unspecified: Secondary | ICD-10-CM | POA: Diagnosis present

## 2018-08-01 DIAGNOSIS — I10 Essential (primary) hypertension: Secondary | ICD-10-CM | POA: Diagnosis present

## 2018-08-01 DIAGNOSIS — G40909 Epilepsy, unspecified, not intractable, without status epilepticus: Secondary | ICD-10-CM

## 2018-08-01 LAB — BASIC METABOLIC PANEL
Anion gap: 10 (ref 5–15)
BUN: 11 mg/dL (ref 6–20)
CO2: 23 mmol/L (ref 22–32)
Calcium: 9.5 mg/dL (ref 8.9–10.3)
Chloride: 102 mmol/L (ref 98–111)
Creatinine, Ser: 0.73 mg/dL (ref 0.61–1.24)
GFR calc Af Amer: >60 mL/min (ref >60)
GFR calc non Af Amer: >60 mL/min (ref >60)
Glucose, Bld: 112 mg/dL — ABNORMAL HIGH (ref 70–99)
Potassium: 3.6 mmol/L (ref 3.5–5.1)
SODIUM: 135 mmol/L (ref 135–145)

## 2018-08-01 LAB — CBC
HCT: 39.9 % (ref 39.0–52.0)
Hemoglobin: 13.4 g/dL (ref 13.0–17.0)
MCH: 32 pg (ref 26.0–34.0)
MCHC: 33.6 g/dL (ref 30.0–36.0)
MCV: 95.2 fL (ref 80.0–100.0)
PLATELETS: 322 10*3/uL (ref 150–400)
RBC: 4.19 MIL/uL — ABNORMAL LOW (ref 4.22–5.81)
RDW: 13.2 % (ref 11.5–15.5)
WBC: 6.5 10*3/uL (ref 4.0–10.5)
nRBC: 0 % (ref 0.0–0.2)

## 2018-08-01 NOTE — Discharge Summary (Addendum)
Stroke Discharge Summary  Patient ID: Jamie Stewart   MRN: [id number]      DOB: 08/21/1955  Date of Admission: 07/17/2018 Date of Discharge: 08/01/2018  Attending Physician:  Marvel Plan, MD, Stroke MD Consultant(s):     Faith Rogue, MD (Physical Medicine & Rehabtilitation)  Patient's PCP:  No primary care provider on file.  DISCHARGE DIAGNOSIS:  Principal Problem:   ICH (intracerebral hemorrhage) (HCC) Active Problems:   Cytotoxic brain edema (HCC)   Brain herniation (HCC)   IVH (intraventricular hemorrhage) (HCC)   Essential hypertension   Hyperlipidemia   Seizure disorder (HCC)   History reviewed. No pertinent past medical history. History reviewed. No pertinent surgical history.  Allergies as of 08/01/2018   Not on File     Medication List    STOP taking these medications   aspirin EC 81 MG tablet   carvedilol 25 MG tablet Commonly known as:  COREG   isosorbide mononitrate 30 MG 24 hr tablet Commonly known as:  IMDUR     TAKE these medications   acetaminophen 500 MG tablet Commonly known as:  TYLENOL Take 1,000 mg by mouth every 6 (six) hours as needed for mild pain.   irbesartan 300 MG tablet Commonly known as:  AVAPRO Take 300 mg by mouth daily.   levETIRAcetam 500 MG tablet Commonly known as:  KEPPRA Take 500 mg by mouth 2 (two) times daily.   multivitamin with minerals tablet Take 1 tablet by mouth daily.   simvastatin 20 MG tablet Commonly known as:  ZOCOR Take 20 mg by mouth daily.   vitamin B-12 500 MCG tablet Commonly known as:  CYANOCOBALAMIN Take 500 mcg by mouth daily.       LABORATORY STUDIES CBC    Component Value Date/Time   WBC 6.5 08/01/2018 0505   RBC 4.19 (L) 08/01/2018 0505   HGB 13.4 08/01/2018 0505   HCT 39.9 08/01/2018 0505   PLT 322 08/01/2018 0505   MCV 95.2 08/01/2018 0505   MCH 32.0 08/01/2018 0505   MCHC 33.6 08/01/2018 0505   RDW 13.2 08/01/2018 0505   LYMPHSABS 0.7 07/17/2018 1926   MONOABS 0.5  07/17/2018 1926   EOSABS 0.2 07/17/2018 1926   BASOSABS 0.0 07/17/2018 1926   CMP    Component Value Date/Time   NA 135 08/01/2018 0505   K 3.6 08/01/2018 0505   CL 102 08/01/2018 0505   CO2 23 08/01/2018 0505   GLUCOSE 112 (H) 08/01/2018 0505   BUN 11 08/01/2018 0505   CREATININE 0.73 08/01/2018 0505   CALCIUM 9.5 08/01/2018 0505   PROT 8.4 (H) 07/17/2018 1926   ALBUMIN 4.4 07/17/2018 1926   AST 37 07/17/2018 1926   ALT 34 07/17/2018 1926   ALKPHOS 48 07/17/2018 1926   BILITOT 0.9 07/17/2018 1926   GFRNONAA >60 08/01/2018 0505   GFRAA >60 08/01/2018 0505   COAGS Lab Results  Component Value Date   INR 1.03 07/17/2018   Lipid Panel    Component Value Date/Time   CHOL 266 (H) 07/19/2018 0422   TRIG 340 (H) 07/19/2018 0422   HDL 80 07/19/2018 0422   CHOLHDL 3.3 07/19/2018 0422   VLDL 68 (H) 07/19/2018 0422   LDLCALC 118 (H) 07/19/2018 0422   HgbA1C  Lab Results  Component Value Date   HGBA1C 6.4 (H) 07/19/2018   Urinalysis    Component Value Date/Time   COLORURINE STRAW (A) 07/19/2018 0941   APPEARANCEUR CLEAR 07/19/2018 0941   LABSPEC 1.012  07/19/2018 0941   PHURINE 7.0 07/19/2018 0941   GLUCOSEU 50 (A) 07/19/2018 0941   HGBUR NEGATIVE 07/19/2018 0941   BILIRUBINUR NEGATIVE 07/19/2018 0941   KETONESUR NEGATIVE 07/19/2018 0941   PROTEINUR NEGATIVE 07/19/2018 0941   NITRITE NEGATIVE 07/19/2018 0941   LEUKOCYTESUR NEGATIVE 07/19/2018 0941   Alcohol Level    Component Value Date/Time   ETH 24 (H) 07/17/2018 1926     SIGNIFICANT DIAGNOSTIC STUDIES Ct Head Code Stroke Wo Contrast 07/17/2018 1. Large hematoma centered within the left basal ganglia measuring up to 8.8 cm, 110 cc. Associated edema and mass effect effaces the left lateral ventricle and results in 6 mm of left-to-right midline shift. 2. Trace volume of hemorrhage within the occipital horn of right lateral ventricle. Right lateral ventriculomegaly may be due to ex vacuo dilatation from brain  parenchymal volume loss and chronic right inferolateral frontal lobe infarction or possibly early entrapment, attention at follow-up recommended.   CTA neck 07/17/2018 1. Patent carotid and vertebral arteries. No dissection, aneurysm, or hemodynamically significant stenosis utilizing NASCET criteria. 2. Mild spondylosis of the cervical spine.   CTA head 07/17/2018 1. Patent anterior and posterior intracranial circulation. No large vessel occlusion, aneurysm, vascular malformation, or significant stenosis identified.   Dg Chest 1 View 07/17/2018 No acute cardiopulmonary abnormality seen.   Ct Head Wo Contrast 07/18/2018 1250 1. No increase in volume of large acute intraparenchymal LEFT cerebral hemorrhage with potential mild contraction. 2. No significant change in mass effect with mild (6mm) LEFT to RIGHT midline shift and compression of the LEFT lateral ventricle. 3. No change in small volume intraventricular hemorrhage. 4. Basal cisterns are patent.   Ct Head Wo Contrast 07/18/2018 1509 1. Slight interval contraction of large hematoma centered at the left basal ganglia, now measuring 106 cc, previously 110 cc. Associated intraventricular extension with small volume intraventricular blood, slightly increased from previous. 2. Similar regional mass effect with up to 8 mm left-to-right shift. Slightly worsened dilatation of the temporal horn of the right lateral ventricle, which could reflect developing ventricular trapping. Attention at follow-up. 3. No other new acute intracranial abnormality.   Mr Lodema Pilot Contrast 07/18/2018 1621 1. Unchanged size of large intraparenchymal hematoma centered in the left basal ganglia, most consistent with hypertensive hemorrhage. 2. No abnormal contrast enhancement or other evidence of underlying lesion. 3. Unchanged 5 mm rightward midline shift with intraventricular extension of hemorrhage.     2D Echocardiogram   1. The left ventricle has normal systolic  function with an ejection fraction of 60-65%. The cavity size was normal. Moderate Basal Septal hypertrophy. Left ventricular diastolic Doppler parameters are indeterminate due to nondiagnostic images.  2. The right ventricle has normal systolic function. The cavity was normal. There is no increase in right ventricular wall thickness.  3. The mitral valve is normal in structure.  4. The tricuspid valve is normal in structure.  5. The aortic valve is tricuspid.  6. The pulmonic valve was normal in structure.   HISTORY OF PRESENT ILLNESS Ricahrd Brahmbhatt is a 58 y.o. male with no documented history, based on his medications bottles he has a history of seizures, hypertension and hyperlipidemia. He presented to the emergency room via EMS when his roommate called EMS for the patient being unresponsive and having a leftward gaze.  Stroke alert-LVO positive was activated. Patient is completely aphasic unable to provide any history. According to EMS, the patient has been unwell for past 2 to 3 days but was at least talking  and walking this morning and since 7 AM has been not talking and not been able to walk (LKW 07/17/2018 at 0700, but unclear as he has been sick x 2 days).  When they assessed him first, he was flaccid on the right side as well as had a leftward gaze with no verbal output. Evaluation on arrival to the ED found NIHSS 21 with signs of left cerebral dysfunction. Of note, patient also had a temperature of 101 Fahrenheit.  He had also been incontinent of urine. Premorbid modified Rankin scale (mRS): Unknown on arrival. CT showed a large L ICH w/ IVH. BP 154/100. He was admitted to the neuro ICU.   HOSPITAL COURSE Mr. Jesi Bosserman is a 58 y.o. male with history seizures, hypertension and hyperlipidemia presenting with right-sided weakness, leftward gaze.   Stroke:  left large basal ganglia and corona radiata ICH w/ IVH and cerebral edema w/ midline shift. Etiology likely hypertensive given hmg  location and hx of HTN  Serial CT head large L BG ICH w/ edema and mass effect L lat ventricle with L to R shift. trace ICH R lateral ventricle.   CTA head and neck unremarkable  MRI w/w/o large left basal ganglia hemorrhage. No abnormal enhancing lesion or tumor  2D Echo  EF 60-65%  LDL 118   HgbA1c 6.4  No antithrombotic prior to admission, now on No antithrombotic given hemorrhage  Therapy recommendations:  CLR. No support at home. Plan SNF.  Disposition:  SNF   Cerebral Edema, resolving Induced Hypernatremia-> mild hyponatremia   Treated with 3% protocol in ICU, now off  CT serial showed stable mass effect  Na 139->133  Dysphagia, resolved  Secondary to stroke  Passed MBSS   Now on pureed thin liquid diet  Hypertension  Treated with Cleviprex in ICU  Home meds: coreg, avapro, imdur  Restarted avapro alone 2/19  Stable 110-130s at d/c  BP goal normotensive  Continue to monitor. If increases, resume coreg  Hyperlipidemia  Home meds:  zocor 20  LDL 118, goal < 70  Hold statin inpatient given ICH  Resume statin at discharge  Seizure history   On Keppra PTA.   Continued keppra 500mg  bid   EEG - focal left hemispheric dysfunction consistent with patient's known ICH. No seizures.  Other Active Problems  Thrombocytopenia, resolved PLT 131->...-> 297  Hyponatremia, resolved Na 136   DISCHARGE EXAM Blood pressure (!) 129/95, pulse 76, temperature 98.2 F (36.8 C), temperature source Oral, resp. rate 18, weight 94.3 kg, SpO2 96 %. Obese middle-aged African-American male in no acute distress. Afebrile. Head is nontraumatic. Neck is supple without bruit. Cardiac exam no murmur or gallop. Lungs are clear to auscultation. Distal pulses are well felt.  Neurological Exam  Awake alert, expressive greater than receptive aphasia. Speaks a few words and occasional short sentences.  Follows most simple commands, can show 2 fingers but difficulty  with 3-step commands. Names 0/3 today, able to repeat 3 word sentences, but dysarthria. Able to follow gaze in all directions but does have left gaze preference. Able to look to the right past midline. Blinks to threat on the left but not on the right. Right lower facial weakness. Tongue midline. Motor system exam shows dense right hemiplegia with brisk withdrawal in the right lower extremity and none in the right upper extremity on pain stimulation. Moves L side independently, 4+/5. Decreased response to pain on the right compared to the left. Gait not tested.   Discharge Diet  Dysphagia 2 thin liquids  DISCHARGE PLAN  Disposition:  Skilled nursing facility for ongoing PT, OT and ST.   Due to hemorrhage and risk of bleeding, do not take aspirin, aspirin-containing medications, or ibuprofen products   Ongoing risk factor control by Primary Care Physician at time of discharge  Follow-up primary care provider at SNF within 2 weeks.  Follow-up in [facility] in 4 weeks, office to schedule an appointment.   30 minutes were spent preparing discharge.  [name], MSN, APRN, ANVP-BC, AGPCNP-BC Advanced Practice Stroke Nurse [facility] See Amion for Schedule & Pager information 08/01/2018 1:27 PM   ATTENDING NOTE: I reviewed above note and agree with the assessment and plan. Pt was seen and examined.   No acute event overnight, brother at bedside, still has expressive aphasia and right hemiplegia.  BP stable on Avapro.  Continue Keppra for seizure prevention.  Patient had bed available at SNF, will discharge to SNF for further rehab.  Patient will follow-up with stroke clinic at [facility] in 4 weeks.  Marvel Plan, MD PhD Stroke Neurology 08/01/2018 6:17 PM

## 2018-08-01 NOTE — Clinical Social Work Placement (Signed)
Nurse to call report to [phone]     CLINICAL SOCIAL WORK PLACEMENT  NOTE  Date:  08/01/2018  Patient Details  Name: Jamie Stewart MRN: [id number] Date of Birth: 08/21/1955  Clinical Social Work is seeking post-discharge placement for this patient at the Skilled  Nursing Facility level of care (*CSW will initial, date and re-position this form in  chart as items are completed):  Yes   Patient/family provided with [facility] Clinical Social Work Department's list of facilities offering this level of care within the geographic area requested by the patient (or if unable, by the patient's family).  Yes   Patient/family informed of their freedom to choose among providers that offer the needed level of care, that participate in Medicare, Medicaid or managed care program needed by the patient, have an available bed and are willing to accept the patient.  Yes   Patient/family informed of [facility]'s ownership interest in [facility] and [facility], as well as of the fact that they are under no obligation to receive care at these facilities.  PASRR submitted to EDS on       PASRR number received on       Existing PASRR number confirmed on       FL2 transmitted to all facilities in geographic area requested by pt/family on       FL2 transmitted to all facilities within larger geographic area on       Patient informed that his/her managed care company has contracts with or will negotiate with certain facilities, including the following:        Yes   Patient/family informed of bed offers received.  Patient chooses bed at [facility])     Physician recommends and patient chooses bed at      [facility] to be [facility] to [facility] on 08/01/18.  Patient to be transferred to facility by [facility]     Patient family notified on 08/01/18 of transfer.  Name of family member notified:  [name]     PHYSICIAN       Additional Comment:     _______________________________________________ Baldemar Lenis, LCSW 08/01/2018, 2:00 PM

## 2018-08-01 NOTE — Progress Notes (Addendum)
  Speech Language Pathology Treatment: Dysphagia;Cognitive-Linquistic  Patient Details Name: Jamie Stewart MRN: [id number] DOB: 08/21/1955 Today's Date: 08/01/2018 Time: 4008-6761 SLP Time Calculation (min) (ACUTE ONLY): 23 min  Assessment / Plan / Recommendation Clinical Impression  Pt seen this am for diet tolerance/advancement and aphasia/cognitive tx. Only a few bites of puree and sips of thin liquid observed as pt did not have an appetite for breakfast this am. He displayed tolerance of puree and thin liquid requiring max verbal/tactile cues to take small sips. Recommend dys 2, thin liquid diet with full supervision to cue for compensatory strategies (lingual sweep in R buccal cavity, small sips). Will f/u for tolerance and provide cues for strategies as needed.  Aphasia and cognitive tx focused primarily on verbal expression of wants/needs. During meal intake pt required max gestural and question cues ("what do you want/need?") followed by a verbal model to express wants/needs. Repetitions of verbal word and phrase models c/b low vocal intensity, non-fluent/halting and paraphasic speech. During requested oral care, pt displayed poor problem solving ability and required hand-over-hand assist, max verbal and tactile cues to open toothpaste. Continued max cueing required to direct awareness to errors in pt's oral care process and direct attention to oral care objects in R visual field. Pt named tangible and pictured objects given max semantic, phonemic and gestural cues. He perseverated on "bacon" and required verbal cues x3 to redirect. Recommend aphasia/cognitive tx to target naming, verbal initiation of wants/needs, attention and awareness.   HPI HPI: Ho Jamie Stewart is a 58 y.o. male with no documented history, based on his medications bottles, history of seizures and hypertension hyperlipidemia, presented to the emergency with unresponsiveness and left gaze. Per chart he is completely aphasic  unable to provide any history. CT large hematoma centered within the left basal ganglia measuring up to 8.8 cm, 110 cc. Associated edema and mass effect effaces the left lateral ventricle, trace volume of hemorrhage within the occipital horn of right lateral ventricle. Right lateral ventriculomegaly may be due to ex vacuo dilatation from brain parenchymal volume loss and chronic right inferolateral frontal lobe infarction or possibly early entrapment, attention at follow-up recommended. CXR No acute cardiopulmonary abnormality seen. Initial MBS 07/18/18 recommended DYs 1, nectar. MBS repeated to determine recommendation for upgrade textures/liquids       SLP Plan  Continue with current plan of care       Recommendations  Diet recommendations: Dysphagia 2 (fine chop);Thin liquid Liquids provided via: Straw;Cup Medication Administration: Crushed with puree Supervision: Full supervision/cueing for compensatory strategies Compensations: Minimize environmental distractions;Slow rate;Small sips/bites;Lingual sweep for clearance of pocketing Postural Changes and/or Swallow Maneuvers: Seated upright 90 degrees                Oral Care Recommendations: Oral care BID Follow up Recommendations: Skilled Nursing facility SLP Visit Diagnosis: Dysphagia, oropharyngeal phase (R13.12) Plan: Continue with current plan of care       GO                Gardiner Ramus, Student SLP 08/01/2018, 9:54 AM

## 2018-08-04 ENCOUNTER — Encounter (HOSPITAL_COMMUNITY): Payer: Self-pay | Admitting: Emergency Medicine

## 2018-08-24 IMAGING — CT CT ANGIO CHEST
2 of 7 series · 18 of 46 positions shown · IV contrast (APPLIED)
Comparison: 12/07/2013

CLINICAL DATA: Chest pain that radiates into the shoulder blades
since yesterday. Pulmonary embolism suspected.

EXAM:
CT ANGIOGRAPHY CHEST WITH CONTRAST
TECHNIQUE: Multidetector CT imaging of the chest was performed using the
standard protocol during bolus administration of intravenous
contrast. Multiplanar CT image reconstructions and MIPs were
obtained to evaluate the vascular anatomy.
CONTRAST:  100mL KFPO8S-6WB IOPAMIDOL (KFPO8S-6WB) INJECTION 76%

[Series 8: thins · axial · 0.75mm/px · z∈[+1153,+1399]mm · 15 of 397 slices shown]
[im 23/397  lung]
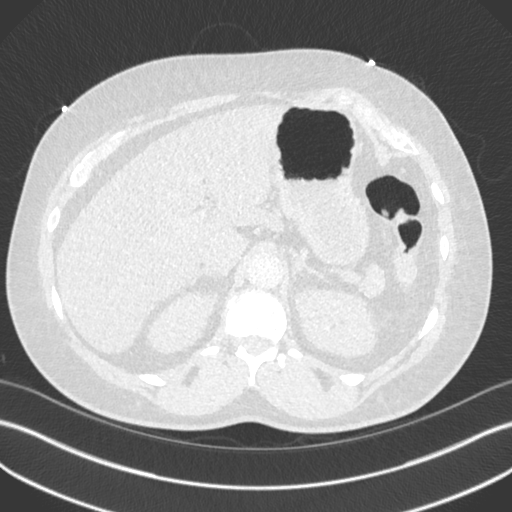
[im 45/397  soft-tissue]
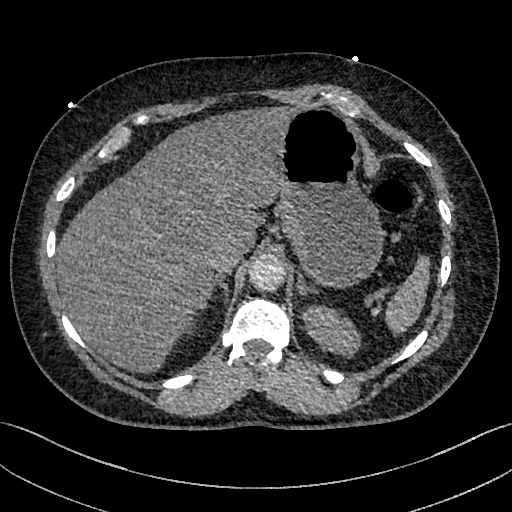
[im 67/397  lung]
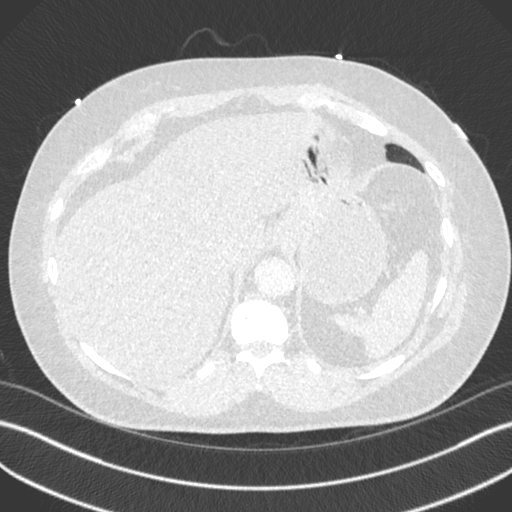
[im 89/397  soft-tissue]
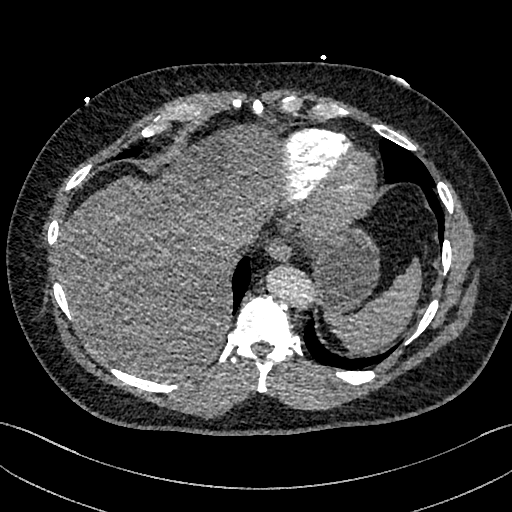
[im 133/397  lung]
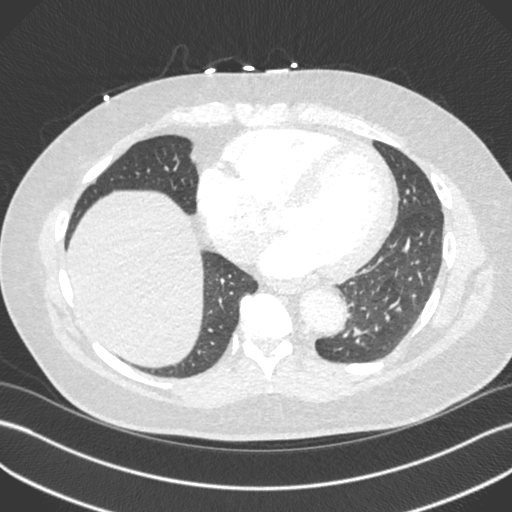
[im 155/397  soft-tissue]
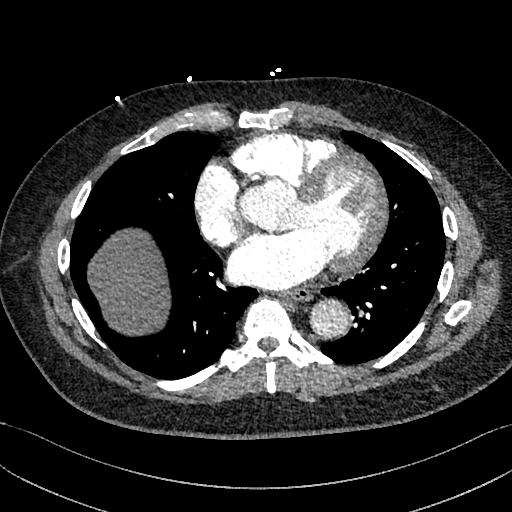
[im 177/397  lung]
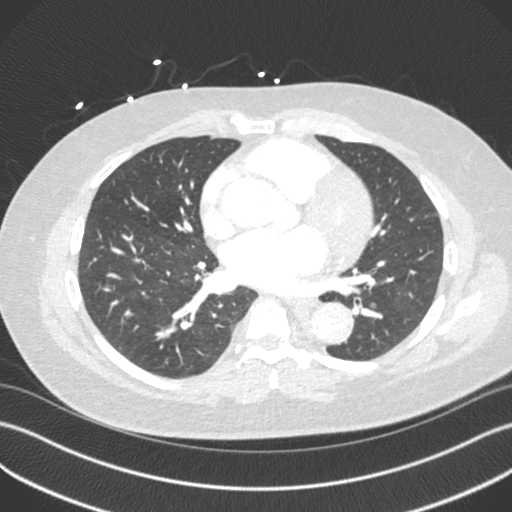
[im 199/397  soft-tissue]
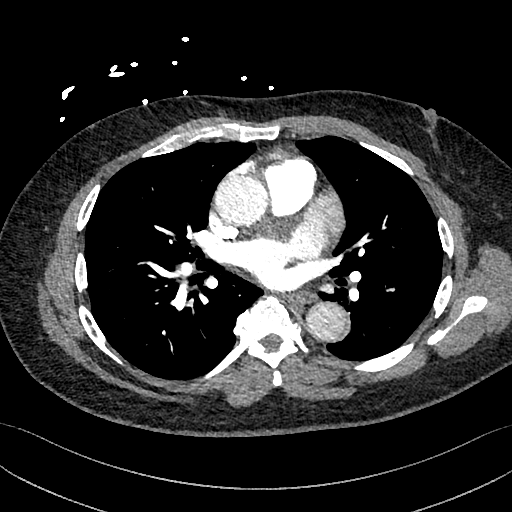
[im 221/397  lung]
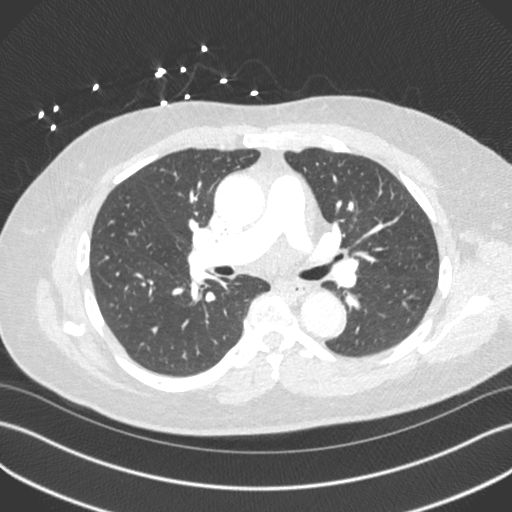
[im 243/397  soft-tissue]
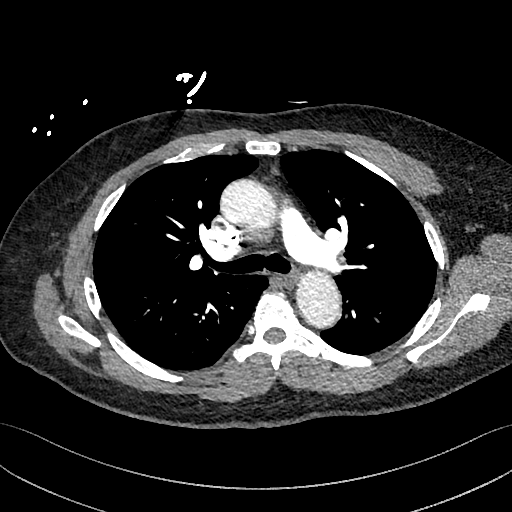
[im 265/397  lung]
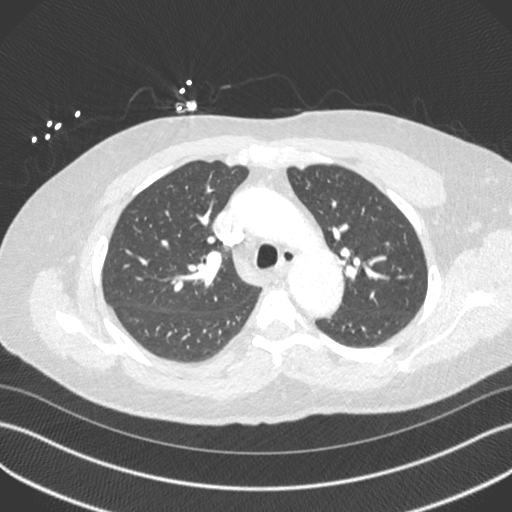
[im 309/397  soft-tissue]
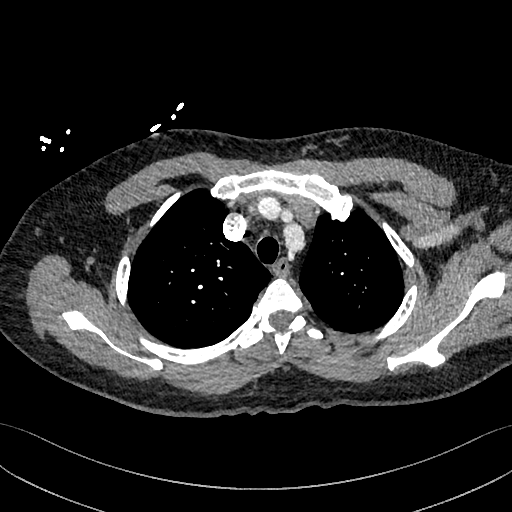
[im 331/397  lung]
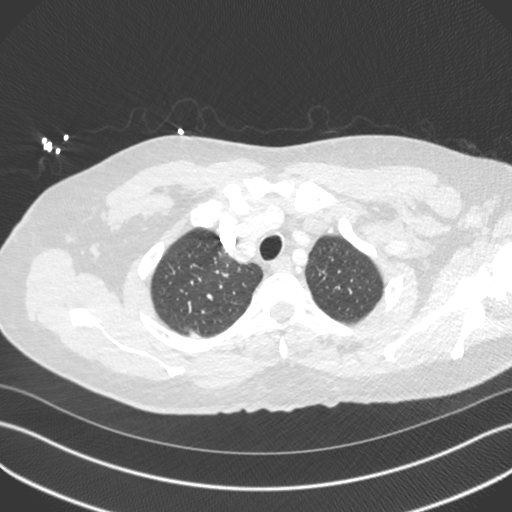
[im 353/397  soft-tissue]
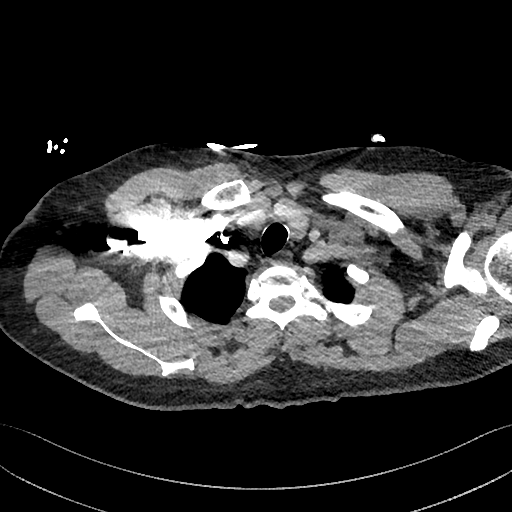
[im 375/397  lung]
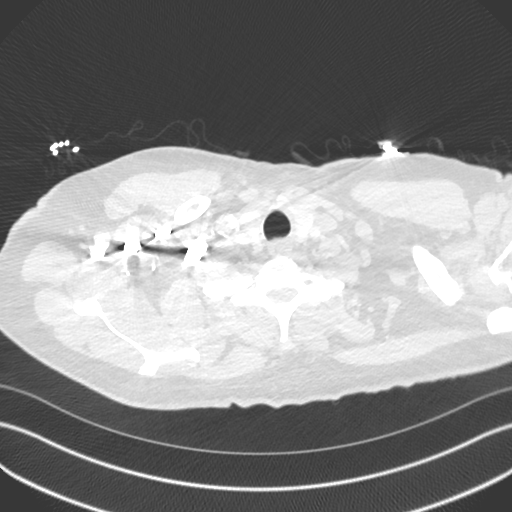

[Series 9: cor · coronal · 0.54mm/px · 3 of 132 slices shown]
[im 33/132  soft-tissue]
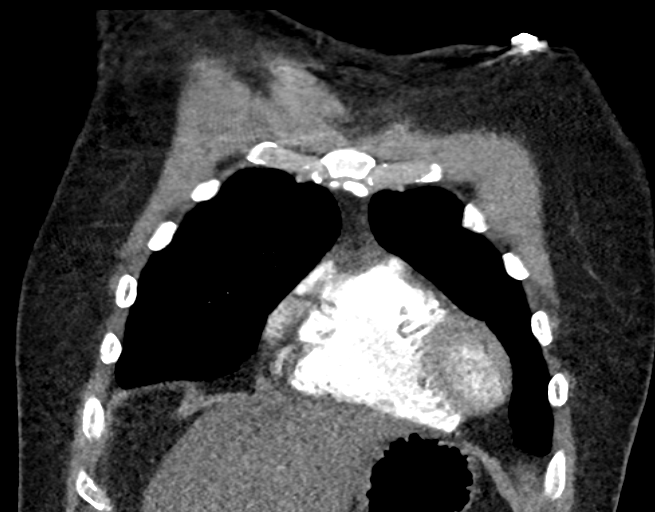
[im 66/132  soft-tissue]
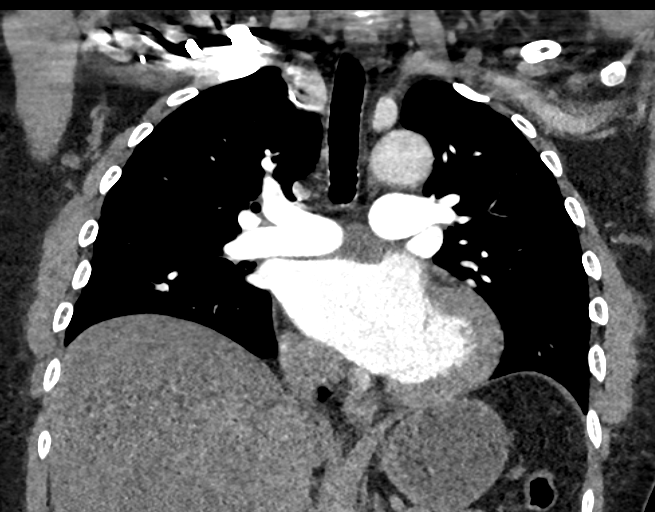
[im 99/132  soft-tissue]
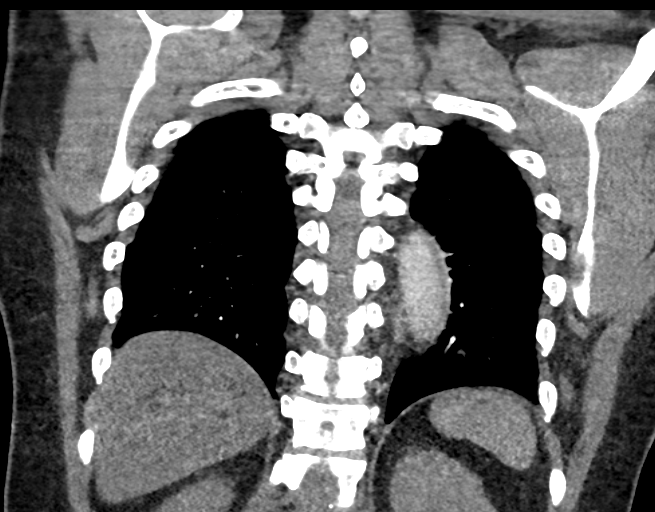

[18 of 46 positions shown; findings below may reference images not displayed]

FINDINGS: Cardiovascular: Satisfactory opacification of the pulmonary arteries
to the segmental level. No evidence of pulmonary embolism. No
cardiomegaly. No pericardial effusion. Tortuous aorta with ductus
bump. Wall thickening of the descending aorta is stable from prior
and attributed atherosclerosis.

Mediastinum/Nodes: Negative for adenopathy or mass.

Lungs/Pleura: Lungs are clear. No pleural effusion or pneumothorax.

Upper Abdomen: No explanation for pain.

Musculoskeletal: No acute or aggressive finding

Review of the MIP images confirms the above findings.
IMPRESSION: Negative for pulmonary embolism or other acute finding.

## 2018-08-24 IMAGING — CT CT HEAD W/O CM
3 series · 15 of 47 positions shown, 18 images · non-contrast
Comparison: 09/24/2013

CLINICAL DATA: Seizure, nontraumatic.  Ethanol withdrawal.

EXAM:
CT HEAD WITHOUT CONTRAST
TECHNIQUE: Contiguous axial images were obtained from the base of the skull
through the vertex without intravenous contrast.

[Series 3: head 5.0 h30s · axial · 0.46mm/px · z∈[-124,+26]mm · 9 of 36 slices shown, 12 images]
[im 3/36  brain]
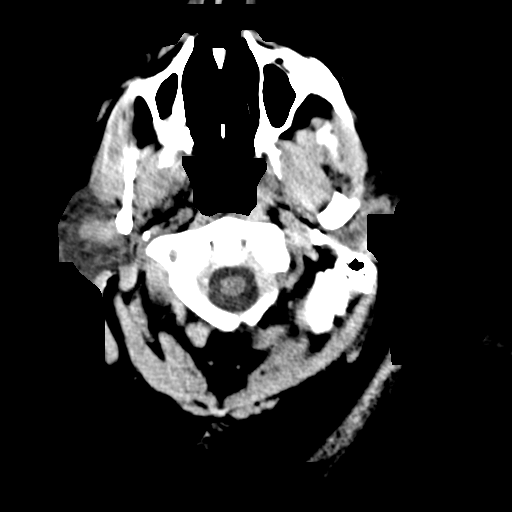
[im 3/36  bone]
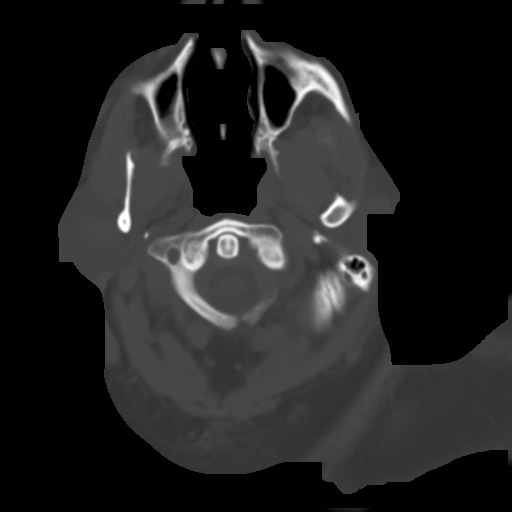
[im 7/36  brain]
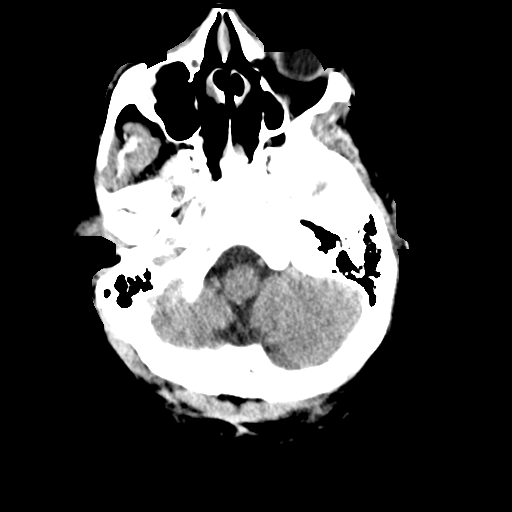
[im 10/36  brain]
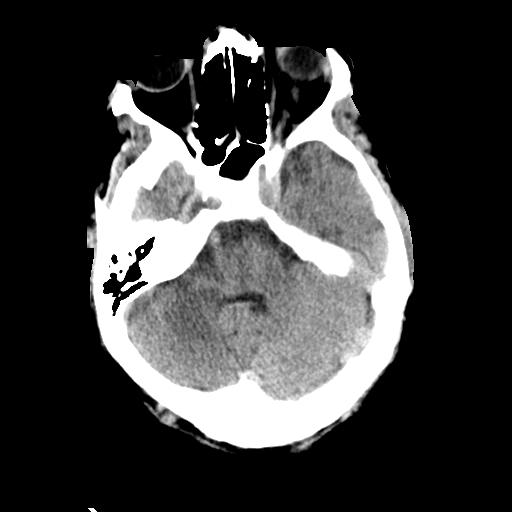
[im 14/36  brain]
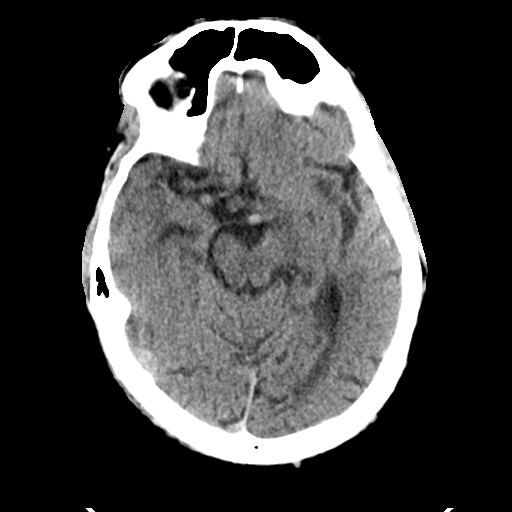
[im 19/36  brain]
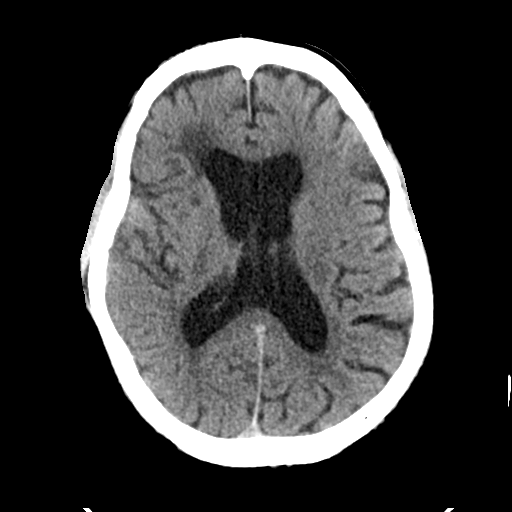
[im 19/36  bone]
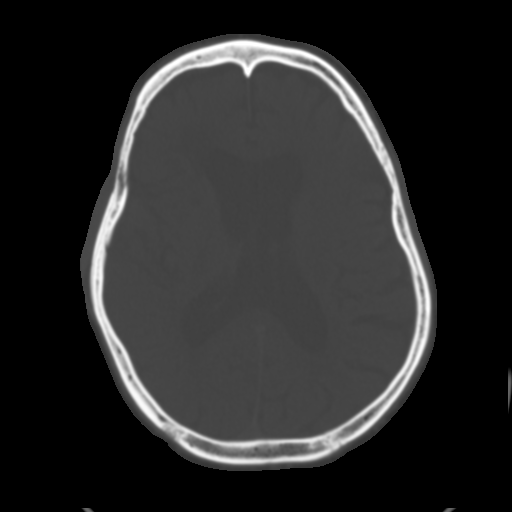
[im 22/36  brain]
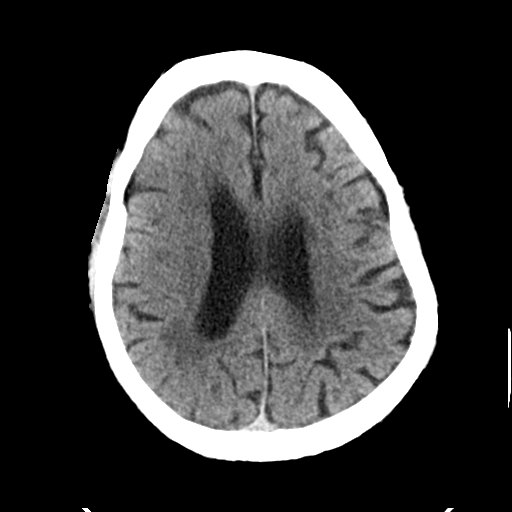
[im 26/36  brain]
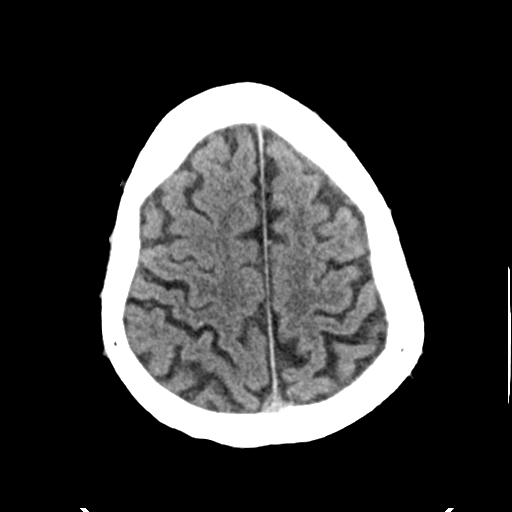
[im 29/36  brain]
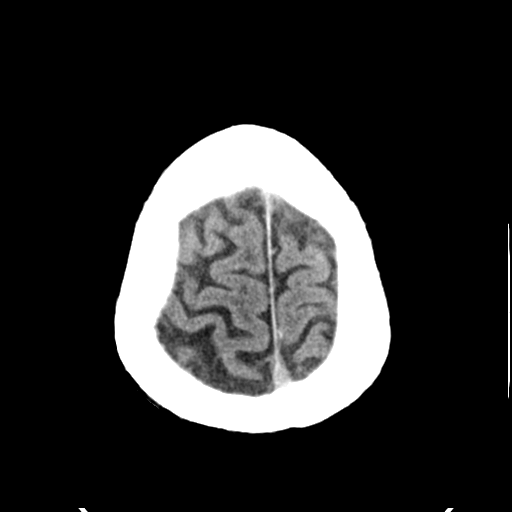
[im 33/36  brain]
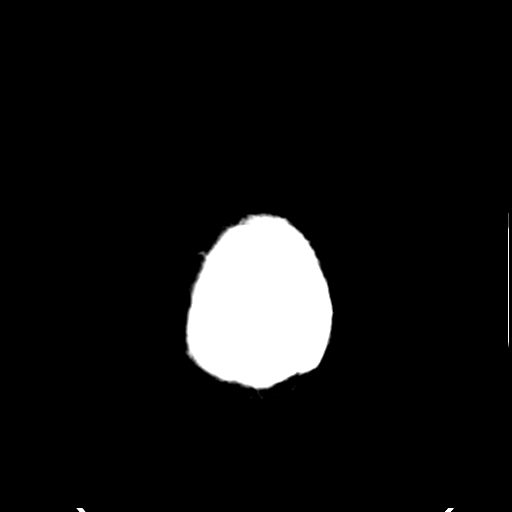
[im 33/36  bone]
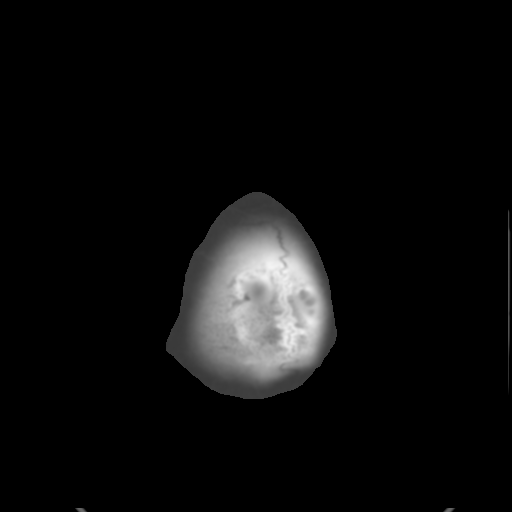

[Series 5: head 3.0 mpr cor · coronal · 0.34mm/px · 3 of 79 slices shown]
[im 27/79  brain]
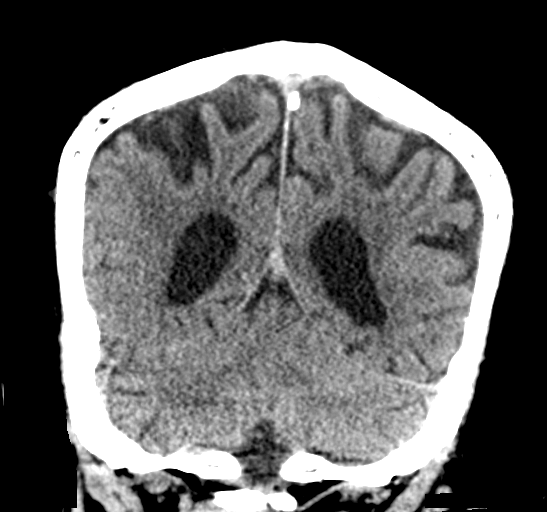
[im 35/79  brain]
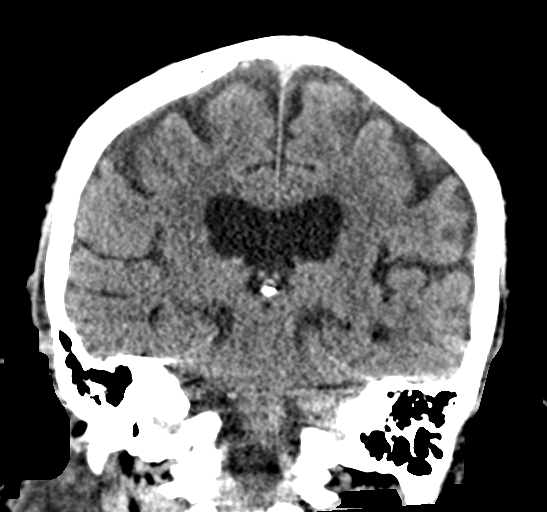
[im 44/79  brain]
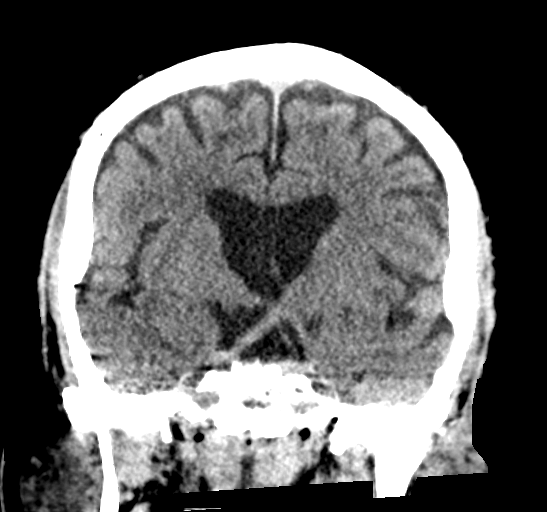

[Series 6: head 3.0 mpr sag · sagittal · 0.34mm/px · 3 of 67 slices shown]
[im 23/67  brain]
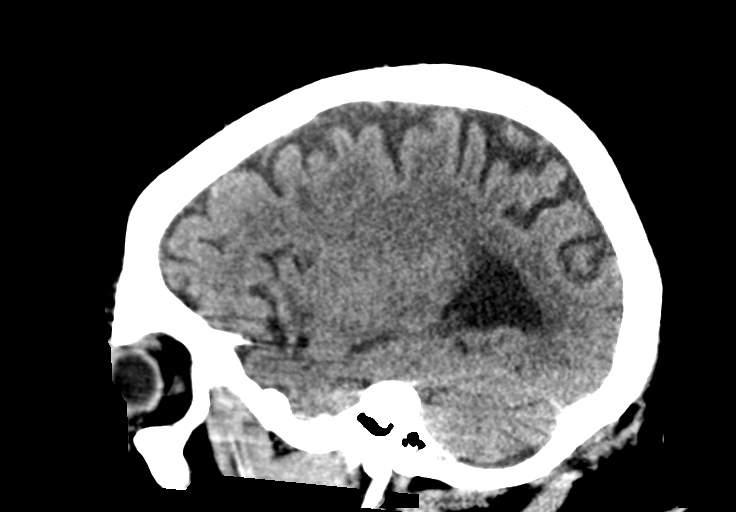
[im 34/67  brain]
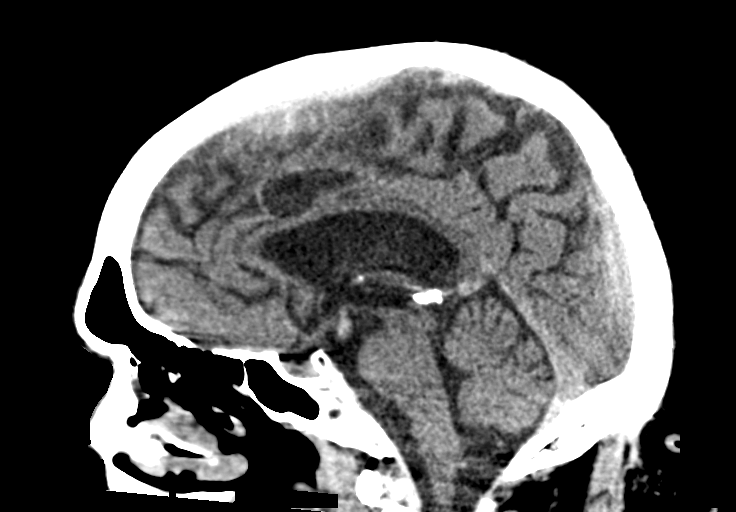
[im 45/67  brain]
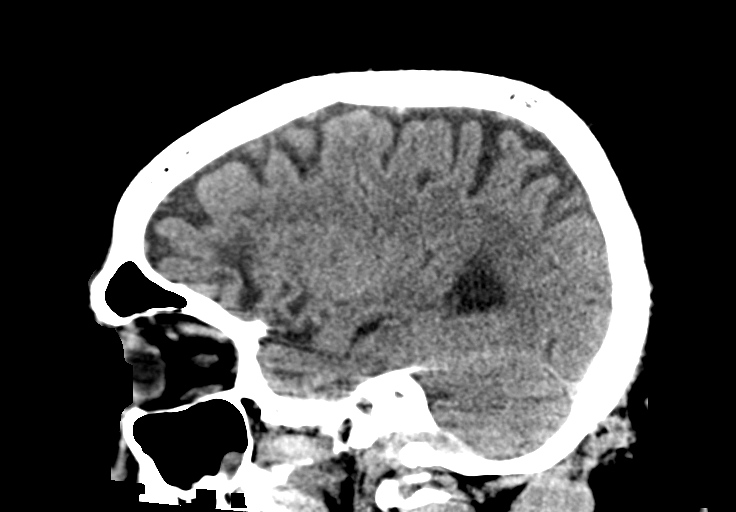

[15 of 47 positions shown; findings below may reference images not displayed]

FINDINGS: Brain: No evidence of acute infarction, hemorrhage, hydrocephalus,
extra-axial collection or mass lesion/mass effect. Moderate area of
encephalomalacia in the inferior right frontal lobe that is stable
from 3208. This could be post ischemic or posttraumatic in this
location. Mild white matter disease was also seen on prior, likely
microvascular ischemic given patient's medical history. Age advanced
cerebral volume loss.

Vascular: Mild arterial calcification.  No hyperdense vessel.

Skull: No acute or aggressive finding.

Sinuses/Orbits: Polypoid densities in the bilateral nasal cavity
IMPRESSION: 1. No acute finding.
2. Chronic moderate right inferior frontal encephalomalacia that
involves cortex.
3. Mild chronic white matter disease.
4. Chronic bilateral nasal cavity polyps.

## 2018-09-04 ENCOUNTER — Telehealth: Payer: Self-pay

## 2018-09-04 NOTE — Telephone Encounter (Signed)
If nursing home rep calls back r/s pt towards the end of June or July. Thanks   called [facility] at [phone]. I ask for Jolene who is the scheduler. She was not available. I advise pt has appt with Shanda Bumps NP on 09/09/2018 at 0815 for hospital follow up. I stated to receptionist  due to COVID 19 it will be cancel due to minimize risk for everyone. I was transfer to Jolene vm. I left vm stating the above reason for cancelling appt and to call back to r/s appt in 3 months.

## 2018-09-09 ENCOUNTER — Inpatient Hospital Stay: Payer: Self-pay | Admitting: Adult Health

## 2018-11-26 NOTE — Telephone Encounter (Signed)
I call [facility] and spoke to Karna Christmas who will be doing video visit with pt on Monday with Janett Billow NP. I was given email of [email or web address] to send link to. Karna Christmas will be doing visit with the facility I pad. She knows to log on ten minutes early for appt. She will call back if she does not have link. I gave gna number if she did not receive link.

## 2018-12-01 ENCOUNTER — Other Ambulatory Visit: Payer: Self-pay

## 2018-12-01 ENCOUNTER — Encounter: Payer: Self-pay | Admitting: Adult Health

## 2018-12-01 ENCOUNTER — Ambulatory Visit (INDEPENDENT_AMBULATORY_CARE_PROVIDER_SITE_OTHER): Payer: Medicaid Other | Admitting: Adult Health

## 2018-12-01 VITALS — BP 133/88 | HR 90 | Temp 97.5°F

## 2018-12-01 DIAGNOSIS — I610 Nontraumatic intracerebral hemorrhage in hemisphere, subcortical: Secondary | ICD-10-CM | POA: Diagnosis not present

## 2018-12-01 DIAGNOSIS — I615 Nontraumatic intracerebral hemorrhage, intraventricular: Secondary | ICD-10-CM

## 2018-12-01 DIAGNOSIS — G8191 Hemiplegia, unspecified affecting right dominant side: Secondary | ICD-10-CM

## 2018-12-01 DIAGNOSIS — R4701 Aphasia: Secondary | ICD-10-CM

## 2018-12-01 DIAGNOSIS — I10 Essential (primary) hypertension: Secondary | ICD-10-CM | POA: Diagnosis not present

## 2018-12-01 DIAGNOSIS — G40909 Epilepsy, unspecified, not intractable, without status epilepticus: Secondary | ICD-10-CM | POA: Diagnosis not present

## 2018-12-01 DIAGNOSIS — E785 Hyperlipidemia, unspecified: Secondary | ICD-10-CM
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
## 2018-12-02 NOTE — Progress Notes (Signed)
I agree with the above plan 

## 2019-05-29 ENCOUNTER — Emergency Department (HOSPITAL_COMMUNITY)
Admission: EM | Admit: 2019-05-29 | Discharge: 2019-05-29 | Disposition: A | Discharge status: Home / Self Care | Payer: Medicaid Other | Attending: Emergency Medicine | Admitting: Emergency Medicine

## 2019-05-29 ENCOUNTER — Emergency Department (HOSPITAL_COMMUNITY): Payer: Medicaid Other

## 2019-05-29 ENCOUNTER — Other Ambulatory Visit: Payer: Self-pay

## 2019-05-29 DIAGNOSIS — Y929 Unspecified place or not applicable: Secondary | ICD-10-CM | POA: Insufficient documentation

## 2019-05-29 DIAGNOSIS — W19XXXA Unspecified fall, initial encounter: Secondary | ICD-10-CM

## 2019-05-29 DIAGNOSIS — Z87891 Personal history of nicotine dependence: Secondary | ICD-10-CM | POA: Diagnosis not present

## 2019-05-29 DIAGNOSIS — Y999 Unspecified external cause status: Secondary | ICD-10-CM | POA: Insufficient documentation

## 2019-05-29 DIAGNOSIS — Z7982 Long term (current) use of aspirin: Secondary | ICD-10-CM | POA: Insufficient documentation

## 2019-05-29 DIAGNOSIS — W06XXXA Fall from bed, initial encounter: Secondary | ICD-10-CM | POA: Insufficient documentation

## 2019-05-29 DIAGNOSIS — I10 Essential (primary) hypertension: Secondary | ICD-10-CM | POA: Insufficient documentation

## 2019-05-29 DIAGNOSIS — Z79899 Other long term (current) drug therapy: Secondary | ICD-10-CM | POA: Insufficient documentation

## 2019-05-29 DIAGNOSIS — Y939 Activity, unspecified: Secondary | ICD-10-CM | POA: Insufficient documentation

## 2019-05-29 DIAGNOSIS — S098XXA Other specified injuries of head, initial encounter: Secondary | ICD-10-CM | POA: Diagnosis not present

## 2019-05-29 DIAGNOSIS — G40909 Epilepsy, unspecified, not intractable, without status epilepticus: Secondary | ICD-10-CM | POA: Diagnosis not present

## 2019-05-29 DIAGNOSIS — S0990XA Unspecified injury of head, initial encounter: Secondary | ICD-10-CM

## 2019-05-29 NOTE — ED Notes (Signed)
Attempted X 2 to call Jamie Stewart to give report but no one answer the phone.

## 2019-05-29 NOTE — ED Provider Notes (Addendum)
Care assumed from Roxy Horseman, PA-C, at shift change, please see their notes for full documentation of patient's complaint/HPI. Briefly, pt here with complaint of fall; pt from [place] and was seen rolling out of bed and hitting head. No LOC. Awaiting CT Head, CT C spine, and CT L spine. Plan is to dispo accordingly.   Physical Exam  BP (!) 125/102   Pulse 71   Temp 97.6 F (36.4 C) (Oral)   Resp 18   SpO2 96%   Physical Exam Vitals and nursing note reviewed.  Constitutional:      Appearance: He is not diaphoretic.     Comments: Cachectic appearing male  HENT:     Head:     Comments: Large hematoma noted to right frontal region with TTP. No deformities to scalp. No raccoon's sign or battle's sign. Negative hemotympanum bilaterally.  Eyes:     Extraocular Movements: Extraocular movements intact.     Conjunctiva/sclera: Conjunctivae normal.     Pupils: Pupils are equal, round, and reactive to light.  Neck:     Comments: C collar in place; removed Cardiovascular:     Rate and Rhythm: Normal rate and regular rhythm.     Pulses: Normal pulses.  Pulmonary:     Effort: Pulmonary effort is normal.     Breath sounds: Normal breath sounds. No wheezing, rhonchi or rales.  Abdominal:     Palpations: Abdomen is soft.     Tenderness: There is no abdominal tenderness. There is no guarding or rebound.  Musculoskeletal:     Cervical back: Neck supple.  Skin:    General: Skin is warm and dry.  Neurological:     Mental Status: He is alert.     GCS: GCS eye subscore is 4. GCS verbal subscore is 5. GCS motor subscore is 6.     Comments: Expressive aphasia noted. Pt alert and oriented to self and place. He believes the year is 6. Residual weakness noted to RUE and RLE although pt able to move them without difficulty. Strength 5/5 to left side upper and lower extremity.      ED Course/Procedures   Clinical Course as of May 28 1557  Fri May 29, 2019  3903 Discussed case with Dr.  Franky Macho who will come evaluate patient and look over imaging   [MV]    Clinical Course User Index [MV] Tanda Rockers, PA-C    Procedures   CT Head Wo Contrast  Result Date: 05/29/2019 CLINICAL DATA:  58 year old male status post fall from bed with pain. EXAM: CT HEAD WITHOUT CONTRAST TECHNIQUE: Contiguous axial images were obtained from the base of the skull through the vertex without intravenous contrast. COMPARISON:  Head CT 05/07/2017.  Brain MRI 09/25/2013. FINDINGS: Brain: Confluent encephalomalacia in the left MCA territory and thalamus is new since 2018, with ex vacuo ventricular enlargement since that time. Superimposed streaky hyperdensity along the left corona radiata and deep white matter capsule (series 3, images 17-19). No associated mass effect is evident. No intraventricular blood or evidence of acute intracranial hemorrhage elsewhere. Right inferior frontal gyrus encephalomalacia is stable since 2018. Brainstem and cerebellum appear stable. Basilar cisterns are normal. No definite acute cortically based infarct. No extra-axial blood or fluid collection identified. Vascular: Calcified atherosclerosis at the skull base. No suspicious intracranial vascular hyperdensity. Skull: No acute osseous abnormality identified. Congenital incomplete ossification of the posterior C1 ring. Sinuses/Orbits: Low-density fluid and mucosal thickening in the maxillary and sphenoid sinuses appears to be inflammatory. The  frontal sinuses, tympanic cavities and mastoids remain clear. Small volume retained secretions in the nasopharynx. Evidence of multiple sinonasal type polyps in the nasal cavity. Other: Right frontal convexity broad-based scalp hematoma measuring up to 10 millimeters in thickness. Underlying right frontal bone appears intact. Disconjugate gaze. No other acute orbit or scalp soft tissue finding. IMPRESSION: 1. Evidence of small volume hyperdense blood products along the left corona radiata  and external capsule, although favor this is Chronic and/or dystrophic calcification in light of extensive regional left hemisphere encephalomalacia which is new from the 2018 comparison. Stability on a repeat head CT in 12-24 hours would be reassuring. Otherwise susceptibility weighted imaging on non-contrast Brain MRI might confirm blood versus calcium (but SWI is not currently available at [facility]). 2. No other acute intracranial abnormality identified. Chronic right MCA encephalomalacia is stable. 3. Right frontal scalp hematoma with no underlying skull fracture. 4. Inflammatory appearing paranasal sinus disease, nasal polyposis. Electronically Signed   By: Genevie Ann M.D.   On: 05/29/2019 06:48   CT Cervical Spine Wo Contrast  Result Date: 05/29/2019 CLINICAL DATA:  58 year old male status post fall from bed with pain. EXAM: CT CERVICAL SPINE WITHOUT CONTRAST TECHNIQUE: Multidetector CT imaging of the cervical spine was performed without intravenous contrast. Multiplanar CT image reconstructions were also generated. COMPARISON:  None. FINDINGS: Alignment: Mild straightening of cervical lordosis. Mild levoconvex scoliosis. Cervicothoracic junction alignment is within normal limits. Bilateral posterior element alignment is within normal limits. Skull base and vertebrae: Visualized skull base is intact. No atlanto-occipital dissociation. Congenital incomplete ossification of the posterior C1 ring. No acute osseous abnormality identified. Soft tissues and spinal canal: No prevertebral fluid or swelling. No visible canal hematoma. Calcified carotid atherosclerosis on the right otherwise negative visible noncontrast neck soft tissues. Disc levels: Widespread chronic cervical disc and endplate degeneration, although no significant cervical spinal stenosis suspected. Upper chest: Negative lung apices. Mild thyroid parenchyma heterogeneity at the thoracic inlet. Visible upper thoracic levels appear  intact. IMPRESSION: No acute traumatic injury identified in the cervical spine. Electronically Signed   By: Genevie Ann M.D.   On: 05/29/2019 06:51   CT Lumbar Spine Wo Contrast  Result Date: 05/29/2019 CLINICAL DATA:  58 year old male status post fall from bed with pain. EXAM: CT LUMBAR SPINE WITHOUT CONTRAST TECHNIQUE: Multidetector CT imaging of the lumbar spine was performed without intravenous contrast administration. Multiplanar CT image reconstructions were also generated. COMPARISON:  CT Abdomen and Pelvis 12/02/2011 FINDINGS: Segmentation: Normal. Alignment: Stable lumbar vertebral alignment since 2013. Mild straightening of lordosis. Vertebrae: Stable bone mineralization. The visible lower thoracic levels through L4 appear intact. There is motion artifact at the L5 vertebral level, evident in the iliac wings also at that level (series 3, image 113). The L5 vertebral body height is unchanged since 2013 and there is no convincing L5 fracture. Visible sacrum and SI joints appear intact. No acute osseous abnormality identified. Paraspinal and other soft tissues: Chronic nephrolithiasis, up to 9 millimeter calculi in the right renal lower pole, increased since 2013. Otherwise negative visible noncontrast abdominal viscera. Paraspinal soft tissues are within normal limits. Disc levels: Mild for age lumbar spine degeneration appears stable since the 2013 CT. There is chronic L5-S1 disc degeneration, but capacious lumbar spinal canal without evidence of spinal stenosis. IMPRESSION: 1. No acute osseous abnormality in the lumbar spine; motion artifact at the L5 vertebral level. 2. Generally mild for age lumbar spine degeneration. Chronic disc and endplate degeneration at L5-S1. 3. Chronic nephrolithiasis  has progressed in the right kidney since 2013. Electronically Signed   By: Odessa FlemingH  Hall M.D.   On: 05/29/2019 06:56    MDM  CT Head with concern for blood products vs chronic/calcification. Radiologist recommends  repeat CT in 12-24 hours for stability purposes vs SWI MRI although we do not have this capability at the current facility. Will consult neurosurgery for further recommendations given findings. Unfortunately with pt's expressive aphasia it is difficult to perform proper neuro exam.   Dr. Franky Machoabbell has evaluated CT scan himself and does not believe it is from pt's fall. Does not recommend repeat CT scan at this time. Pt is back at baseline; feel he is safe for discharge home at this time. See Dr. Sueanne Margaritaabbell's note.          Tanda RockersVenter, Myleen Brailsford, PA-C 05/29/19 1558    Derwood KaplanNanavati, Ankit, MD 05/30/19 [phone]

## 2019-05-29 NOTE — ED Notes (Addendum)
Pt brother, Kendall Justo, called for information regarding pt. I went to pt's room and asked him if I could discuss his care with his brother and he said yes. Updated Janeann Forehand on pt's care. Janeann Forehand requested to speak with his brother, so I transferred the call to pt's room and assisted him with speaking on the phone.

## 2019-05-29 NOTE — ED Triage Notes (Signed)
[facility] transferred pt from [place] to [facility] ED and reports the following:  Staff were in room w pt and witnessed him roll out of bed on the floor and hit his head. They reported no loss of consciousness. No blood thinners on his paperwork. Hx of stroke that affected his right side. Palpated his spine, at thoraic area the pt indicated pain, so they placed a c-collar on him. Pain 8/10.

## 2019-05-29 NOTE — Progress Notes (Signed)
Mr. Bordelon Head CT was reviewed. Blood in this location after trauma would be highly unusual. He has returned to his baseline level neurologically. He does not need a repeat head CT unless his neurological exam were to deteriorate. OK for discharge from my standpoint. Does not need follow up, again if there is not neurological decline.
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
## 2019-05-29 NOTE — ED Notes (Signed)
Hematoma on right side of pt forehead.

## 2019-05-29 NOTE — ED Notes (Signed)
[facility] call for patient transport to [facility].

## 2024-04-01 ENCOUNTER — Encounter (HOSPITAL_COMMUNITY): Payer: Self-pay

## 2024-04-01 ENCOUNTER — Emergency Department (HOSPITAL_COMMUNITY)

## 2024-04-01 ENCOUNTER — Inpatient Hospital Stay (HOSPITAL_COMMUNITY)
Admission: EM | Admit: 2024-04-01 | Discharge: 2024-04-04 | DRG: 057 | Disposition: A | Discharge status: Skilled Nursing Facility | Source: Skilled Nursing Facility | Attending: Internal Medicine | Admitting: Internal Medicine

## 2024-04-01 ENCOUNTER — Other Ambulatory Visit: Payer: Self-pay

## 2024-04-01 DIAGNOSIS — R0902 Hypoxemia: Secondary | ICD-10-CM | POA: Diagnosis present

## 2024-04-01 DIAGNOSIS — I69251 Hemiplegia and hemiparesis following other nontraumatic intracranial hemorrhage affecting right dominant side: Secondary | ICD-10-CM

## 2024-04-01 DIAGNOSIS — I69228 Other speech and language deficits following other nontraumatic intracranial hemorrhage: Secondary | ICD-10-CM

## 2024-04-01 DIAGNOSIS — J45909 Unspecified asthma, uncomplicated: Secondary | ICD-10-CM | POA: Diagnosis present

## 2024-04-01 DIAGNOSIS — E1165 Type 2 diabetes mellitus with hyperglycemia: Secondary | ICD-10-CM | POA: Diagnosis present

## 2024-04-01 DIAGNOSIS — E876 Hypokalemia: Secondary | ICD-10-CM | POA: Diagnosis not present

## 2024-04-01 DIAGNOSIS — Z7982 Long term (current) use of aspirin: Secondary | ICD-10-CM | POA: Diagnosis not present

## 2024-04-01 DIAGNOSIS — F802 Mixed receptive-expressive language disorder: Secondary | ICD-10-CM | POA: Diagnosis present

## 2024-04-01 DIAGNOSIS — R9431 Abnormal electrocardiogram [ECG] [EKG]: Secondary | ICD-10-CM | POA: Diagnosis present

## 2024-04-01 DIAGNOSIS — G40909 Epilepsy, unspecified, not intractable, without status epilepticus: Secondary | ICD-10-CM | POA: Diagnosis not present

## 2024-04-01 DIAGNOSIS — E119 Type 2 diabetes mellitus without complications: Secondary | ICD-10-CM

## 2024-04-01 DIAGNOSIS — R569 Unspecified convulsions: Principal | ICD-10-CM

## 2024-04-01 DIAGNOSIS — G9341 Metabolic encephalopathy: Secondary | ICD-10-CM | POA: Diagnosis not present

## 2024-04-01 DIAGNOSIS — F01518 Vascular dementia, unspecified severity, with other behavioral disturbance: Secondary | ICD-10-CM | POA: Diagnosis present

## 2024-04-01 DIAGNOSIS — F39 Unspecified mood [affective] disorder: Secondary | ICD-10-CM | POA: Diagnosis present

## 2024-04-01 DIAGNOSIS — I639 Cerebral infarction, unspecified: Secondary | ICD-10-CM | POA: Diagnosis not present

## 2024-04-01 DIAGNOSIS — I69220 Aphasia following other nontraumatic intracranial hemorrhage: Secondary | ICD-10-CM | POA: Diagnosis not present

## 2024-04-01 DIAGNOSIS — E872 Acidosis, unspecified: Secondary | ICD-10-CM | POA: Diagnosis present

## 2024-04-01 DIAGNOSIS — I69298 Other sequelae of other nontraumatic intracranial hemorrhage: Secondary | ICD-10-CM | POA: Diagnosis not present

## 2024-04-01 DIAGNOSIS — I10 Essential (primary) hypertension: Secondary | ICD-10-CM | POA: Diagnosis present

## 2024-04-01 DIAGNOSIS — Z1152 Encounter for screening for COVID-19: Secondary | ICD-10-CM | POA: Diagnosis not present

## 2024-04-01 DIAGNOSIS — G40901 Epilepsy, unspecified, not intractable, with status epilepticus: Secondary | ICD-10-CM | POA: Diagnosis present

## 2024-04-01 DIAGNOSIS — F05 Delirium due to known physiological condition: Secondary | ICD-10-CM | POA: Diagnosis present

## 2024-04-01 DIAGNOSIS — Z87442 Personal history of urinary calculi: Secondary | ICD-10-CM

## 2024-04-01 DIAGNOSIS — Z800 Family history of malignant neoplasm of digestive organs: Secondary | ICD-10-CM

## 2024-04-01 DIAGNOSIS — E785 Hyperlipidemia, unspecified: Secondary | ICD-10-CM | POA: Diagnosis present

## 2024-04-01 DIAGNOSIS — R400 Somnolence: Secondary | ICD-10-CM

## 2024-04-01 DIAGNOSIS — E8721 Acute metabolic acidosis: Secondary | ICD-10-CM | POA: Diagnosis not present

## 2024-04-01 DIAGNOSIS — E7220 Disorder of urea cycle metabolism, unspecified: Secondary | ICD-10-CM | POA: Diagnosis not present

## 2024-04-01 DIAGNOSIS — Z79899 Other long term (current) drug therapy: Secondary | ICD-10-CM | POA: Diagnosis not present

## 2024-04-01 DIAGNOSIS — Z8249 Family history of ischemic heart disease and other diseases of the circulatory system: Secondary | ICD-10-CM

## 2024-04-01 DIAGNOSIS — Z833 Family history of diabetes mellitus: Secondary | ICD-10-CM

## 2024-04-01 DIAGNOSIS — Z87891 Personal history of nicotine dependence: Secondary | ICD-10-CM | POA: Diagnosis not present

## 2024-04-01 HISTORY — DX: Malignant (primary) neoplasm, unspecified: C80.1

## 2024-04-01 LAB — BLOOD GAS, ARTERIAL
Acid-base deficit: 6.7 mmol/L — ABNORMAL HIGH (ref 0.0–2.0)
Bicarbonate: 19.2 mmol/L — ABNORMAL LOW (ref 20.0–28.0)
Drawn by: 422461
FIO2: 4 %
O2 Saturation: 95.6 %
Patient temperature: 37.8
pCO2 arterial: 40 mmHg (ref 32–48)
pH, Arterial: 7.29 — ABNORMAL LOW (ref 7.35–7.45)
pO2, Arterial: 76 mmHg — ABNORMAL LOW (ref 83–108)

## 2024-04-01 LAB — URINE DRUG SCREEN
Amphetamines: NEGATIVE
Barbiturates: NEGATIVE
Benzodiazepines: POSITIVE — AB
Cocaine: NEGATIVE
Fentanyl: NEGATIVE
Methadone Scn, Ur: NEGATIVE
Opiates: NEGATIVE
Tetrahydrocannabinol: NEGATIVE

## 2024-04-01 LAB — RESP PANEL BY RT-PCR (RSV, FLU A&B, COVID)  RVPGX2
Influenza A by PCR: NEGATIVE
Influenza B by PCR: NEGATIVE
Resp Syncytial Virus by PCR: NEGATIVE
SARS Coronavirus 2 by RT PCR: NEGATIVE

## 2024-04-01 LAB — COMPREHENSIVE METABOLIC PANEL WITH GFR
ALT: 16 U/L (ref 0–44)
AST: 31 U/L (ref 15–41)
Albumin: 4.2 g/dL (ref 3.5–5.0)
Alkaline Phosphatase: 140 U/L — ABNORMAL HIGH (ref 38–126)
Anion gap: 30 — ABNORMAL HIGH (ref 5–15)
BUN: 12 mg/dL (ref 8–23)
CO2: 11 mmol/L — ABNORMAL LOW (ref 22–32)
Calcium: 9.2 mg/dL (ref 8.9–10.3)
Chloride: 98 mmol/L (ref 98–111)
Creatinine, Ser: 1.05 mg/dL (ref 0.61–1.24)
GFR, Estimated: >60 mL/min (ref >60)
Glucose, Bld: 291 mg/dL — ABNORMAL HIGH (ref 70–99)
Potassium: 3.6 mmol/L (ref 3.5–5.1)
Sodium: 140 mmol/L (ref 135–145)
Total Bilirubin: 0.3 mg/dL (ref 0.0–1.2)
Total Protein: 8.8 g/dL — ABNORMAL HIGH (ref 6.5–8.1)

## 2024-04-01 LAB — CBC WITH DIFFERENTIAL/PLATELET
Abs Immature Granulocytes: 0.07 K/uL (ref 0.00–0.07)
Basophils Absolute: 0.1 K/uL (ref 0.0–0.1)
Basophils Relative: 1 %
Eosinophils Absolute: 0.1 K/uL (ref 0.0–0.5)
Eosinophils Relative: 1 %
HCT: 48.8 % (ref 39.0–52.0)
Hemoglobin: 14.3 g/dL (ref 13.0–17.0)
Immature Granulocytes: 1 %
Lymphocytes Relative: 28 %
Lymphs Abs: 2.9 K/uL (ref 0.7–4.0)
MCH: 29.2 pg (ref 26.0–34.0)
MCHC: 29.3 g/dL — ABNORMAL LOW (ref 30.0–36.0)
MCV: 99.6 fL (ref 80.0–100.0)
Monocytes Absolute: 0.5 K/uL (ref 0.1–1.0)
Monocytes Relative: 5 %
Neutro Abs: 6.8 K/uL (ref 1.7–7.7)
Neutrophils Relative %: 64 %
Platelets: 256 K/uL (ref 150–400)
RBC: 4.9 MIL/uL (ref 4.22–5.81)
RDW: 14.3 % (ref 11.5–15.5)
WBC: 10.5 K/uL (ref 4.0–10.5)
nRBC: 0 % (ref 0.0–0.2)

## 2024-04-01 LAB — I-STAT CHEM 8, ED
BUN: 12 mg/dL (ref 8–23)
Calcium, Ion: 1.07 mmol/L — ABNORMAL LOW (ref 1.15–1.40)
Chloride: 103 mmol/L (ref 98–111)
Creatinine, Ser: 0.9 mg/dL (ref 0.61–1.24)
Glucose, Bld: 285 mg/dL — ABNORMAL HIGH (ref 70–99)
HCT: 48 % (ref 39.0–52.0)
Hemoglobin: 16.3 g/dL (ref 13.0–17.0)
Potassium: 3.5 mmol/L (ref 3.5–5.1)
Sodium: 140 mmol/L (ref 135–145)
TCO2: 15 mmol/L — ABNORMAL LOW (ref 22–32)

## 2024-04-01 LAB — ETHANOL: Alcohol, Ethyl (B): <15 mg/dL (ref <15)

## 2024-04-01 LAB — I-STAT CG4 LACTIC ACID, ED
Lactic Acid, Venous: 4.6 mmol/L (ref 0.5–1.9)
Lactic Acid, Venous: 7.3 mmol/L (ref 0.5–1.9)

## 2024-04-01 LAB — CBG MONITORING, ED: Glucose-Capillary: 257 mg/dL — ABNORMAL HIGH (ref 70–99)

## 2024-04-01 LAB — BETA-HYDROXYBUTYRIC ACID: Beta-Hydroxybutyric Acid: 0.29 mmol/L — ABNORMAL HIGH (ref 0.05–0.27)

## 2024-04-01 LAB — TSH: TSH: 1.82 u[IU]/mL (ref 0.350–4.500)

## 2024-04-01 LAB — AMMONIA: Ammonia: 49 umol/L — ABNORMAL HIGH (ref 9–35)

## 2024-04-01 MED ORDER — PROCHLORPERAZINE EDISYLATE 10 MG/2ML IJ SOLN: 5.0000 mg | Freq: Four times a day (QID) | INTRAMUSCULAR | Status: DC | PRN

## 2024-04-01 MED ORDER — INSULIN ASPART 100 UNIT/ML IJ SOLN
0.0000 [IU] | INTRAMUSCULAR | Status: DC
  Administered 2024-04-02: 3 [IU] via SUBCUTANEOUS
  Administered 2024-04-02: 5 [IU] via SUBCUTANEOUS
  Administered 2024-04-02 – 2024-04-03 (×3): 2 [IU] via SUBCUTANEOUS
  Administered 2024-04-03: 3 [IU] via SUBCUTANEOUS

## 2024-04-01 MED ORDER — ACETAMINOPHEN 500 MG PO TABS
500.0000 mg | ORAL_TABLET | Freq: Four times a day (QID) | ORAL | Status: DC | PRN
  Administered 2024-04-03: 500 mg via ORAL
  Filled 2024-04-01: qty 1

## 2024-04-01 MED ORDER — SODIUM CHLORIDE 0.9 % IV BOLUS
1000.0000 mL | Freq: Once | INTRAVENOUS | Status: AC
  Administered 2024-04-01: 1000 mL via INTRAVENOUS

## 2024-04-01 MED ORDER — LORAZEPAM 2 MG/ML IJ SOLN: 2.0000 mg | Freq: Four times a day (QID) | INTRAMUSCULAR | Status: DC | PRN

## 2024-04-01 MED ORDER — LACTATED RINGERS IV SOLN: INTRAVENOUS | Status: AC

## 2024-04-01 MED ORDER — LEVETIRACETAM (KEPPRA) 500 MG/5 ML ADULT IV PUSH
2000.0000 mg | Freq: Once | INTRAVENOUS | Status: AC
  Administered 2024-04-01: 2000 mg via INTRAVENOUS
  Filled 2024-04-01: qty 20

## 2024-04-01 MED ORDER — LEVETIRACETAM (KEPPRA) 500 MG/5 ML ADULT IV PUSH
1000.0000 mg | Freq: Two times a day (BID) | INTRAVENOUS | Status: DC
  Administered 2024-04-02 – 2024-04-04 (×5): 1000 mg via INTRAVENOUS
  Filled 2024-04-01 (×6): qty 10

## 2024-04-01 MED ORDER — POLYETHYLENE GLYCOL 3350 17 G PO PACK: 17.0000 g | PACK | Freq: Every day | ORAL | Status: DC | PRN

## 2024-04-01 MED ORDER — LORAZEPAM 2 MG/ML IJ SOLN
INTRAMUSCULAR | Status: AC
  Filled 2024-04-01: qty 1

## 2024-04-01 MED ORDER — ENOXAPARIN SODIUM 40 MG/0.4ML IJ SOSY
40.0000 mg | PREFILLED_SYRINGE | Freq: Every day | INTRAMUSCULAR | Status: DC
  Administered 2024-04-02 – 2024-04-04 (×3): 40 mg via SUBCUTANEOUS
  Filled 2024-04-01 (×3): qty 0.4

## 2024-04-01 MED ORDER — LORAZEPAM 2 MG/ML IJ SOLN
2.0000 mg | Freq: Once | INTRAMUSCULAR | Status: AC
  Administered 2024-04-01: 2 mg via INTRAVENOUS

## 2024-04-01 NOTE — ED Notes (Signed)
 Patient arrives via [facility] from [facility] for multiple seizures. Patient arrives responsive to verbal stimuli but unable to follow basic commands.
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
## 2024-04-01 NOTE — ED Triage Notes (Addendum)
 Pt arrives EMS from [facility] with reports of seizures. Per EMS pt had three seizures at facility and was given 5mg  IM versed. EMS reports two additional seizures and gave 2.5mg  IV versed. Pt has hx of same. Pt arrives unresponsive to pain. Pt with even respirations. Per EMS pt bit tongue. Pt had tonic clonic seizures with EMS CBG 220

## 2024-04-01 NOTE — ED Provider Notes (Signed)
  11:04 PM Arrives from [facility] for admission.  He is still a bit drowsy but arouses to voice, protecting airway.  Anticipated ICU admission from prior team and neuro, PCCM has been notified of his arrival.  They will evaluate.  11:15 PM ICU has evaluated, feels he can go to medical service.  Spoke with Dr. Shona, will admit for ongoing care.   Jarold Olam HERO, PA-C 04/01/24 2354    Patt Alm Macho, MD 04/02/24 [phone]
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
## 2024-04-01 NOTE — ED Provider Notes (Signed)
 [facility] EMERGENCY DEPARTMENT AT [facility] Provider Note   CSN: [id number] Arrival date & time: 04/01/24  1935     Patient presents with: Seizures   Jamie Stewart is a 63 y.o. male.   The history is provided by the EMS personnel and medical records. The history is limited by the condition of the patient.  Seizures Seizure activity on arrival: no   Seizure type:  Tonic and myoclonic Initial focality:  None Episode characteristics: partial responsiveness and tongue biting   Postictal symptoms: somnolence   Return to baseline: no   Severity:  Severe Timing:  Clustered Number of seizures this episode:  5 Progression:  Unchanged Recent head injury:  Unable to specify PTA treatment:  Midazolam History of seizures: yes        Prior to Admission medications   Medication Sig Start Date End Date Taking? Authorizing Provider  acetaminophen  (TYLENOL ) 500 MG tablet Take 1,000 mg by mouth every 6 (six) hours as needed for mild pain.    [provider]  albuterol  (VENTOLIN  HFA) 108 (90 Base) MCG/ACT inhaler Inhale 2 puffs into the lungs every 6 (six) hours as needed for wheezing or shortness of breath. Patient not taking: Reported on 07/09/2018 06/05/17   Norleen Lynwood ORN, MD  amLODipine  (NORVASC ) 5 MG tablet Take 1 tablet (5 mg total) by mouth daily. 06/05/17   Norleen Lynwood ORN, MD  aspirin  81 MG chewable tablet Chew 81 mg by mouth daily.    [provider]  carvedilol  (COREG ) 25 MG tablet Take 1 tablet (25 mg total) by mouth 2 (two) times daily with a meal. 06/05/17   Norleen Lynwood ORN, MD  escitalopram  (LEXAPRO ) 20 MG tablet Take 1 tablet (20 mg total) by mouth daily. Patient not taking: Reported on 07/09/2018 08/30/17   Norleen Lynwood ORN, MD  irbesartan  (AVAPRO ) 300 MG tablet Take 1 tablet (300 mg total) by mouth daily. 06/05/17   Norleen Lynwood ORN, MD  irbesartan  (AVAPRO ) 300 MG tablet Take 300 mg by mouth daily.    [provider]  isosorbide  mononitrate (IMDUR ) 30  MG 24 hr tablet TAKE 1 TABLET BY MOUTH EVERY DAY 06/30/18   Norleen Lynwood ORN, MD  levETIRAcetam  (KEPPRA ) 500 MG tablet Take 1 tablet (500 mg total) by mouth 2 (two) times daily. 06/05/17   Norleen Lynwood ORN, MD  levETIRAcetam  (KEPPRA ) 500 MG tablet Take 500 mg by mouth 2 (two) times daily.    [provider]  Multiple Vitamins-Minerals (MULTIVITAMIN PO) Take 1 tablet by mouth daily.    [provider]  Multiple Vitamins-Minerals (MULTIVITAMIN WITH MINERALS) tablet Take 1 tablet by mouth daily.    [provider]  ondansetron  (ZOFRAN ) 4 MG tablet Take 1 tablet (4 mg total) by mouth every 8 (eight) hours as needed for nausea or vomiting. Patient not taking: Reported on 07/09/2018 08/30/17   Norleen Lynwood ORN, MD  pantoprazole  (PROTONIX ) 40 MG tablet Take 1 tablet (40 mg total) by mouth daily. 08/30/17   Norleen Lynwood ORN, MD  simvastatin  (ZOCOR ) 20 MG tablet TAKE 1 TABLET (20 MG TOTAL) BY MOUTH DAILY AT 6 PM. Patient taking differently: Take 20 mg by mouth daily.  06/05/17   Norleen Lynwood ORN, MD  simvastatin  (ZOCOR ) 20 MG tablet Take 20 mg by mouth daily.    [provider]  SUMAtriptan  (IMITREX ) 100 MG tablet Take 1 tablet (100 mg total) by mouth every 2 (two) hours as needed for migraine or headache. May repeat  in 2 hours if headache persists or recurs. 06/05/17   Norleen Lynwood ORN, MD  traZODone  (DESYREL ) 50 MG tablet Take 0.5-1 tablets (25-50 mg total) by mouth at bedtime as needed for sleep. Patient not taking: Reported on 07/09/2018 08/30/17   Norleen Lynwood ORN, MD  vitamin B-12 (CYANOCOBALAMIN ) 1000 MCG tablet Take 1,000 mcg by mouth daily.    [provider]  vitamin B-12 (CYANOCOBALAMIN ) 500 MCG tablet Take 500 mcg by mouth daily.    [provider]    Allergies: Patient has no known allergies.    Review of Systems  Unable to perform ROS: Patient unresponsive  Neurological:  Positive for seizures.    Updated Vital Signs BP (!) 135/100   Pulse (!) 156   Temp 100 F  (37.8 C) (Rectal)   Resp (!) 32   SpO2 90%   Physical Exam Vitals and nursing note reviewed.  Constitutional:      General: Jamie Stewart is not in acute distress.    Appearance: Jamie Stewart is well-developed. Jamie Stewart is not ill-appearing, toxic-appearing or diaphoretic.  HENT:     Head:     Comments: Abrasion to take of tongue from biting.  No deep laceration seen initially    Nose: No congestion.     Mouth/Throat:     Mouth: Mucous membranes are moist.     Comments: Abrasion on tongue  Eyes:     Conjunctiva/sclera: Conjunctivae normal.     Pupils: Pupils are equal, round, and reactive to light.  Cardiovascular:     Rate and Rhythm: Regular rhythm. Tachycardia present.     Pulses: Normal pulses.     Heart sounds: No murmur heard. Pulmonary:     Effort: Pulmonary effort is normal. No respiratory distress.     Breath sounds: Normal breath sounds. No wheezing, rhonchi or rales.  Chest:     Chest wall: No tenderness.  Abdominal:     Palpations: Abdomen is soft.     Tenderness: There is no abdominal tenderness.  Musculoskeletal:        General: No swelling or tenderness.     Cervical back: Neck supple.  Skin:    General: Skin is warm and dry.     Capillary Refill: Capillary refill takes less than 2 seconds.     Findings: No erythema.  Neurological:     Mental Status: Jamie Stewart is alert.     (all labs ordered are listed, but only abnormal results are displayed) Labs Reviewed  CBC WITH DIFFERENTIAL/PLATELET - Abnormal; Notable for the following components:      Result Value   MCHC 29.3 (*)    All other components within normal limits  CBG MONITORING, ED - Abnormal; Notable for the following components:   Glucose-Capillary 257 (*)    All other components within normal limits  I-STAT CHEM 8, ED - Abnormal; Notable for the following components:   Glucose, Bld 285 (*)    Calcium , Ion 1.07 (*)    TCO2 15 (*)    All other components within normal limits  RESP PANEL BY RT-PCR (RSV, FLU A&B, COVID)   RVPGX2  COMPREHENSIVE METABOLIC PANEL WITH GFR  URINALYSIS, W/ REFLEX TO CULTURE (INFECTION SUSPECTED)  URINE DRUG SCREEN  ETHANOL  TSH  AMMONIA  I-STAT CG4 LACTIC ACID, ED    EKG: EKG Interpretation Date/Time:  Wednesday April 01 2024 19:48:52 EDT Ventricular Rate:  154 PR Interval:  112 QRS Duration:  99 QT Interval:  363 QTC Calculation: 582 R Axis:   -  25  Text Interpretation: Sinus tachycardia Multiple premature complexes, vent & supraven Borderline left axis deviation Abnormal R-wave progression, early transition ST depression, probably rate related Prolonged QT interval when compared to prior, faster rate and longer QTc No STEMI Confirmed by Ginger Barefoot [phone]) on 04/01/2024 7:53:14 PM  Radiology: DG Chest Portable 1 View Result Date: 04/01/2024 CLINICAL DATA:  Hypoxia and seizure. EXAM: PORTABLE CHEST 1 VIEW COMPARISON:  Chest radiograph dated 07/09/2018. FINDINGS: Shallow inspiration. Minimal left lung base atelectasis. Pneumonia is not excluded. No pleural effusion pneumothorax. Stable cardiac silhouette. No acute osseous pathology. IMPRESSION: Shallow inspiration with minimal bibasilar atelectasis. No focal consolidation. Electronically Signed   By: Vanetta Chou M.D.   On: 04/01/2024 20:28   CT Head Wo Contrast Result Date: 04/01/2024 EXAM: CT HEAD WITHOUT CONTRAST 04/01/2024 08:10:53 PM TECHNIQUE: CT of the head was performed without the administration of intravenous contrast. Automated exposure control, iterative reconstruction, and/or weight based adjustment of the mA/kV was utilized to reduce the radiation dose to as low as reasonably achievable. COMPARISON: CT head 05/07/2017, CT head 05/28/2021 CLINICAL HISTORY: Mental status change, unknown cause; 5 seizures. Table formatting from the original note was not included.; Notes from triage:; Pt [facility] from [facility] with reports of seizures. Per EMS pt had three seizures at facility and was given 5mg  IM  versed. EMS reports two additional seizures and gave 2.5mg  IV versed. Pt has hx of same. Pt arrives unresponsive to pain. Pt with ; even respirations. Per EMS pt bit tongue. Pt had tonic clonic seizures with EMS CBG 220. FINDINGS: BRAIN AND VENTRICLES: No acute hemorrhage. No evidence of acute infarct. Similar-appearing left frontoparietal and temporal encephalomalacia with gliosis and associated similar appearing chronic blood products or calcification. Patchy and confluent areas of decreased attenuation are noted throughout the deep and periventricular white matter of the cerebral hemispheres bilaterally, suggestive of chronic microvascular ischemic changes. Stable prominence of the lateral ventricles may be related to central predominant atrophy, although a component of normal pressure hydrocephalus/communicating hydrocephalus cannot be excluded. No extra-axial collection. No mass effect or midline shift. ORBITS: No acute abnormality. SINUSES: Left sphenoid sinus, right ethmoid sinus, and right maxillary sinus mucosal thickening. Paranasal sinuses are otherwise clear. Mastoid air cells are clear. SOFT TISSUES AND SKULL: No acute soft tissue abnormality. No skull fracture. IMPRESSION: 1. No acute intracranial abnormality. 2. Other, non-acute and/or normal findings as above. Electronically signed by: Morgane Naveau MD 04/01/2024 08:16 PM EDT RP Workstation: [id number]     Procedures   CRITICAL CARE Performed by: Lonni PARAS Jeorgia Helming Total critical care time: 45 minutes Critical care time was exclusive of separately billable procedures and treating other patients. Critical care was necessary to treat or prevent imminent or life-threatening deterioration. Critical care was time spent personally by me on the following activities: development of treatment plan with patient and/or surrogate as well as nursing, discussions with consultants, evaluation of patient's response to treatment, examination of  patient, obtaining history from patient or surrogate, ordering and performing treatments and interventions, ordering and review of laboratory studies, ordering and review of radiographic studies, pulse oximetry and re-evaluation of patient's condition.  Medications Ordered in the ED  LORazepam  (ATIVAN ) injection 2 mg ( Intravenous Canceled Entry 04/01/24 2026)  levETIRAcetam  (KEPPRA ) undiluted injection 2,000 mg (2,000 mg Intravenous Given 04/01/24 2023)  Medical Decision Making Amount and/or Complexity of Data Reviewed Labs: ordered. Radiology: ordered.  Risk Prescription drug management.    TAVIS KRING is a 63 y.o. male with a past medical history significant for hypertension, hyperlipidemia, anxiety, depression, previous stroke with residual right-sided deficits, seizures, and documented alcohol abuse who presents for multiple seizures today.  According to EMS report, the facility, [facility], says that the patient had 3 seizures today.  Jamie Stewart received 5 mg of IM Versed at the facility and then EMS saw 2 seizures that were tonic-clonic during transport and Jamie Stewart received other 2.5 mg of IV Versed.  Patient has history of seizures and is unclear when his last seizure was or other surrounding symptoms.  There was no report that Jamie Stewart has had other problems before the seizures.  No report of recent fevers, chills, congestion, cough, vomiting, bowel changes or urinary changes.  No report of trauma.  On my arrival, patient is altered and somnolent.  Jamie Stewart has sonorous breathing and does appear to have an abrasion on his tongue.  No deep laceration seen initially.  Oxygen saturations are in the mid 90s on 3 L which Jamie Stewart will remain on.  Lungs did have some coarse breath sounds.  Chest abdomen were nontender.  Jamie Stewart initially was not responding to painful stimuli.  I was worried Jamie Stewart may be still having some subtle seizures as Jamie Stewart did not respond to my loud yelling or painful  stimuli.  After Ativan , patient began responding to painful stimuli on his left side and is purposely moving his left arm.  Jamie Stewart still not answering questions but mental status is improving.  I spoke to Dr. Michaela with neurology who agreed not to do a code stroke as the patient does not have a known last normal but agreed with getting a quick head CT and then ED to ED transfer so they can get him on an EEG machine to look for status.  I spoke to Dr. Ruthe after his head CT returned without evidence of acute hemorrhage and neurology wants him transferred.  Dr. Ruthe accepted for ED to ED transfer to get the rest of his workup completed, neurology evaluation, and ultimately admission.  Given his mental status being so poor, if Jamie Stewart does not continue to improve or begins to worsen to where Jamie Stewart is not protecting his airway, Jamie Stewart may need intubation.  As his mental status is improving during my evaluation and his oxygen saturations are still in the 90s and Jamie Stewart is now is swallowing more purposefully, we will hold on intubation at this time.  Jamie Stewart will go ED to ED to continue workup and need admission, likely to ICU.  8:43 PM Continues to return.  His vital signs now show Jamie Stewart is still tachycardic tachypneic with hypoxia and his temperature is now just under febrile at 100.0.  Jamie Stewart does not have a leukocytosis and does not have anemia.  His metabolic panel just returned showing an anion gap of 30 and a CO2 of 11.  Unclear if this is from lactic acidosis from his seizure or early DKA.  Will order a beta hydroxy uric acid, will get an In-N-Out cath for urine to look for ketones, and will get an ABG.  Will still continues plan for ED to ED transfer but Jamie Stewart will need admission.  9:40 PM Patient still awaiting transport to go to [facility] but is now moving and opening his eyes.  Jamie Stewart is still not following commands but is much  more with it.  His workup continues to return.  His lactic acid is 7.3, again unclear if  this is from infection or from his 5 seizures Jamie Stewart had today.  His pH is acidotic at 7.29 also unclear if this is from his acidosis as his CO2 is normal.  His EtOH is undetectable. Will go ahead and consult critical care as I think Jamie Stewart will need to go to them.  So they are aware when Jamie Stewart gets to [facility].  Also discussed if they feel Jamie Stewart needs antibiotics or not.  9:51 PM Spoke to Dr. Maree with critical care at [facility].  Jamie Stewart agrees that this seems like a metabolic acidosis from the seizures causing a lactic acidosis and they agreed to hold on antibiotics at this time.  Anticipate assessment by the ED team at [facility] to determine level of care needs.  Jamie Stewart did not recommend any other changes other than get him to [facility] to see neurology and be assessed for admission.      Final diagnoses:  Seizure (HCC)  Hypoxia  Somnolence    Clinical Impression: 1. Seizure (HCC)   2. Hypoxia   3. Somnolence     Disposition: ED to ED transfer to [facility] to see neurology and continue workup for seizures and altered mental status.  Anticipate admission likely to ICU given patient's mental status.  This note was prepared with assistance of Conservation officer, historic buildings. Occasional wrong-word or sound-a-like substitutions may have occurred due to the inherent limitations of voice recognition software.      Emerlyn Mehlhoff, Lonni PARAS, MD 04/01/24 2214

## 2024-04-01 NOTE — ED Notes (Signed)
Kirkpatrick, MD at bedside.  

## 2024-04-01 NOTE — ED Notes (Signed)
 Pt. I-stat CG4 Lactic Acid results 7.3, MD, Tegeler made aware.

## 2024-04-02 ENCOUNTER — Encounter (HOSPITAL_COMMUNITY): Payer: Self-pay | Admitting: Internal Medicine

## 2024-04-02 ENCOUNTER — Other Ambulatory Visit: Payer: Self-pay

## 2024-04-02 ENCOUNTER — Inpatient Hospital Stay (HOSPITAL_COMMUNITY)

## 2024-04-02 DIAGNOSIS — G9341 Metabolic encephalopathy: Secondary | ICD-10-CM

## 2024-04-02 DIAGNOSIS — R569 Unspecified convulsions: Secondary | ICD-10-CM | POA: Diagnosis not present

## 2024-04-02 DIAGNOSIS — G40909 Epilepsy, unspecified, not intractable, without status epilepticus: Secondary | ICD-10-CM | POA: Diagnosis not present

## 2024-04-02 DIAGNOSIS — E872 Acidosis, unspecified: Secondary | ICD-10-CM | POA: Insufficient documentation

## 2024-04-02 DIAGNOSIS — E7220 Disorder of urea cycle metabolism, unspecified: Secondary | ICD-10-CM

## 2024-04-02 DIAGNOSIS — E119 Type 2 diabetes mellitus without complications: Secondary | ICD-10-CM

## 2024-04-02 LAB — PHOSPHORUS: Phosphorus: 1.9 mg/dL — ABNORMAL LOW (ref 2.5–4.6)

## 2024-04-02 LAB — CBG MONITORING, ED
Glucose-Capillary: 147 mg/dL — ABNORMAL HIGH (ref 70–99)
Glucose-Capillary: 167 mg/dL — ABNORMAL HIGH (ref 70–99)
Glucose-Capillary: 231 mg/dL — ABNORMAL HIGH (ref 70–99)

## 2024-04-02 LAB — COMPREHENSIVE METABOLIC PANEL WITH GFR
ALT: 18 U/L (ref 0–44)
AST: 24 U/L (ref 15–41)
Albumin: 3.4 g/dL — ABNORMAL LOW (ref 3.5–5.0)
Alkaline Phosphatase: 91 U/L (ref 38–126)
Anion gap: 14 (ref 5–15)
BUN: 5 mg/dL — ABNORMAL LOW (ref 8–23)
CO2: 23 mmol/L (ref 22–32)
Calcium: 8.2 mg/dL — ABNORMAL LOW (ref 8.9–10.3)
Chloride: 100 mmol/L (ref 98–111)
Creatinine, Ser: 0.94 mg/dL (ref 0.61–1.24)
GFR, Estimated: >60 mL/min (ref >60)
Glucose, Bld: 172 mg/dL — ABNORMAL HIGH (ref 70–99)
Potassium: 3.6 mmol/L (ref 3.5–5.1)
Sodium: 137 mmol/L (ref 135–145)
Total Bilirubin: 0.3 mg/dL (ref 0.0–1.2)
Total Protein: 7.8 g/dL (ref 6.5–8.1)

## 2024-04-02 LAB — HEMOGLOBIN A1C
Hgb A1c MFr Bld: 6.7 % — ABNORMAL HIGH (ref 4.8–5.6)
Mean Plasma Glucose: 145.59 mg/dL

## 2024-04-02 LAB — CBC
HCT: 41.3 % (ref 39.0–52.0)
Hemoglobin: 13.6 g/dL (ref 13.0–17.0)
MCH: 30.6 pg (ref 26.0–34.0)
MCHC: 32.9 g/dL (ref 30.0–36.0)
MCV: 92.8 fL (ref 80.0–100.0)
Platelets: 201 K/uL (ref 150–400)
RBC: 4.45 MIL/uL (ref 4.22–5.81)
RDW: 14.5 % (ref 11.5–15.5)
WBC: 13.1 K/uL — ABNORMAL HIGH (ref 4.0–10.5)
nRBC: 0 % (ref 0.0–0.2)

## 2024-04-02 LAB — LACTIC ACID, PLASMA
Lactic Acid, Venous: 4.1 mmol/L (ref 0.5–1.9)
Lactic Acid, Venous: 4.3 mmol/L (ref 0.5–1.9)

## 2024-04-02 LAB — HIV ANTIBODY (ROUTINE TESTING W REFLEX): HIV Screen 4th Generation wRfx: NONREACTIVE

## 2024-04-02 LAB — MAGNESIUM: Magnesium: 1.9 mg/dL (ref 1.7–2.4)

## 2024-04-02 LAB — GLUCOSE, CAPILLARY
Glucose-Capillary: 129 mg/dL — ABNORMAL HIGH (ref 70–99)
Glucose-Capillary: 140 mg/dL — ABNORMAL HIGH (ref 70–99)
Glucose-Capillary: 145 mg/dL — ABNORMAL HIGH (ref 70–99)
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
## 2024-04-02 NOTE — ED Notes (Signed)
 Pt hooked back up to cardiac leads at this time. Pt continues to pull at cords

## 2024-04-02 NOTE — Progress Notes (Signed)
 PROGRESS NOTE    Jamie Stewart  FMW:[id number] DOB: 01/31/1961 DOA: 04/01/2024 PCP: Norleen Lynwood ORN, MD     Brief Narrative:  Jamie Stewart is a 63 y.o. male with medical history significant for seizure disorder, hypertension, hyperlipidemia, chronic anxiety/depression, prediabetes, who initially presented to [facility] ER from SNF due to breakthrough seizures.  Per EMS, the patient had 3 seizures at the facility and was given 5 mg IM Versed.  He had 2 additional seizures witnessed by EMS, tonic-clonic, with evidence of tongue biting.  EMS gave 2.5 mg IV Versed.   Upon arrival at [facility] ER, the patient was somnolent and difficult to arouse.  He received IV Ativan .  Became more responsive.  EDP discussed the case with neurology.  Noncontrast head CT was obtained and was non acute.  Decision was made to transfer ED to ED for continuous EEG.  At [facility] ER, the patient was seen by neurology and critical medicine teams.  He is somnolent however he is arousable to voices.  He is protecting his airway.  He has high anion gap metabolic acidosis secondary to lactic acidosis greater than 7, likely from his seizures.  Admitted by [facility], hospitalist service, for further management of his breakthrough seizures.  New events last 24 hours / Subjective: Patient seen in ED. Remains encephalopathic but able to follow some commands.   Assessment & Plan:   Principal Problem:   Seizure (HCC) Active Problems:   Seizure disorder (HCC)   Controlled type 2 diabetes mellitus without complication, without long-term current use of insulin  (HCC)   Lactic acid acidosis   Seizure disorder with breakthrough seizures - Neurology following, discussed with Dr. Arora today - Keppra  - Ativan  PRN  - Seizure precautions  Lactic acidosis and metabolic acidosis - Likely due to seizure - Lactic improving   Diabetes  - A1c 6.7 - SSI  Holding home meds until SLP eval complete - including lipitor,  baclofen, neurontin, avapro , namenda, effexor     DVT prophylaxis:  enoxaparin  (LOVENOX ) injection 40 mg Start: 04/02/24 1000  Code Status: Full Family Communication: None at bedside Disposition Plan: SNF Status is: Inpatient Remains inpatient appropriate because: Seizure management     Antimicrobials:  Anti-infectives (From admission, onward)    None        Objective: Vitals:   04/02/24 0517 04/02/24 0700 04/02/24 0930 04/02/24 1057  BP: (!) 125/90 131/85 (!) 137/108   Pulse: (!) 105 96 (!) 107   Resp: 19 (!) 21 (!) 26 (!) 23  Temp:      TempSrc:      SpO2: 98% 98% 98%    No intake or output data in the 24 hours ending 04/02/24 1205 There were no vitals filed for this visit.  Examination:  General exam: Appears calm and comfortable  Respiratory system: Respiratory effort normal. No respiratory distress. No conversational dyspnea.  Cardiovascular system: S1 & S2 heard, RRR. Gastrointestinal system: Abdomen is nondistended Central nervous system: Alert, does not verbalize, able to follow command to follow my finger with his eyes, moves all extremities spontaneously  Extremities: Symmetric in appearance  Skin: No rashes, lesions or ulcers on exposed skin   Data Reviewed: I have personally reviewed following labs and imaging studies  CBC: Recent Labs  Lab 04/01/24 1949 04/01/24 1959 04/02/24 0518  WBC 10.5  --  13.1*  NEUTROABS 6.8  --   --   HGB 14.3 16.3 13.6  HCT 48.8 48.0 41.3  MCV  99.6  --  92.8  PLT 256  --  201   Basic Metabolic Panel: Recent Labs  Lab 04/01/24 1949 04/01/24 1959 04/02/24 0518  NA 140 140 137  K 3.6 3.5 3.6  CL 98 103 100  CO2 11*  --  23  GLUCOSE 291* 285* 172*  BUN 12 12 5*  CREATININE 1.05 0.90 0.94  CALCIUM  9.2  --  8.2*  MG  --   --  1.9  PHOS  --   --  1.9*   GFR: CrCl cannot be calculated (Unknown ideal weight.). Liver Function Tests: Recent Labs  Lab 04/01/24 1949 04/02/24 0518  AST 31 24  ALT 16 18   ALKPHOS 140* 91  BILITOT 0.3 0.3  PROT 8.8* 7.8  ALBUMIN 4.2 3.4*   No results for input(s): LIPASE, AMYLASE in the last 168 hours. Recent Labs  Lab 04/01/24 2113  AMMONIA 49*   Coagulation Profile: No results for input(s): INR, PROTIME in the last 168 hours. Cardiac Enzymes: No results for input(s): CKTOTAL, CKMB, CKMBINDEX, TROPONINI in the last 168 hours. BNP (last 3 results) No results for input(s): PROBNP in the last 8760 hours. HbA1C: Recent Labs    04/02/24 0518  HGBA1C 6.7*   CBG: Recent Labs  Lab 04/01/24 1944 04/02/24 0006 04/02/24 0351 04/02/24 0751  GLUCAP 257* 231* 167* 147*   Lipid Profile: No results for input(s): CHOL, HDL, LDLCALC, TRIG, CHOLHDL, LDLDIRECT in the last 72 hours. Thyroid Function Tests: Recent Labs    04/01/24 2113  TSH 1.820   Anemia Panel: No results for input(s): VITAMINB12, FOLATE, FERRITIN, TIBC, IRON, RETICCTPCT in the last 72 hours. Sepsis Labs: Recent Labs  Lab 04/01/24 2132 04/01/24 2335 04/02/24 0554 04/02/24 0917  LATICACIDVEN 7.3* 4.6* 4.1* 4.3*    Recent Results (from the past 240 hours)  Blood culture (routine x 2)     Status: None (Preliminary result)   Collection Time: 04/01/24  9:00 PM   Specimen: BLOOD  Result Value Ref Range Status   Specimen Description   Final    BLOOD SITE NOT SPECIFIED Performed at [facility], [address] [place], [place] [address]    Special Requests   Final    BOTTLES DRAWN AEROBIC AND ANAEROBIC Blood Culture results may not be optimal due to an inadequate volume of blood received in culture bottles Performed at [facility], [address] [place], [place] [address]    Culture   Final    NO GROWTH < 12 HOURS Performed at [facility], [address] [place], [place] [address]    Report Status PENDING  Incomplete  Blood culture (routine x 2)     Status: None (Preliminary result)    Collection Time: 04/01/24  9:08 PM   Specimen: BLOOD  Result Value Ref Range Status   Specimen Description   Final    BLOOD SITE NOT SPECIFIED Performed at [facility], [address], [place], [place] [address]    Special Requests   Final    Blood Culture results may not be optimal due to an inadequate volume of blood received in culture bottles BOTTLES DRAWN AEROBIC AND ANAEROBIC Performed at [facility], [address] [place], [place] [address]    Culture   Final    NO GROWTH < 12 HOURS Performed at [facility], [address] [place], [place] [address]    Report Status PENDING  Incomplete  Resp panel by RT-PCR (RSV, Flu A&B, Covid) Anterior Nasal Swab     Status: None   Collection Time: 04/01/24  9:22 PM   Specimen: Anterior Nasal Swab  Result Value Ref Range Status   SARS Coronavirus 2 by RT PCR NEGATIVE NEGATIVE Final    Comment: (NOTE) SARS-CoV-2 target nucleic acids are NOT DETECTED.  The SARS-CoV-2 RNA is generally detectable in upper respiratory specimens during the acute phase of infection. The lowest concentration of SARS-CoV-2 viral copies this assay can detect is 138 copies/mL. A negative result does not preclude SARS-Cov-2 infection and should not be used as the sole basis for treatment or other patient management decisions. A negative result may occur with  improper specimen collection/handling, submission of specimen other than nasopharyngeal swab, presence of viral mutation(s) within the areas targeted by this assay, and inadequate number of viral copies(<138 copies/mL). A negative result must be combined with clinical observations, patient history, and epidemiological information. The expected result is Negative.  Fact Sheet for Patients:  bloggercourse.com  Fact Sheet for Healthcare Providers:  seriousbroker.it  This test is no t yet approved or cleared  by the [place]  FDA and  has been authorized for detection and/or diagnosis of SARS-CoV-2 by FDA under an Emergency Use Authorization (EUA). This EUA will remain  in effect (meaning this test can be used) for the duration of the COVID-19 declaration under Section 564(b)(1) of the Act, 21 U.S.C.section 360bbb-3(b)(1), unless the authorization is terminated  or revoked sooner.       Influenza A by PCR NEGATIVE NEGATIVE Final   Influenza B by PCR NEGATIVE NEGATIVE Final    Comment: (NOTE) The Xpert Xpress SARS-CoV-2/FLU/RSV plus assay is intended as an aid in the diagnosis of influenza from Nasopharyngeal swab specimens and should not be used as a sole basis for treatment. Nasal washings and aspirates are unacceptable for Xpert Xpress SARS-CoV-2/FLU/RSV testing.  Fact Sheet for Patients: bloggercourse.com  Fact Sheet for Healthcare Providers: seriousbroker.it  This test is not yet approved or cleared by the [place]  FDA and has been authorized for detection and/or diagnosis of SARS-CoV-2 by FDA under an Emergency Use Authorization (EUA). This EUA will remain in effect (meaning this test can be used) for the duration of the COVID-19 declaration under Section 564(b)(1) of the Act, 21 U.S.C. section 360bbb-3(b)(1), unless the authorization is terminated or revoked.     Resp Syncytial Virus by PCR NEGATIVE NEGATIVE Final    Comment: (NOTE) Fact Sheet for Patients: bloggercourse.com  Fact Sheet for Healthcare Providers: seriousbroker.it  This test is not yet approved or cleared by the [place]  FDA and has been authorized for detection and/or diagnosis of SARS-CoV-2 by FDA under an Emergency Use Authorization (EUA). This EUA will remain in effect (meaning this test can be used) for the duration of the COVID-19 declaration under Section 564(b)(1) of the Act, 21  U.S.C. section 360bbb-3(b)(1), unless the authorization is terminated or revoked.  Performed at [facility], [address], [place], [place] [address]       Radiology Studies: DG Chest Portable 1 View Result Date: 04/01/2024 CLINICAL DATA:  Hypoxia and seizure. EXAM: PORTABLE CHEST 1 VIEW COMPARISON:  Chest radiograph dated 07/09/2018. FINDINGS: Shallow inspiration. Minimal left lung base atelectasis. Pneumonia is not excluded. No pleural effusion pneumothorax. Stable cardiac silhouette. No acute osseous pathology. IMPRESSION: Shallow inspiration with minimal bibasilar atelectasis. No focal consolidation. Electronically Signed   By: Vanetta Chou M.D.   On: 04/01/2024 20:28  CT Head Wo Contrast Result Date: 04/01/2024 EXAM: CT HEAD WITHOUT CONTRAST 04/01/2024 08:10:53 PM TECHNIQUE: CT of the head was performed without the administration of intravenous contrast. Automated exposure control, iterative reconstruction, and/or weight based adjustment of the mA/kV was utilized to reduce the radiation dose to as low as reasonably achievable. COMPARISON: CT head 05/07/2017, CT head 05/28/2021 CLINICAL HISTORY: Mental status change, unknown cause; 5 seizures. Table formatting from the original note was not included.; Notes from triage:; Pt arrives EMS from [facility] with reports of seizures. Per EMS pt had three seizures at facility and was given 5mg  IM versed. EMS reports two additional seizures and gave 2.5mg  IV versed. Pt has hx of same. Pt arrives unresponsive to pain. Pt with ; even respirations. Per EMS pt bit tongue. Pt had tonic clonic seizures with EMS CBG 220. FINDINGS: BRAIN AND VENTRICLES: No acute hemorrhage. No evidence of acute infarct. Similar-appearing left frontoparietal and temporal encephalomalacia with gliosis and associated similar appearing chronic blood products or calcification. Patchy and confluent areas of decreased attenuation are noted throughout the  deep and periventricular white matter of the cerebral hemispheres bilaterally, suggestive of chronic microvascular ischemic changes. Stable prominence of the lateral ventricles may be related to central predominant atrophy, although a component of normal pressure hydrocephalus/communicating hydrocephalus cannot be excluded. No extra-axial collection. No mass effect or midline shift. ORBITS: No acute abnormality. SINUSES: Left sphenoid sinus, right ethmoid sinus, and right maxillary sinus mucosal thickening. Paranasal sinuses are otherwise clear. Mastoid air cells are clear. SOFT TISSUES AND SKULL: No acute soft tissue abnormality. No skull fracture. IMPRESSION: 1. No acute intracranial abnormality. 2. Other, non-acute and/or normal findings as above. Electronically signed by: Morgane Naveau MD 04/01/2024 08:16 PM EDT RP Workstation: [id number]      Scheduled Meds:  enoxaparin  (LOVENOX ) injection  40 mg Subcutaneous Daily   insulin  aspart  0-15 Units Subcutaneous Q4H   levETIRAcetam   1,000 mg Intravenous Q12H   Continuous Infusions:  lactated ringers 100 mL/hr at 04/02/24 0018     LOS: 1 day   Time spent: 40 minutes   Delon Hoe, DO [facility] 04/02/2024, 12:05 PM   Available via Epic secure chat 7am-7pm After these hours, please refer to coverage provider listed on [email or web address]

## 2024-04-02 NOTE — Plan of Care (Signed)
  Problem: SLP Dysphagia Goals Goal: Patient will demonstrate readiness for PO's Description: Patient will demonstrate readiness for PO's and/or instrumental swallow study as evidenced by: Flowsheets (Taken 04/02/2024 1401) Patient will demonstrate readiness for PO's and/or instrumental swallow study as evidenced by:  with modified independence  initiation of swallow sequence

## 2024-04-02 NOTE — ED Notes (Signed)
 CCMD called to report patient transfer to [phone].
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
## 2024-04-02 NOTE — Progress Notes (Signed)
 EEG complete - results pending

## 2024-04-02 NOTE — Progress Notes (Signed)
 Tried to call brother, Darci, phone has been disconnected. Not able to do complete admission.

## 2024-04-02 NOTE — Progress Notes (Signed)
 NEUROLOGY CONSULT FOLLOW UP NOTE   Date of service: April 02, 2024 Patient Name: Jamie Stewart MRN:  [id number] DOB:  06/05/1960  Interval Hx/subjective  Patient has remained hemodynamically stable overnight with Tmax of 100.  He has had no further overt seizure activity, and EEG read is pending but preliminary review of the initial part of the EEG shows slowing only.  No evidence of seizures  Vitals   Vitals:   04/02/24 0246 04/02/24 0400 04/02/24 0517 04/02/24 0700  BP:  (!) 148/92 (!) 125/90 131/85  Pulse:  (!) 105 (!) 105 96  Resp:  (!) 22 19 (!) 21  Temp: 98.7 F (37.1 C)     TempSrc: Rectal     SpO2:  97% 98% 98%     There is no height or weight on file to calculate BMI.  Physical Exam   Constitutional: Chronically ill-appearing patient in no acute distress Psych: Affect blunted Eyes: No scleral injection.  HENT: No OP obstrucion.  Head: Normocephalic.  Cardiovascular: Normal rate and regular rhythm.  Respiratory: Effort normal, non-labored breathing.  Skin: WDI.   Neurologic Examination    NEURO:  Mental Status: Patient is nonverbal and unable to follow commands.  He does not mimic.  He does track the examiner and not yes and no at times and consistently Speech/Language: No verbal output  Cranial Nerves:  II: PERRL.  III, IV, VI: Tracks examiner around the room VII: Subtle right facial droop XII: Tongue cooperative with tongue protrusion Motor: Moves left upper and lower extremities with antigravity strength, some tremoring of right lower extremity, which she will move but not lift off the bed and no movement of right upper extremity Tone: is normal and bulk is normal Sensation-appears intact to light touch Coordination: Unable to perform Gait- deferred   Medications  Current Facility-Administered Medications:    acetaminophen  (TYLENOL ) tablet 500 mg, 500 mg, Oral, Q6H PRN, Jamie, Carole N, DO   enoxaparin  (LOVENOX ) injection 40 mg, 40 mg,  Subcutaneous, Daily, Jamie, Carole N, DO   insulin  aspart (novoLOG ) injection 0-15 Units, 0-15 Units, Subcutaneous, Q4H, Jamie, Carole N, DO, 2 Units at 04/02/24 0801   lactated ringers infusion, , Intravenous, Continuous, Jamie Terry SAILOR, DO, Last Rate: 100 mL/hr at 04/02/24 0018, New Bag at 04/02/24 0018   levETIRAcetam  (KEPPRA ) undiluted injection 1,000 mg, 1,000 mg, Intravenous, Q12H, Jamie Aisha SQUIBB, MD, 1,000 mg at 04/02/24 9198   LORazepam  (ATIVAN ) injection 2 mg, 2 mg, Intravenous, Q6H PRN, Jamie, Carole N, DO   polyethylene glycol (MIRALAX / GLYCOLAX) packet 17 g, 17 g, Oral, Daily PRN, Jamie, Carole N, DO   prochlorperazine (COMPAZINE) injection 5 mg, 5 mg, Intravenous, Q6H PRN, Jamie Terry SAILOR, DO  Current Outpatient Medications:    aspirin  EC 325 MG tablet, Take 325 mg by mouth daily., Disp: , Rfl:    atorvastatin  (LIPITOR) 10 MG tablet, Take 10 mg by mouth every evening., Disp: , Rfl:    baclofen (LIORESAL) 10 MG tablet, Take 10 mg by mouth 3 (three) times daily., Disp: , Rfl:    chlorhexidine  (PERIDEX ) 0.12 % solution, Use as directed 15 mLs in the mouth or throat 2 (two) times daily., Disp: , Rfl:    Cholecalciferol (VITAMIN D) 50 MCG (2000 UT) CAPS, Take 2,000 Units by mouth daily., Disp: , Rfl:    gabapentin (NEURONTIN) 300 MG capsule, Take 300 mg by mouth 3 (three) times daily., Disp: , Rfl:    irbesartan  (AVAPRO ) 300 MG tablet, Take 1  tablet (300 mg total) by mouth daily., Disp: 60 tablet, Rfl: 11   latanoprost (XALATAN) 0.005 % ophthalmic solution, Place 1 drop into both eyes at bedtime., Disp: , Rfl:    levETIRAcetam  (KEPPRA ) 500 MG tablet, Take 1 tablet (500 mg total) by mouth 2 (two) times daily., Disp: 60 tablet, Rfl: 11   Melatonin 12 MG TABS, Take 12 mg by mouth at bedtime., Disp: , Rfl:    memantine (NAMENDA) 10 MG tablet, Take 10 mg by mouth daily., Disp: , Rfl:    Multiple Vitamins-Minerals (MULTIVITAMIN WITH MINERALS) tablet, Take 1 tablet by mouth daily., Disp: ,  Rfl:    potassium chloride  20 MEQ/15ML (10%) SOLN, Take 10 mEq by mouth daily in the afternoon., Disp: , Rfl:    venlafaxine XR (EFFEXOR-XR) 150 MG 24 hr capsule, Take 150 mg by mouth daily., Disp: , Rfl:    vitamin B-12 (CYANOCOBALAMIN ) 500 MCG tablet, Take 500 mcg by mouth daily., Disp: , Rfl:   Labs and Diagnostic Imaging   CBC:  Recent Labs  Lab 04/01/24 1949 04/01/24 1959 04/02/24 0518  WBC 10.5  --  13.1*  NEUTROABS 6.8  --   --   HGB 14.3 16.3 13.6  HCT 48.8 48.0 41.3  MCV 99.6  --  92.8  PLT 256  --  201    Basic Metabolic Panel:  Lab Results  Component Value Date   NA 137 04/02/2024   K 3.6 04/02/2024   CO2 23 04/02/2024   GLUCOSE 172 (H) 04/02/2024   BUN 5 (L) 04/02/2024   CREATININE 0.94 04/02/2024   CALCIUM  8.2 (L) 04/02/2024   GFRNONAA >60 04/02/2024   GFRAA >60 08/01/2018   Lipid Panel:  Lab Results  Component Value Date   LDLCALC 118 (H) 07/19/2018   HgbA1c:  Lab Results  Component Value Date   HGBA1C 6.7 (H) 04/02/2024   Urine Drug Screen:     Component Value Date/Time   LABOPIA NEGATIVE 04/01/2024 2142   COCAINSCRNUR NEGATIVE 04/01/2024 2142   LABBENZ POSITIVE (A) 04/01/2024 2142   AMPHETMU NEGATIVE 04/01/2024 2142   THCU NEGATIVE 04/01/2024 2142   LABBARB NEGATIVE 04/01/2024 2142    Alcohol Level     Component Value Date/Time   ETH <15 04/01/2024 1949   INR  Lab Results  Component Value Date   INR 1.03 07/17/2018   APTT  Lab Results  Component Value Date   APTT 24 07/17/2018   Ammonia 49  CT Head without contrast(Personally reviewed): No acute abnormality, left frontoparietal and temporal encephalomalacia  rEEG:  Pending  Assessment   Jamie Stewart is a 63 y.o. male with a history of hypertension, hyperlipidemia, anxiety, depression, seizures and left-sided ICH in 2020 who presents from his facility with breakthrough seizures.  According to EMS, he had 3 seizures at the facility as well as 2 seizures for EMS and was  treated with Versed.  He does have a lactic acidosis, likely from seizures, which is improving.  Keppra  was increased to 1000 mg twice daily.  Resting EEG is pending.  On exam, he is unable to follow commands and has no verbal output.  Unclear what his baseline is, either this is baseline or suspect that a prolonged postictal state and possible metabolic encephalopathy given elevated ammonia is the etiology of his altered mental status.  Recommendations  -Continue Keppra  1000 mg twice daily - Seizure precautions - Follow-up on urinalysis - Workup and treatment for causes of toxic metabolic encephalopathy per primary team -  Neurology will continue to follow ______________________________________________________________________ Patient seen by NP with MD, MD to addend note as needed.  Signed, Cortney E Everitt Clint Kill, NP [facility]   Attending Neurohospitalist Addendum Patient seen and examined with APP/Resident. Agree with the history and physical as documented above. Agree with the plan as documented, which I helped formulate. I have independently reviewed the chart, obtained history, review of systems and examined the patient.I have personally reviewed pertinent head/neck/spine imaging (CT/MRI). Plan relayed to Dr. Rojelio Please feel free to call with any questions.  -- Eligio Lav, MD Neurologist [facility] Pager: [phone]

## 2024-04-02 NOTE — ED Notes (Signed)
 Pt breif and bedding changed, condom cath was off upon arrival to room 10. Blankets/seizure pads placed on rails.

## 2024-04-02 NOTE — Progress Notes (Signed)
 [facility] night cross cover note:  I was notified by RN that this patient is agitated, pulling at their telemetry leads and peripheral IV, successfully removing the three most recent piv's in spite of b/l mitts, with these behaviors refractory to attempts at verbal redirection.  In the setting of associated interference with ongoing medical treatment posing potential harm to themself, I have placed order for soft bilateral wrist restraints .   Eva Pore, DO Hospitalist

## 2024-04-03 DIAGNOSIS — E119 Type 2 diabetes mellitus without complications: Secondary | ICD-10-CM | POA: Diagnosis not present

## 2024-04-03 DIAGNOSIS — R569 Unspecified convulsions: Secondary | ICD-10-CM | POA: Diagnosis not present

## 2024-04-03 DIAGNOSIS — E872 Acidosis, unspecified: Secondary | ICD-10-CM | POA: Diagnosis not present

## 2024-04-03 LAB — CBC
HCT: 41.2 % (ref 39.0–52.0)
Hemoglobin: 13.6 g/dL (ref 13.0–17.0)
MCH: 30.1 pg (ref 26.0–34.0)
MCHC: 33 g/dL (ref 30.0–36.0)
MCV: 91.2 fL (ref 80.0–100.0)
Platelets: 216 K/uL (ref 150–400)
RBC: 4.52 MIL/uL (ref 4.22–5.81)
RDW: 14.5 % (ref 11.5–15.5)
WBC: 10 K/uL (ref 4.0–10.5)
nRBC: 0 % (ref 0.0–0.2)

## 2024-04-03 LAB — BASIC METABOLIC PANEL WITH GFR
Anion gap: 14 (ref 5–15)
BUN: 5 mg/dL — ABNORMAL LOW (ref 8–23)
CO2: 24 mmol/L (ref 22–32)
Calcium: 8.6 mg/dL — ABNORMAL LOW (ref 8.9–10.3)
Chloride: 102 mmol/L (ref 98–111)
Creatinine, Ser: 0.78 mg/dL (ref 0.61–1.24)
GFR, Estimated: >60 mL/min (ref >60)
Glucose, Bld: 133 mg/dL — ABNORMAL HIGH (ref 70–99)
Potassium: 3.4 mmol/L — ABNORMAL LOW (ref 3.5–5.1)
Sodium: 140 mmol/L (ref 135–145)

## 2024-04-03 LAB — GLUCOSE, CAPILLARY
Glucose-Capillary: 102 mg/dL — ABNORMAL HIGH (ref 70–99)
Glucose-Capillary: 109 mg/dL — ABNORMAL HIGH (ref 70–99)
Glucose-Capillary: 114 mg/dL — ABNORMAL HIGH (ref 70–99)
Glucose-Capillary: 122 mg/dL — ABNORMAL HIGH (ref 70–99)
Glucose-Capillary: 124 mg/dL — ABNORMAL HIGH (ref 70–99)
Glucose-Capillary: 125 mg/dL — ABNORMAL HIGH (ref 70–99)

## 2024-04-03 MED ORDER — MEMANTINE HCL 10 MG PO TABS
10.0000 mg | ORAL_TABLET | Freq: Every day | ORAL | Status: DC
  Administered 2024-04-03 – 2024-04-04 (×2): 10 mg via ORAL
  Filled 2024-04-03 (×2): qty 1

## 2024-04-03 MED ORDER — CYANOCOBALAMIN 500 MCG PO TABS
500.0000 ug | ORAL_TABLET | Freq: Every day | ORAL | Status: DC
  Administered 2024-04-03 – 2024-04-04 (×2): 500 ug via ORAL
  Filled 2024-04-03 (×3): qty 1

## 2024-04-03 MED ORDER — VENLAFAXINE HCL ER 75 MG PO CP24
150.0000 mg | ORAL_CAPSULE | Freq: Every day | ORAL | Status: DC
  Administered 2024-04-03 – 2024-04-04 (×2): 150 mg via ORAL
  Filled 2024-04-03: qty 2

## 2024-04-03 MED ORDER — ATORVASTATIN CALCIUM 10 MG PO TABS
10.0000 mg | ORAL_TABLET | Freq: Every evening | ORAL | Status: DC
  Administered 2024-04-03: 10 mg via ORAL
  Filled 2024-04-03: qty 1

## 2024-04-03 MED ORDER — GABAPENTIN 300 MG PO CAPS
300.0000 mg | ORAL_CAPSULE | Freq: Three times a day (TID) | ORAL | Status: DC
  Administered 2024-04-03 – 2024-04-04 (×5): 300 mg via ORAL
  Filled 2024-04-03 (×5): qty 1

## 2024-04-03 MED ORDER — HALOPERIDOL LACTATE 5 MG/ML IJ SOLN: 2.0000 mg | Freq: Four times a day (QID) | INTRAMUSCULAR | Status: DC | PRN

## 2024-04-03 MED ORDER — MELATONIN 5 MG PO TABS
10.0000 mg | ORAL_TABLET | Freq: Every day | ORAL | Status: DC
  Administered 2024-04-03: 10 mg via ORAL
  Filled 2024-04-03: qty 2

## 2024-04-03 MED ORDER — POTASSIUM CHLORIDE 10 MEQ/100ML IV SOLN
10.0000 meq | INTRAVENOUS | Status: AC
  Administered 2024-04-03 (×3): 10 meq via INTRAVENOUS
  Filled 2024-04-03 (×2): qty 100

## 2024-04-03 MED ORDER — BACLOFEN 10 MG PO TABS
10.0000 mg | ORAL_TABLET | Freq: Three times a day (TID) | ORAL | Status: DC
  Administered 2024-04-03 – 2024-04-04 (×5): 10 mg via ORAL
  Filled 2024-04-03 (×5): qty 1

## 2024-04-03 MED ORDER — MAGNESIUM SULFATE 2 GM/50ML IV SOLN
2.0000 g | Freq: Once | INTRAVENOUS | Status: AC
  Administered 2024-04-03: 2 g via INTRAVENOUS
  Filled 2024-04-03: qty 50

## 2024-04-03 NOTE — Plan of Care (Signed)
  Problem: Coping: Goal: Ability to adjust to condition or change in health will improve Outcome: Progressing   Problem: Health Behavior/Discharge Planning: Goal: Ability to identify and utilize available resources and services will improve Outcome: Progressing   Problem: Skin Integrity: Goal: Risk for impaired skin integrity will decrease Outcome: Progressing   Problem: Tissue Perfusion: Goal: Adequacy of tissue perfusion will improve Outcome: Progressing   Problem: Safety: Goal: Ability to remain free from injury will improve Outcome: Progressing

## 2024-04-03 NOTE — Plan of Care (Signed)
  Problem: Education: Goal: Ability to describe self-care measures that may prevent or decrease complications (Diabetes Survival Skills Education) will improve Outcome: Progressing   Problem: Coping: Goal: Ability to adjust to condition or change in health will improve Outcome: Progressing   Problem: Fluid Volume: Goal: Ability to maintain a balanced intake and output will improve Outcome: Progressing   Problem: Metabolic: Goal: Ability to maintain appropriate glucose levels will improve Outcome: Progressing   Problem: Nutritional: Goal: Maintenance of adequate nutrition will improve Outcome: Progressing   Problem: Tissue Perfusion: Goal: Adequacy of tissue perfusion will improve Outcome: Progressing   Problem: Health Behavior/Discharge Planning: Goal: Ability to manage health-related needs will improve Outcome: Progressing

## 2024-04-03 NOTE — Progress Notes (Signed)
 NEUROLOGY CONSULT FOLLOW UP NOTE   Date of service: April 03, 2024 Patient Name: Jamie Stewart MRN:  [id number] DOB:  [birth year]  Interval Hx/subjective  Patient has remained hemodynamically stable overnight; neuro exam is improved.  He is able to follow commands and states his name which is much better than yesterday.  Vitals   Vitals:   04/03/24 0035 04/03/24 0400 04/03/24 0445 04/03/24 0700  BP: (!) 154/98 (!) 140/96 (!) 140/96 (!) 133/94  Pulse: 99  98   Resp: 18     Temp: 98.8 F (37.1 C)  98.8 F (37.1 C) 98.7 F (37.1 C)  TempSrc: Oral  Oral Oral  SpO2:      Weight:      Height:         Body mass index is 29.21 kg/m.  Physical Exam   Constitutional: Chronically ill-appearing patient in no acute distress Psych: Affect blunted Eyes: No scleral injection.  HENT: No OP obstrucion.  Head: Normocephalic.  Cardiovascular: Normal rate and regular rhythm.  Respiratory: Effort normal, non-labored breathing.  Skin: WDI.   Neurologic Examination    NEURO:  Mental Status: Alert, oriented to self.  Worse expressive aphasia than receptive aphasia.  Follows simple commands today which she was not doing before.  Is able to tell me his name and age and a very dysarthric manner.  Unable to repeat long sentences. Cranial Nerves:  II: PERRL.  III, IV, VI: Tracks examiner around the room VII: Subtle right facial droop XII: Tongue cooperative with tongue protrusion Motor: Moves left upper and lower extremities with antigravity strength.  He is weaker on the right lower extremity, but is able to lift it off the bed. Tone: is normal and bulk is normal Sensation-appears intact to light touch Coordination: Unable to perform Gait- deferred   Medications  Current Facility-Administered Medications:    acetaminophen  (TYLENOL ) tablet 500 mg, 500 mg, Oral, Q6H PRN, Shona Terry SAILOR, DO, 500 mg at 04/03/24 9171   atorvastatin  (LIPITOR) tablet 10 mg, 10 mg, Oral, QPM, Ghimire,  Donalda HERO, MD   baclofen (LIORESAL) tablet 10 mg, 10 mg, Oral, TID, Ghimire, Donalda HERO, MD   cyanocobalamin  (VITAMIN B12) tablet 500 mcg, 500 mcg, Oral, Daily, Ghimire, Donalda HERO, MD   enoxaparin  (LOVENOX ) injection 40 mg, 40 mg, Subcutaneous, Daily, Hall, Carole N, DO, 40 mg at 04/03/24 9182   gabapentin (NEURONTIN) capsule 300 mg, 300 mg, Oral, TID, Ghimire, Donalda HERO, MD   insulin  aspart (novoLOG ) injection 0-15 Units, 0-15 Units, Subcutaneous, Q4H, Shona Terry SAILOR, DO, 3 Units at 04/03/24 9183   levETIRAcetam  (KEPPRA ) undiluted injection 1,000 mg, 1,000 mg, Intravenous, Q12H, Michaela Aisha SQUIBB, MD, 1,000 mg at 04/03/24 0818   LORazepam  (ATIVAN ) injection 2 mg, 2 mg, Intravenous, Q6H PRN, Shona, Carole N, DO   magnesium  sulfate IVPB 2 g 50 mL, 2 g, Intravenous, Once, Ghimire, Donalda HERO, MD   melatonin tablet 10 mg, 10 mg, Oral, QHS, Ghimire, Donalda HERO, MD   memantine (NAMENDA) tablet 10 mg, 10 mg, Oral, Daily, Ghimire, Donalda HERO, MD   polyethylene glycol (MIRALAX / GLYCOLAX) packet 17 g, 17 g, Oral, Daily PRN, Hall, Carole N, DO   potassium chloride  10 mEq in 100 mL IVPB, 10 mEq, Intravenous, Q1 Hr x 3, Ghimire, Shanker M, MD   prochlorperazine (COMPAZINE) injection 5 mg, 5 mg, Intravenous, Q6H PRN, Hall, Carole N, DO   venlafaxine XR (EFFEXOR-XR) 24 hr capsule 150 mg, 150 mg, Oral, Daily, Ghimire, Donalda HERO, MD  Labs and Diagnostic Imaging   CBC:  Recent Labs  Lab 04/01/24 1949 04/01/24 1959 04/02/24 0518 04/03/24 0246  WBC 10.5  --  13.1* 10.0  NEUTROABS 6.8  --   --   --   HGB 14.3   < > 13.6 13.6  HCT 48.8   < > 41.3 41.2  MCV 99.6  --  92.8 91.2  PLT 256  --  201 216   < > = values in this interval not displayed.    Basic Metabolic Panel:  Lab Results  Component Value Date   NA 140 04/03/2024   K 3.4 (L) 04/03/2024   CO2 24 04/03/2024   GLUCOSE 133 (H) 04/03/2024   BUN 5 (L) 04/03/2024   CREATININE 0.78 04/03/2024   CALCIUM  8.6 (L) 04/03/2024   GFRNONAA >60  04/03/2024   GFRAA >60 08/01/2018   Lipid Panel:  Lab Results  Component Value Date   LDLCALC 118 (H) 07/19/2018   HgbA1c:  Lab Results  Component Value Date   HGBA1C 6.7 (H) 04/02/2024   Urine Drug Screen:     Component Value Date/Time   LABOPIA NEGATIVE 04/01/2024 2142   COCAINSCRNUR NEGATIVE 04/01/2024 2142   LABBENZ POSITIVE (A) 04/01/2024 2142   AMPHETMU NEGATIVE 04/01/2024 2142   THCU NEGATIVE 04/01/2024 2142   LABBARB NEGATIVE 04/01/2024 2142    Alcohol Level     Component Value Date/Time   ETH <15 04/01/2024 1949   INR  Lab Results  Component Value Date   INR 1.03 07/17/2018   APTT  Lab Results  Component Value Date   APTT 24 07/17/2018   Ammonia 49  CT Head without contrast(Personally reviewed): No acute abnormality, left frontoparietal and temporal encephalomalacia  rEEG:  This study is suggestive of cortical dysfunction arising from left hemisphere likely secondary to underlying structural abnormality. Additionally there is generalized cerebral dysfunction (encephalopathy). No seizures or epileptiform discharges were seen throughout the recording.   Assessment   Jamie Stewart is a 63 y.o. male with a history of hypertension, hyperlipidemia, anxiety, depression, seizures and left-sided ICH in 2020 who presents from his facility with breakthrough seizures.  According to EMS, he had 3 seizures at the facility as well as 2 seizures for EMS and was treated with Versed.  He does have a lactic acidosis, likely from seizures, which is improving.  Keppra  was increased to 1000 mg twice daily.  EEG is negative for seizures.  Neuroexam is improved.  He does state his name and age but otherwise he does have expressive aphasia.  He does follow commands bilaterally, strength is better on the left than the right.  Unclear what his baseline is, however his current exam is similar to his exam 5 years ago when he was discharged after his hemorrhagic stroke.  Either this is  baseline or suspect that a prolonged postictal state and possible metabolic encephalopathy given elevated ammonia is the etiology of his altered mental status.  Recommendations  - Continue Keppra  1000 mg twice daily - Seizure precautions - Follow-up on urinalysis - Workup and treatment for causes of toxic metabolic encephalopathy per primary team - Neurology will remain available as needed ______________________________________________________________________ Patient seen and examined by NP/APP with MD. MD to update note as needed.   Jorene Last, DNP, FNP-BC [facility] Pager: [phone]   Attending Neurohospitalist Addendum Patient seen and examined with APP/Resident. Agree with the history and physical as documented above. Agree with the plan as documented, which I helped formulate.  I have independently reviewed the chart, obtained history, review of systems and examined the patient.I have personally reviewed pertinent head/neck/spine imaging (CT/MRI). Please feel free to call with any questions.  -- Eligio Lav, MD Neurologist [facility] Pager: [phone]

## 2024-04-03 NOTE — TOC Initial Note (Signed)
 Transition of Care [name]) - Initial/Assessment Note    Patient Details  Name: Jamie Stewart MRN: [id number] Date of Birth: [birth year]  [facility] CM/SW Contact:    Inocente GORMAN Kindle, LCSW Phone Number: 04/03/2024, 5:23 PM  Clinical Narrative:                 Patient admitted from [facility] where he resides under long term care. CSW continuing to follow.   Expected Discharge Plan: Skilled Nursing Facility Barriers to Discharge: Continued Medical Work up   Patient Goals and CMS Choice            Expected Discharge Plan and Services In-house Referral: Clinical Social Work   Post Acute Care Choice: Skilled Nursing Facility Living arrangements for the past 2 months: Skilled Nursing Facility                                      Prior Living Arrangements/Services Living arrangements for the past 2 months: Skilled Nursing Facility Lives with:: Facility Resident Patient language and need for interpreter reviewed:: Yes Do you feel safe going back to the place where you live?: Yes      Need for Family Participation in Patient Care: Yes (Comment) Care giver support system in place?: Yes (comment)   Criminal Activity/Legal Involvement Pertinent to Current Situation/Hospitalization: No - Comment as needed  Activities of Daily Living      Permission Sought/Granted Permission sought to share information with : Facility Medical Sales Representative Permission granted to share information with : No  Share Information with NAME: Schwarzkopf,edna -Sister   [phone]           Emotional Assessment Appearance:: Appears stated age Attitude/Demeanor/Rapport: Unable to Assess Affect (typically observed): Unable to Assess Orientation: :  (Disoriented) Alcohol / Substance Use: Not Applicable Psych Involvement: No (comment)  Admission diagnosis:  Somnolence [R40.0] Seizure (HCC) [R56.9] Hypoxia [R09.02] Patient Active Problem List   Diagnosis Date Noted    Controlled type 2 diabetes mellitus without complication, without long-term current use of insulin  (HCC) 04/02/2024   Lactic acid acidosis 04/02/2024   IVH (intraventricular hemorrhage) (HCC) 08/01/2018   Essential hypertension 08/01/2018   Hyperlipidemia 08/01/2018   Seizure disorder (HCC) 08/01/2018   Cytotoxic brain edema (HCC) 07/18/2018   Brain herniation (HCC) 07/18/2018   ICH (intracerebral hemorrhage) (HCC) 07/17/2018   Other fatigue 08/30/2017   Insomnia 08/30/2017   Nausea and vomiting 08/30/2017   Weight loss 08/30/2017   Seizure (HCC) 05/07/2017   ETOH abuse 05/07/2017   Hypokalemia 05/07/2017   Thrombocytopenia 05/07/2017   Hyperglycemia 05/07/2017   Anemia, unspecified 02/05/2016   Increased prostate specific antigen (PSA) velocity 02/05/2016   Solitary pulmonary nodule 02/02/2016   Acute asthmatic bronchitis 12/27/2013   Chest pain 12/24/2013   Encephalomalacia on imaging study 09/27/2013   Encephalopathy acute 09/26/2013   Cerebral infarction (HCC) 09/25/2013   Acute encephalopathy 09/25/2013   Heart murmur 09/25/2013   Corn 01/09/2013   Left shoulder pain 02/26/2012   Right shoulder pain 06/08/2011   Hematuria 03/05/2011   Encounter for well adult exam with abnormal findings 03/04/2011   PLANTAR FASCIITIS, RIGHT 11/25/2009   Hyperlipidemia 07/22/2007   Anxiety state 07/22/2007   Depression 07/22/2007   Essential hypertension 07/22/2007   NEPHROLITHIASIS, HX OF 07/22/2007   PCP:  Norleen Lynwood ORN, MD Pharmacy:   [facility] [phone] - [place], [place] - [address] AT  [address] [place] [address] Phone: [phone] Fax: [phone]  [facility] - [place], [place] - [address] [place] [address] Phone: [phone] Fax: [phone]     Social Drivers of Health (SDOH) Social History: SDOH Screenings   Food Insecurity: Patient Unable  To Answer (04/02/2024)  Housing: Patient Unable To Answer (04/02/2024)  Transportation Needs: Patient Unable To Answer (04/02/2024)  Utilities: Patient Unable To Answer (04/02/2024)  Tobacco Use: Medium Risk (04/02/2024)   SDOH Interventions:     Readmission Risk Interventions     No data to display

## 2024-04-03 NOTE — Progress Notes (Signed)
 Speech Language Pathology Treatment: Dysphagia  Patient Details Name: Jamie Stewart MRN: [id number] DOB: [birth year] Today's Date: 04/03/2024 Time: 8878-8868 SLP Time Calculation (min) (ACUTE ONLY): 10 min  Assessment / Plan / Recommendation Clinical Impression  Will start Dys 3 diet with thin liquids. Recommend full supervision at least initially given fluctuating mentation s/p seizure. SLP will follow at least briefly.   Pt is alert, stating he feels hungry today. He fed himself regular solids and purees, complaining of dental pain but cleared his oral cavity completely despite prolonged mastication. Sips of thin liquids were observed without signs clinically concerning for aspiration.    HPI HPI: Jamie Stewart is a 63 y.o. male with medical history significant for seizure disorder, hypertension, hyperlipidemia, chronic anxiety/depression, prediabetes, who initially presented to [facility] ER from SNF due to breakthrough seizures. Transfered to [facility] for continuous EEG. MBS 2020 showed primarily shallow and trace penetration with thin liquids, put on nectar thick liquids for a short period of time due to extent of cognitive-linguistic deficits s/p ICH.      SLP Plan  Continue with current plan of care          Recommendations  Diet recommendations: Dysphagia 3 (mechanical soft);Thin liquid Liquids provided via: Cup;Straw Medication Administration: Crushed with puree Supervision: Patient able to self feed;Full supervision/cueing for compensatory strategies Compensations: Minimize environmental distractions;Slow rate;Small sips/bites Postural Changes and/or Swallow Maneuvers: Seated upright 90 degrees                  Oral care BID   Frequent or constant Supervision/Assistance Dysphagia, unspecified (R13.10)     Continue with current plan of care     Damien Blumenthal, M.A., CCC-SLP Speech Language Pathology, Acute Rehabilitation Services  Secure Chat  preferred [phone]   04/03/2024, 11:58 AM

## 2024-04-03 NOTE — Progress Notes (Addendum)
 PROGRESS NOTE        PATIENT DETAILS Name: Jamie Stewart Age: 63 y.o. Sex: male Date of Birth: [birth year] Admit Date: 04/01/2024 Admitting Physician Terry LOISE Hurst, DO ERE:Gnyw, Lynwood ORN, MD  Brief Summary: Patient is a 63 y.o.  male prior ICH with spastic right-sided hemiparesis-sensory aphasia-seizure disorder-presented with breakthrough seizures.  Significant events: 10/29>> admit to [facility].  Significant studies: 10/29>> CT head: No acute intracranial abnormality 10/30>> EEG: No seizures.  Significant microbiology data: 10/29>> COVID/influenza/RSV PCR: Negative 10/29>> blood cultures: No growth  Procedures: None  Consults: Neuro PCCM  Subjective: Some confusion overnight-requiring mittens.  Apparently pulled his IV line out.  Much calmer this morning when I saw him.  Objective: Vitals: Blood pressure (!) 133/94, pulse 98, temperature 98.7 F (37.1 C), temperature source Oral, resp. rate 18, height 6' (1.829 m), weight 97.7 kg, SpO2 90%.   Exam: Gen Exam:Alert awake-not in any distress HEENT:atraumatic, normocephalic Chest: B/L clear to auscultation anteriorly CVS:S1S2 regular Abdomen:soft non tender, non distended Extremities:no edema Neurology: Right-sided hemiparesis-spastic. Skin: no rash  Pertinent Labs/Radiology:    Latest Ref Rng & Units 04/03/2024    2:46 AM 04/02/2024    5:18 AM 04/01/2024    7:59 PM  CBC  WBC 4.0 - 10.5 K/uL 10.0  13.1    Hemoglobin 13.0 - 17.0 g/dL 86.3  86.3  83.6   Hematocrit 39.0 - 52.0 % 41.2  41.3  48.0   Platelets 150 - 400 K/uL 216  201      Lab Results  Component Value Date   NA 140 04/03/2024   K 3.4 (L) 04/03/2024   CL 102 04/03/2024   CO2 24 04/03/2024      Assessment/Plan: Seizure disorder with breakthrough seizures No overt seizures overnight but episodes of confusion/agitation. Remains on increased dose Keppra  Spot EEG negative Neuroimaging negative for any acute  abnormalities Await further recommendations from neurology service  Acute metabolic encephalopathy Suspect prolonged postictal state-some amount of hospital delirium No significant electrolyte derangements-or infection apparent at this time.  No new medications initiated per SNF MAR. Unclear if he is back to baseline but is pleasant this morning-follows simple commands. Suspect will have some amount of hospital related delirium as long as he is hospitalized. Await further recommendations from neurology service.  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  HTN BP stable-not on any antihypertensives currently  Prolonged QTc Attempt to keep K> 4, Mg> 2 Repeat twelve-lead EKG Avoid QTc prolonging agents Telemetry monitoring if he is able to keep his leads on.  Addendum:  QTc has normalized-upon review of EKG done earlier this morning.  Will add as needed Haldol for severe delirium.  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 Resume Namenda/melatonin As needed Haldol Delirium precautions  Mood disorder Resume Effexor-if able to tolerate oral intake today.   Code status:   Code Status: Full Code   DVT Prophylaxis: enoxaparin  (LOVENOX ) injection 40 mg Start: 04/02/24 1000   Family Communication:  None at bedside   Disposition Plan: Status is: Inpatient Remains inpatient appropriate because: Severity of illness   Planned Discharge Destination:Skilled nursing facility   Diet: Diet Order             Diet NPO time specified Except for: Ice Chips, Other (See Comments)   Diet effective now                     Antimicrobial agents: Anti-infectives (From admission, onward)    None        MEDICATIONS: Scheduled Meds:  enoxaparin  (LOVENOX ) injection  40 mg Subcutaneous Daily   insulin  aspart  0-15 Units Subcutaneous Q4H   levETIRAcetam   1,000 mg Intravenous Q12H   Continuous Infusions: PRN Meds:.acetaminophen , LORazepam , polyethylene glycol, prochlorperazine   I have personally reviewed following labs and imaging studies  LABORATORY DATA: CBC: Recent Labs  Lab 04/01/24 1949 04/01/24 1959 04/02/24 0518 04/03/24 0246  WBC 10.5  --  13.1* 10.0  NEUTROABS 6.8  --   --   --   HGB 14.3 16.3 13.6 13.6  HCT 48.8 48.0 41.3 41.2  MCV 99.6  --  92.8 91.2  PLT 256  --  201 216    Basic Metabolic Panel: Recent Labs  Lab 04/01/24 1949 04/01/24 1959 04/02/24 0518 04/03/24 0246  NA 140 140 137 140  K 3.6 3.5 3.6 3.4*  CL 98 103 100 102  CO2 11*  --  23 24  GLUCOSE 291* 285* 172* 133*  BUN 12 12 5* 5*  CREATININE 1.05 0.90 0.94 0.78  CALCIUM  9.2  --  8.2* 8.6*  MG  --   --  1.9  --   PHOS  --   --  1.9*  --     GFR: Estimated Creatinine Clearance: 117.4 mL/min (by C-G formula based on SCr of 0.78 mg/dL).  Liver Function Tests: Recent Labs  Lab 04/01/24 1949 04/02/24 0518  AST 31 24  ALT 16 18  ALKPHOS 140* 91  BILITOT 0.3 0.3  PROT 8.8* 7.8  ALBUMIN 4.2 3.4*   No results for input(s): LIPASE, AMYLASE in the last 168 hours. Recent Labs  Lab 04/01/24 2113  AMMONIA 49*    Coagulation Profile: No results for input(s): INR, PROTIME in the last 168 hours.  Cardiac Enzymes: No results for input(s): CKTOTAL, CKMB, CKMBINDEX, TROPONINI in the last 168 hours.  BNP (last 3 results) No results for input(s): PROBNP in the last 8760 hours.  Lipid Profile: No results for input(s): CHOL, HDL, LDLCALC, TRIG, CHOLHDL, LDLDIRECT in the last 72 hours.  Thyroid Function Tests: Recent Labs     04/01/24 2113  TSH 1.820    Anemia Panel: No results for input(s): VITAMINB12, FOLATE, FERRITIN, TIBC, IRON, RETICCTPCT in the last 72 hours.  Urine analysis:    Component Value Date/Time   COLORURINE STRAW (A) 07/19/2018 0941   APPEARANCEUR CLEAR 07/19/2018 0941   LABSPEC 1.012 07/19/2018 0941   PHURINE 7.0 07/19/2018 0941   GLUCOSEU 50 (A) 07/19/2018 0941   GLUCOSEU NEGATIVE 06/05/2017 1503   HGBUR NEGATIVE 07/19/2018 0941   BILIRUBINUR NEGATIVE 07/19/2018 0941   KETONESUR NEGATIVE 07/19/2018 0941   PROTEINUR NEGATIVE 07/19/2018 0941   UROBILINOGEN 1.0 06/05/2017 1503   NITRITE NEGATIVE 07/19/2018 0941   LEUKOCYTESUR NEGATIVE 07/19/2018 0941    Sepsis Labs: Lactic Acid, Venous    Component Value Date/Time   LATICACIDVEN 4.3 (HH) 04/02/2024 0917    MICROBIOLOGY: Recent Results (from the past 240 hours)  Blood culture (routine x 2)     Status: None (Preliminary result)   Collection Time: 04/01/24  9:00 PM   Specimen: BLOOD  Result Value Ref Range Status   Specimen Description   Final    BLOOD SITE NOT SPECIFIED Performed at [facility], [address], [place], [place] [address]    Special Requests   Final    BOTTLES DRAWN AEROBIC AND ANAEROBIC Blood Culture results may not be optimal due to an inadequate volume of blood received in culture bottles Performed at [facility], [address], [place], [place] [address]    Culture   Final    NO GROWTH < 12 HOURS Performed at [facility], [address] [place], [place] [address]    Report Status PENDING  Incomplete  Blood culture (routine x 2)     Status: None (Preliminary result)   Collection Time: 04/01/24  9:08 PM   Specimen: BLOOD  Result Value Ref Range Status   Specimen Description   Final    BLOOD SITE NOT SPECIFIED Performed at [facility], [address], [place], [place] [address]    Special Requests   Final    Blood  Culture results may not be optimal due to an inadequate volume of blood received in culture bottles BOTTLES DRAWN AEROBIC AND ANAEROBIC Performed at [facility], [address] [place], [place] [address]    Culture   Final    NO GROWTH < 12 HOURS Performed at [facility], [address], [place], [place] [address]    Report Status PENDING  Incomplete  Resp panel by RT-PCR (RSV, Flu A&B, Covid) Anterior Nasal Swab     Status: None   Collection Time: 04/01/24  9:22 PM   Specimen: Anterior Nasal Swab  Result Value Ref Range Status   SARS Coronavirus 2 by RT PCR NEGATIVE NEGATIVE Final    Comment: (NOTE) SARS-CoV-2 target nucleic acids are NOT DETECTED.  The SARS-CoV-2 RNA is generally detectable in upper respiratory specimens during the acute phase of infection. The lowest concentration of SARS-CoV-2 viral copies this assay can detect is 138 copies/mL. A negative result does not preclude SARS-Cov-2 infection and should not be used as the sole basis for treatment or other patient management decisions. A negative result may occur with  improper specimen collection/handling, submission of specimen other than nasopharyngeal swab, presence of viral mutation(s) within the areas targeted by this assay, and inadequate number of viral copies(<138 copies/mL). A negative result must be combined with clinical observations, patient history, and epidemiological information. The expected result is Negative.  Fact Sheet for Patients:  bloggercourse.com  Fact Sheet for Healthcare Providers:  seriousbroker.it  This test is no t yet approved or cleared by the [place]  FDA and  has been authorized for detection and/or diagnosis of SARS-CoV-2 by FDA under an Emergency Use Authorization (EUA). This EUA will remain  in effect (meaning this test can be used) for the duration of the COVID-19 declaration under Section  564(b)(1) of the Act, 21 U.S.C.section 360bbb-3(b)(1), unless the authorization is terminated  or revoked sooner.       Influenza A by PCR NEGATIVE NEGATIVE Final   Influenza B by PCR NEGATIVE NEGATIVE Final    Comment: (NOTE) The Xpert Xpress SARS-CoV-2/FLU/RSV plus assay is intended as an aid in the diagnosis of influenza from Nasopharyngeal swab specimens and should not be used as a sole basis for treatment.  Nasal washings and aspirates are unacceptable for Xpert Xpress SARS-CoV-2/FLU/RSV testing.  Fact Sheet for Patients: bloggercourse.com  Fact Sheet for Healthcare Providers: seriousbroker.it  This test is not yet approved or cleared by the [place]  FDA and has been authorized for detection and/or diagnosis of SARS-CoV-2 by FDA under an Emergency Use Authorization (EUA). This EUA will remain in effect (meaning this test can be used) for the duration of the COVID-19 declaration under Section 564(b)(1) of the Act, 21 U.S.C. section 360bbb-3(b)(1), unless the authorization is terminated or revoked.     Resp Syncytial Virus by PCR NEGATIVE NEGATIVE Final    Comment: (NOTE) Fact Sheet for Patients: bloggercourse.com  Fact Sheet for Healthcare Providers: seriousbroker.it  This test is not yet approved or cleared by the [place]  FDA and has been authorized for detection and/or diagnosis of SARS-CoV-2 by FDA under an Emergency Use Authorization (EUA). This EUA will remain in effect (meaning this test can be used) for the duration of the COVID-19 declaration under Section 564(b)(1) of the Act, 21 U.S.C. section 360bbb-3(b)(1), unless the authorization is terminated or revoked.  Performed at [facility], [address], [place], [place] [address]     RADIOLOGY STUDIES/RESULTS: EEG adult Result Date: 04/02/2024 Shelton Arlin KIDD, MD      04/02/2024  1:37 PM Patient Name: NAKOTA ELSEN MRN: [id number] Epilepsy Attending: Arlin KIDD Shelton Referring Physician/Provider: Michaela Aisha SQUIBB, MD Date: 04/02/2024 Duration: 22.51 mins Patient history: 63 y.o. male with large previous cortical insult and known seizure disorder who presents with breakthrough seizures. EEG to evaluate for seizure Level of alertness: Awake AEDs during EEG study: LEV Technical aspects: This EEG study was done with scalp electrodes positioned according to the 10-20 International system of electrode placement. Electrical activity was reviewed with band pass filter of 1-70Hz , sensitivity of 7 uV/mm, display speed of 49mm/sec with a 60Hz  notched filter applied as appropriate. EEG data were recorded continuously and digitally stored.  Video monitoring was available and reviewed as appropriate. Description: EEG showed continuous generalized and lateralized left hemisphere 3 to 6 Hz theta-delta slowing. Hyperventilation and photic stimulation were not performed.   ABNORMALITY - Continuous slow, generalized and lateralized left hemisphere IMPRESSION: This study is suggestive of cortical dysfunction arising from left hemisphere likely secondary to underlying structural abnormality. Additionally there is generalized cerebral dysfunction (encephalopathy). No seizures or epileptiform discharges were seen throughout the recording. Arlin KIDD Shelton   DG Chest Portable 1 View Result Date: 04/01/2024 CLINICAL DATA:  Hypoxia and seizure. EXAM: PORTABLE CHEST 1 VIEW COMPARISON:  Chest radiograph dated 07/09/2018. FINDINGS: Shallow inspiration. Minimal left lung base atelectasis. Pneumonia is not excluded. No pleural effusion pneumothorax. Stable cardiac silhouette. No acute osseous pathology. IMPRESSION: Shallow inspiration with minimal bibasilar atelectasis. No focal consolidation. Electronically Signed   By: Vanetta Chou M.D.   On: 04/01/2024 20:28   CT Head Wo Contrast Result  Date: 04/01/2024 EXAM: CT HEAD WITHOUT CONTRAST 04/01/2024 08:10:53 PM TECHNIQUE: CT of the head was performed without the administration of intravenous contrast. Automated exposure control, iterative reconstruction, and/or weight based adjustment of the mA/kV was utilized to reduce the radiation dose to as low as reasonably achievable. COMPARISON: CT head 05/07/2017, CT head 05/28/2021 CLINICAL HISTORY: Mental status change, unknown cause; 5 seizures. Table formatting from the original note was not included.; Notes from triage:; Pt arrives EMS from [facility] with reports of seizures. Per EMS pt had three seizures at facility and was given 5mg  IM versed. EMS  reports two additional seizures and gave 2.5mg  IV versed. Pt has hx of same. Pt arrives unresponsive to pain. Pt with ; even respirations. Per EMS pt bit tongue. Pt had tonic clonic seizures with EMS CBG 220. FINDINGS: BRAIN AND VENTRICLES: No acute hemorrhage. No evidence of acute infarct. Similar-appearing left frontoparietal and temporal encephalomalacia with gliosis and associated similar appearing chronic blood products or calcification. Patchy and confluent areas of decreased attenuation are noted throughout the deep and periventricular white matter of the cerebral hemispheres bilaterally, suggestive of chronic microvascular ischemic changes. Stable prominence of the lateral ventricles may be related to central predominant atrophy, although a component of normal pressure hydrocephalus/communicating hydrocephalus cannot be excluded. No extra-axial collection. No mass effect or midline shift. ORBITS: No acute abnormality. SINUSES: Left sphenoid sinus, right ethmoid sinus, and right maxillary sinus mucosal thickening. Paranasal sinuses are otherwise clear. Mastoid air cells are clear. SOFT TISSUES AND SKULL: No acute soft tissue abnormality. No skull fracture. IMPRESSION: 1. No acute intracranial abnormality. 2. Other, non-acute and/or normal findings  as above. Electronically signed by: Morgane Naveau MD 04/01/2024 08:16 PM EDT RP Workstation: [id number]     LOS: 2 days   Donalda Applebaum, MD  [facility]    To contact the attending provider between 7A-7P or the covering provider during after hours 7P-7A, please log into the web site [email or web address] and access using universal [place] password for that web site. If you do not have the password, please call the hospital operator.  04/03/2024, 8:53 AM

## 2024-04-03 NOTE — Evaluation (Signed)
 Physical Therapy Evaluation Patient Details Name: Jamie Stewart MRN: [id number] DOB: 1961-03-23 Today's Date: 04/03/2024  History of Present Illness  63 yo male presents to ED 10/30 with seizures at SNF and 2 en route. PMH includes seizure disorder,  prior ICH with spastic right-sided hemiparesis, HTN, HLD, anxiety/depression, preDM.  Clinical Impression  Pt presents with chronic R spastic hemiplegia, impaired sitting balance, debility, impaired skin integrity especially R side, impaired cognition, communication difficulty, and impaired activity tolerance. Pt to benefit from acute PT to address deficits. Pt requiring max-total assist for to/from EOB, pt unable to progress to standing as pt sat EOB with mod truncal support x2 minutes and then attempted to return self to supine. Unsure if pt is far from baseline or what level of care he was at prior, he was at [facility] but not sure if LTC or rehab side. PT to progress mobility as tolerated, and will continue to follow acutely.          If plan is discharge home, recommend the following: Two people to help with walking and/or transfers;Two people to help with bathing/dressing/bathroom;Assist for transportation;Direct supervision/assist for medications management;Help with stairs or ramp for entrance;Supervision due to cognitive status   Can travel by private vehicle        Equipment Recommendations None recommended by PT  Recommendations for Other Services       Functional Status Assessment Patient has had a recent decline in their functional status and/or demonstrates limited ability to make significant improvements in function in a reasonable and predictable amount of time     Precautions / Restrictions Precautions Precautions: Fall Restrictions Weight Bearing Restrictions Per Provider Order: No      Mobility  Bed Mobility Overal bed mobility: Needs Assistance Bed Mobility: Supine to Sit, Sit to Supine     Supine to sit: Total  assist Sit to supine: Max assist   General bed mobility comments: assist for trunk and LE management, scooting to/from EOB, and boost up in bed with trendelenburg feature. Pt initiating lay back down and assists with boost up in bed with LUE    Transfers                   General transfer comment: nt - pt laying self back down    Ambulation/Gait                  Stairs            Wheelchair Mobility     Tilt Bed    Modified Rankin (Stroke Patients Only)       Balance Overall balance assessment: Needs assistance Sitting-balance support: Feet supported, Single extremity supported Sitting balance-Leahy Scale: Poor Sitting balance - Comments: L lateral bias, requires truncal support for upright sitting       Standing balance comment: nt                             Pertinent Vitals/Pain Pain Assessment Pain Assessment: Faces Faces Pain Scale: Hurts little more Pain Location: RUE and LE with ROM Pain Descriptors / Indicators: Guarding, Grimacing, Discomfort Pain Intervention(s): Monitored during session, Limited activity within patient's tolerance, Repositioned    Home Living Family/patient expects to be discharged to:: Skilled nursing facility                        Prior Function Prior Level of Function : Needs assist;Patient  poor historian/Family not available             Mobility Comments: pt states yes he walks at baseline, but pt is questionable historian with aphasic changes ADLs Comments: assume needs assist     Extremity/Trunk Assessment   Upper Extremity Assessment Upper Extremity Assessment: Defer to OT evaluation    Lower Extremity Assessment Lower Extremity Assessment: RLE deficits/detail;Generalized weakness RLE Deficits / Details: chronic spastic R hemi from L ICH, contractures in both knee (lacking 20 deg extension) and elbow (lacking at least 10 deg extension) RLE: Unable to fully assess due to  pain    Cervical / Trunk Assessment Cervical / Trunk Assessment: Kyphotic  Communication   Communication Communication: Impaired Factors Affecting Communication: Difficulty expressing self;Reduced clarity of speech    Cognition Arousal: Alert Behavior During Therapy: Restless   PT - Cognitive impairments: History of cognitive impairments, No family/caregiver present to determine baseline                       PT - Cognition Comments: history of aphasia, possible dementia mentioned in chart. Oriented to self only         Cueing Cueing Techniques: Verbal cues, Gestural cues, Tactile cues     General Comments General comments (skin integrity, edema, etc.): sloughing of skin noted RUE and RLE    Exercises     Assessment/Plan    PT Assessment Patient needs continued PT services  PT Problem List Decreased strength;Decreased mobility;Decreased activity tolerance;Decreased balance;Decreased knowledge of use of DME;Pain;Decreased safety awareness;Decreased range of motion;Decreased coordination;Decreased cognition;Decreased skin integrity       PT Treatment Interventions DME instruction;Therapeutic activities;Therapeutic exercise;Patient/family education;Balance training;Functional mobility training;Neuromuscular re-education    PT Goals (Current goals can be found in the Care Plan section)  Acute Rehab PT Goals PT Goal Formulation: With patient Time For Goal Achievement: 04/17/24 Potential to Achieve Goals: Fair    Frequency Min 1X/week     Co-evaluation               AM-PAC PT 6 Clicks Mobility  Outcome Measure Help needed turning from your back to your side while in a flat bed without using bedrails?: A Lot Help needed moving from lying on your back to sitting on the side of a flat bed without using bedrails?: Total Help needed moving to and from a bed to a chair (including a wheelchair)?: Total Help needed standing up from a chair using your arms  (e.g., wheelchair or bedside chair)?: Total Help needed to walk in hospital room?: Total Help needed climbing 3-5 steps with a railing? : Total 6 Click Score: 7    End of Session   Activity Tolerance: Patient limited by fatigue Patient left: with call bell/phone within reach;in bed;with bed alarm set;Other (comment) (NT at bedside to place new purewick) Nurse Communication: Mobility status;Need for lift equipment PT Visit Diagnosis: Other abnormalities of gait and mobility (R26.89);Muscle weakness (generalized) (M62.81);Hemiplegia and hemiparesis Hemiplegia - Right/Left: Right Hemiplegia - dominant/non-dominant: Dominant    Time: 8469-8453 PT Time Calculation (min) (ACUTE ONLY): 16 min   Charges:   PT Evaluation $PT Eval Low Complexity: 1 Low   PT General Charges $$ ACUTE PT VISIT: 1 Visit         Johana RAMAN, PT DPT Acute Rehabilitation Services Secure Chat Preferred  Office [phone]   Nimrod Wendt E Johna 04/03/2024, 4:25 PM

## 2024-04-04 DIAGNOSIS — R569 Unspecified convulsions: Secondary | ICD-10-CM | POA: Diagnosis not present

## 2024-04-04 LAB — GLUCOSE, CAPILLARY
Glucose-Capillary: 111 mg/dL — ABNORMAL HIGH (ref 70–99)
Glucose-Capillary: 126 mg/dL — ABNORMAL HIGH (ref 70–99)
Glucose-Capillary: 129 mg/dL — ABNORMAL HIGH (ref 70–99)
Glucose-Capillary: 136 mg/dL — ABNORMAL HIGH (ref 70–99)
Glucose-Capillary: 172 mg/dL — ABNORMAL HIGH (ref 70–99)

## 2024-04-04 LAB — BASIC METABOLIC PANEL WITH GFR
Anion gap: 13 (ref 5–15)
BUN: 11 mg/dL (ref 8–23)
CO2: 23 mmol/L (ref 22–32)
Calcium: 8.7 mg/dL — ABNORMAL LOW (ref 8.9–10.3)
Chloride: 103 mmol/L (ref 98–111)
Creatinine, Ser: 0.75 mg/dL (ref 0.61–1.24)
GFR, Estimated: >60 mL/min (ref >60)
Glucose, Bld: 117 mg/dL — ABNORMAL HIGH (ref 70–99)
Potassium: 3.8 mmol/L (ref 3.5–5.1)
Sodium: 139 mmol/L (ref 135–145)

## 2024-04-04 LAB — MAGNESIUM: Magnesium: 2.1 mg/dL (ref 1.7–2.4)

## 2024-04-04 MED ORDER — LEVETIRACETAM 500 MG PO TABS: 1000.0000 mg | ORAL_TABLET | Freq: Two times a day (BID) | ORAL | Status: AC

## 2024-04-04 MED ORDER — AMLODIPINE BESYLATE 10 MG PO TABS
10.0000 mg | ORAL_TABLET | Freq: Every day | ORAL | Status: DC
  Administered 2024-04-04: 10 mg via ORAL
  Filled 2024-04-04: qty 1

## 2024-04-04 MED ORDER — IRBESARTAN 75 MG PO TABS
75.0000 mg | ORAL_TABLET | Freq: Every day | ORAL | Status: DC
  Administered 2024-04-04: 75 mg via ORAL
  Filled 2024-04-04: qty 1

## 2024-04-04 MED ORDER — ISOSORBIDE MONONITRATE ER 30 MG PO TB24
30.0000 mg | ORAL_TABLET | Freq: Every day | ORAL | Status: DC
  Administered 2024-04-04: 30 mg via ORAL
  Filled 2024-04-04: qty 1

## 2024-04-04 NOTE — Progress Notes (Signed)
 Spoke to sister updated her about patient being discharged back to [facility], sent social work that patient is ready to go, called [facility] to give nursing report to receiving facility.

## 2024-04-04 NOTE — Plan of Care (Signed)
  Problem: Education: Goal: Ability to describe self-care measures that may prevent or decrease complications (Diabetes Survival Skills Education) will improve Outcome: Progressing   Problem: Coping: Goal: Ability to adjust to condition or change in health will improve Outcome: Progressing   Problem: Fluid Volume: Goal: Ability to maintain a balanced intake and output will improve Outcome: Progressing   Problem: Health Behavior/Discharge Planning: Goal: Ability to identify and utilize available resources and services will improve Outcome: Progressing Goal: Ability to manage health-related needs will improve Outcome: Progressing   Problem: Metabolic: Goal: Ability to maintain appropriate glucose levels will improve Outcome: Progressing   Problem: Tissue Perfusion: Goal: Adequacy of tissue perfusion will improve Outcome: Progressing

## 2024-04-04 NOTE — Progress Notes (Signed)
 NEUROLOGY CONSULT FOLLOW UP NOTE   Date of service: April 04, 2024 Patient Name: ETHEL VERONICA MRN:  [id number] DOB:  1961-06-04  Interval Hx/subjective  Seen and examined.  No acute change.  Vitals   Vitals:   04/04/24 0534 04/04/24 0742 04/04/24 0745 04/04/24 1139  BP: (!) 128/90 (!) 138/105 (!) 139/99 (!) 127/93  Pulse: 84 91 85 (!) 105  Resp:    16  Temp: 97.7 F (36.5 C) 97.7 F (36.5 C) 97.7 F (36.5 C) 97.8 F (36.6 C)  TempSrc: Oral Oral Oral Oral  SpO2:      Weight:      Height:         Body mass index is 29.21 kg/m.  Physical Exam   Constitutional: Chronically ill-appearing patient in no acute distress Psych: Affect blunted Eyes: No scleral injection.  HENT: No OP obstrucion.  Head: Normocephalic.  Cardiovascular: Normal rate and regular rhythm.  Respiratory: Effort normal, non-labored breathing.  Skin: WDI.   Neurologic Examination    NEURO:  Mental Status: Alert and oriented to self.  Unable to verbalize much more.  Able to follow simple commands. Cranial Nerves:  II: PERRL.  III, IV, VI: Tracks examiner around the room VII: Subtle right facial droop XII: Tongue cooperative with tongue protrusion Motor: Moves left upper and lower extremities with antigravity strength.  He is weaker on the right lower extremity, but is able to lift it off the bed. Tone: is normal and bulk is normal Sensation-appears intact to light touch Coordination: Unable to perform Gait- deferred   Medications  Current Facility-Administered Medications:    acetaminophen  (TYLENOL ) tablet 500 mg, 500 mg, Oral, Q6H PRN, Shona Laurence N, DO, 500 mg at 04/03/24 9171   amLODipine  (NORVASC ) tablet 10 mg, 10 mg, Oral, Daily, Singh, Prashant K, MD, 10 mg at 04/04/24 0818   atorvastatin  (LIPITOR) tablet 10 mg, 10 mg, Oral, QPM, Ghimire, Shanker M, MD, 10 mg at 04/03/24 1904   baclofen (LIORESAL) tablet 10 mg, 10 mg, Oral, TID, Ghimire, Donalda HERO, MD, 10 mg at 04/04/24 9182    cyanocobalamin  (VITAMIN B12) tablet 500 mcg, 500 mcg, Oral, Daily, Ghimire, Donalda HERO, MD, 500 mcg at 04/03/24 1110   enoxaparin  (LOVENOX ) injection 40 mg, 40 mg, Subcutaneous, Daily, Hall, Carole N, DO, 40 mg at 04/04/24 0818   gabapentin (NEURONTIN) capsule 300 mg, 300 mg, Oral, TID, Ghimire, Donalda HERO, MD, 300 mg at 04/04/24 0817   haloperidol lactate (HALDOL) injection 2 mg, 2 mg, Intravenous, Q6H PRN, Ghimire, Donalda HERO, MD   insulin  aspart (novoLOG ) injection 0-15 Units, 0-15 Units, Subcutaneous, Q4H, Hall, Carole N, DO, 2 Units at 04/03/24 1640   irbesartan  (AVAPRO ) tablet 75 mg, 75 mg, Oral, Daily, Singh, Prashant K, MD   isosorbide  mononitrate (IMDUR ) 24 hr tablet 30 mg, 30 mg, Oral, Daily, Singh, Prashant K, MD, 30 mg at 04/04/24 1142   levETIRAcetam  (KEPPRA ) undiluted injection 1,000 mg, 1,000 mg, Intravenous, Q12H, Michaela Aisha SQUIBB, MD, 1,000 mg at 04/04/24 9182   LORazepam  (ATIVAN ) injection 2 mg, 2 mg, Intravenous, Q6H PRN, Hall, Carole N, DO   melatonin tablet 10 mg, 10 mg, Oral, QHS, Ghimire, Shanker M, MD, 10 mg at 04/03/24 2213   memantine (NAMENDA) tablet 10 mg, 10 mg, Oral, Daily, Ghimire, Shanker M, MD, 10 mg at 04/04/24 0817   polyethylene glycol (MIRALAX / GLYCOLAX) packet 17 g, 17 g, Oral, Daily PRN, Shona, Carole N, DO   prochlorperazine (COMPAZINE) injection 5 mg, 5 mg, Intravenous,  Q6H PRN, Shona Terry SAILOR, DO   venlafaxine XR (EFFEXOR-XR) 24 hr capsule 150 mg, 150 mg, Oral, Daily, Ghimire, Donalda HERO, MD, 150 mg at 04/04/24 0817  Labs and Diagnostic Imaging   CBC:  Recent Labs  Lab 04/01/24 1949 04/01/24 1959 04/02/24 0518 04/03/24 0246  WBC 10.5  --  13.1* 10.0  NEUTROABS 6.8  --   --   --   HGB 14.3   < > 13.6 13.6  HCT 48.8   < > 41.3 41.2  MCV 99.6  --  92.8 91.2  PLT 256  --  201 216   < > = values in this interval not displayed.    Basic Metabolic Panel:  Lab Results  Component Value Date   NA 139 04/04/2024   K 3.8 04/04/2024   CO2 23  04/04/2024   GLUCOSE 117 (H) 04/04/2024   BUN 11 04/04/2024   CREATININE 0.75 04/04/2024   CALCIUM  8.7 (L) 04/04/2024   GFRNONAA >60 04/04/2024   GFRAA >60 08/01/2018   Lipid Panel:  Lab Results  Component Value Date   LDLCALC 118 (H) 07/19/2018   HgbA1c:  Lab Results  Component Value Date   HGBA1C 6.7 (H) 04/02/2024   Urine Drug Screen:     Component Value Date/Time   LABOPIA NEGATIVE 04/01/2024 2142   COCAINSCRNUR NEGATIVE 04/01/2024 2142   LABBENZ POSITIVE (A) 04/01/2024 2142   AMPHETMU NEGATIVE 04/01/2024 2142   THCU NEGATIVE 04/01/2024 2142   LABBARB NEGATIVE 04/01/2024 2142    Alcohol Level     Component Value Date/Time   ETH <15 04/01/2024 1949   INR  Lab Results  Component Value Date   INR 1.03 07/17/2018   APTT  Lab Results  Component Value Date   APTT 24 07/17/2018   Ammonia 49  CT Head without contrast(Personally reviewed): No acute abnormality, left frontoparietal and temporal encephalomalacia.  No new imaging to review today.  rEEG:  This study is suggestive of cortical dysfunction arising from left hemisphere likely secondary to underlying structural abnormality. Additionally there is generalized cerebral dysfunction (encephalopathy). No seizures or epileptiform discharges were seen throughout the recording.   Assessment   AMIAS HUTCHINSON is a 63 y.o. male with a history of hypertension, hyperlipidemia, anxiety, depression, seizures and left-sided ICH in 2020 who presents from his facility with breakthrough seizures.  According to EMS, he had 3 seizures at the facility as well as 2 seizures for EMS and was treated with Versed.  He does have a lactic acidosis, likely from seizures, which is improving.  Keppra  was increased to 1000 mg twice daily.  EEG is negative for seizures.  Neuroexam is improved.  He does state his name and age off and on but otherwise he does have expressive aphasia.  He does follow commands bilaterally, strength is better on the  left than the right.   Likely had a prolonged postictal state to return to a baseline that is nowhere near perfect but is likely back to his baseline.  Recommendations  - Continue Keppra  1000 mg twice daily - Seizure precautions - Neurology will remain available as needed Discussed with Dr. Dennise -- Eligio Lav, MD Neurologist [facility] Pager: [phone]

## 2024-04-04 NOTE — Plan of Care (Signed)
  Problem: Education: Goal: Ability to describe self-care measures that may prevent or decrease complications (Diabetes Survival Skills Education) will improve 04/04/2024 1834 by Con Marylen SAUNDERS, RN Outcome: Adequate for Discharge 04/04/2024 0906 by Con Marylen SAUNDERS, RN Outcome: Progressing Goal: Individualized Educational Video(s) Outcome: Adequate for Discharge   Problem: Coping: Goal: Ability to adjust to condition or change in health will improve 04/04/2024 1834 by Con Marylen SAUNDERS, RN Outcome: Adequate for Discharge 04/04/2024 0906 by Con Marylen SAUNDERS, RN Outcome: Progressing   Problem: Fluid Volume: Goal: Ability to maintain a balanced intake and output will improve 04/04/2024 1834 by Con Marylen SAUNDERS, RN Outcome: Adequate for Discharge 04/04/2024 0906 by Con Marylen SAUNDERS, RN Outcome: Progressing   Problem: Health Behavior/Discharge Planning: Goal: Ability to identify and utilize available resources and services will improve 04/04/2024 1834 by Con Marylen SAUNDERS, RN Outcome: Adequate for Discharge 04/04/2024 0906 by Con Marylen SAUNDERS, RN Outcome: Progressing Goal: Ability to manage health-related needs will improve 04/04/2024 1834 by Con Marylen SAUNDERS, RN Outcome: Adequate for Discharge 04/04/2024 0906 by Con Marylen SAUNDERS, RN Outcome: Progressing   Problem: Metabolic: Goal: Ability to maintain appropriate glucose levels will improve 04/04/2024 1834 by Con Marylen SAUNDERS, RN Outcome: Adequate for Discharge 04/04/2024 0906 by Con Marylen SAUNDERS, RN Outcome: Progressing   Problem: Nutritional: Goal: Maintenance of adequate nutrition will improve Outcome: Adequate for Discharge Goal: Progress toward achieving an optimal weight will improve Outcome: Adequate for Discharge   Problem: Skin Integrity: Goal: Risk for impaired skin integrity will decrease Outcome: Adequate for Discharge   Problem: Tissue Perfusion: Goal: Adequacy of tissue perfusion will  improve 04/04/2024 1834 by Con Marylen SAUNDERS, RN Outcome: Adequate for Discharge 04/04/2024 0906 by Con Marylen SAUNDERS, RN Outcome: Progressing   Problem: Education: Goal: Knowledge of General Education information will improve Description: Including pain rating scale, medication(s)/side effects and non-pharmacologic comfort measures Outcome: Adequate for Discharge   Problem: Health Behavior/Discharge Planning: Goal: Ability to manage health-related needs will improve Outcome: Adequate for Discharge   Problem: Clinical Measurements: Goal: Ability to maintain clinical measurements within normal limits will improve Outcome: Adequate for Discharge Goal: Will remain free from infection Outcome: Adequate for Discharge Goal: Diagnostic test results will improve Outcome: Adequate for Discharge Goal: Respiratory complications will improve Outcome: Adequate for Discharge Goal: Cardiovascular complication will be avoided Outcome: Adequate for Discharge   Problem: Activity: Goal: Risk for activity intolerance will decrease Outcome: Adequate for Discharge   Problem: Nutrition: Goal: Adequate nutrition will be maintained Outcome: Adequate for Discharge   Problem: Coping: Goal: Level of anxiety will decrease Outcome: Adequate for Discharge   Problem: Elimination: Goal: Will not experience complications related to bowel motility Outcome: Adequate for Discharge Goal: Will not experience complications related to urinary retention Outcome: Adequate for Discharge   Problem: Pain Managment: Goal: General experience of comfort will improve and/or be controlled Outcome: Adequate for Discharge   Problem: Safety: Goal: Ability to remain free from injury will improve Outcome: Adequate for Discharge   Problem: Skin Integrity: Goal: Risk for impaired skin integrity will decrease Outcome: Adequate for Discharge   Problem: Safety: Goal: Non-violent Restraint(s) Outcome: Adequate for  Discharge

## 2024-04-04 NOTE — Plan of Care (Signed)
  Problem: Tissue Perfusion: Goal: Adequacy of tissue perfusion will improve Outcome: Progressing   Problem: Health Behavior/Discharge Planning: Goal: Ability to manage health-related needs will improve Outcome: Progressing   Problem: Nutrition: Goal: Adequate nutrition will be maintained Outcome: Progressing

## 2024-04-04 NOTE — Evaluation (Signed)
 Occupational Therapy Evaluation Patient Details Name: Jamie Stewart MRN: [id number] DOB: Jun 13, 1960 Today's Date: 04/04/2024   History of Present Illness   63 yo male presents to ED 10/30 with seizures at SNF and 2 en route. PMH includes seizure disorder,  prior ICH with spastic right-sided hemiparesis, HTN, HLD, anxiety/depression, preDM.     Clinical Impressions At baseline, pt reports he receives assistance for ADLs and ambulates/transfers with assist of [facility] staff. Pt unable to report further. Pt now presents with R UE increased tone, R UE decreased ROM, and decreased safety and independence with functional tasks. Pt currently demonstrating ability to complete ADLs with Set up to Total assist +2 and bed mobility with Max to Total assist. Pt VSS on RA. Pt participated well in session and will benefit from acute OT services to address deficits and increase safety and independence with functional tasks. Post acute discharge, OT recommends return to [facility] with intensive inpatient skilled rehab services < 3 hours per day to maximize rehab potential.      If plan is discharge home, recommend the following:   Two people to help with walking and/or transfers;Assistance with cooking/housework;A lot of help with bathing/dressing/bathroom;Direct supervision/assist for medications management;Direct supervision/assist for financial management;Assist for transportation;Help with stairs or ramp for entrance;Assistance with feeding (Set up for self feeding)     Functional Status Assessment   Patient has had a recent decline in their functional status and demonstrates the ability to make significant improvements in function in a reasonable and predictable amount of time.     Equipment Recommendations   Other (comment) (defer to next level of care)     Recommendations for Other Services         Precautions/Restrictions   Precautions Precautions:  Fall Restrictions Weight Bearing Restrictions Per Provider Order: No     Mobility Bed Mobility Overal bed mobility: Needs Assistance Bed Mobility: Rolling Rolling: Max assist, Total assist         General bed mobility comments: Max assist to roll to R, Total assist to roll to L; further bed mobility deferred this session for pt/therapist safety due to level of assist needed    Transfers Overall transfer level: Needs assistance                 General transfer comment: deferred this session for pt/therapist safety      Balance                                           ADL either performed or assessed with clinical judgement   ADL Overall ADL's : Needs assistance/impaired Eating/Feeding: Set up;Minimal assistance;Bed level (with HOB elevated)   Grooming: Minimal assistance;Contact guard assist;Bed level (with HOB elevated)   Upper Body Bathing: Moderate assistance;Maximal assistance;Bed level;Cueing for compensatory techniques   Lower Body Bathing: Total assistance;Bed level;+2 for physical assistance;+2 for safety/equipment   Upper Body Dressing : Moderate assistance;Maximal assistance;Bed level   Lower Body Dressing: Total assistance;+2 for physical assistance;+2 for safety/equipment;Bed level     Toilet Transfer Details (indicate cue type and reason): deferred this session for pt/therapist safety Toileting- Clothing Manipulation and Hygiene: Total assistance;+2 for physical assistance;+2 for safety/equipment;Bed level               Vision Patient Visual Report: No change from baseline (Pt indicated no change; pt unable to report further) Additional  Comments: Difficult to assess due to aphasia; pt able to visual track object appropriately and makes eye contact appropriately with therapist     Perception         Praxis         Pertinent Vitals/Pain Pain Assessment Pain Assessment: Faces Faces Pain Scale: Hurts little more Pain  Location: RUE and LE with ROM Pain Descriptors / Indicators: Guarding, Grimacing, Discomfort Pain Intervention(s): Monitored during session     Extremity/Trunk Assessment Upper Extremity Assessment Upper Extremity Assessment: Right hand dominant;RUE deficits/detail (L UE strength, ROM, coordination, and sensation WFL) RUE Deficits / Details: generalized weakness; increased tone throughout with very limited AROM throughout; AAROM/PROM shoulder flexion to approximately 80 degrees; AAROM/PROM of elbow, wrist, and all digits of hand WFL; impaired coordination; impaired sensation; pt indicated he has and wears a R hand/wrist splint at [facility], but unable to provide further information RUE Sensation: decreased light touch;decreased proprioception RUE Coordination: decreased fine motor;decreased gross motor   Lower Extremity Assessment Lower Extremity Assessment: Defer to PT evaluation   Cervical / Trunk Assessment Cervical / Trunk Assessment: Kyphotic   Communication Communication Communication: Impaired Factors Affecting Communication: Difficulty expressing self;Reduced clarity of speech;Other (comment) (requires increased time for processing and motor planning)   Cognition Arousal: Alert Behavior During Therapy: WFL for tasks assessed/performed Cognition: Difficult to assess Difficult to assess due to: Impaired communication (secondary to aphasia due to prior CVA)           OT - Cognition Comments: Pt able to follow 1-step commands consistently and appears to accurately/appropriately respond to yes/no questions. However, cogntiion difficult to assess secondary to aphasia at baseline due to prior CVA.                 Following commands: Impaired Following commands impaired: Only follows one step commands consistently, Follows one step commands with increased time, Follows multi-step commands inconsistently, Follows multi-step commands with increased time      Cueing  General Comments   Cueing Techniques: Verbal cues;Gestural cues;Tactile cues;Visual cues  VSS on RA   Exercises     Shoulder Instructions      Home Living Family/patient expects to be discharged to:: Skilled nursing facility                                 Additional Comments: Pt is a resident of [facility].      Prior Functioning/Environment Prior Level of Function : Needs assist;Patient poor historian/Family not available             Mobility Comments: Pt reports he transfers and ambulates with assist; pt unable to provide further information and uncertain if pt is a reliable historian with aphasic changes ADLs Comments: Pt reports Set up for self-feeding and some assistance for bathing, dressing, and toileting from staff, but unable to provide further information.    OT Problem List: Decreased strength;Decreased range of motion;Decreased coordination;Impaired sensation;Impaired tone;Impaired UE functional use   OT Treatment/Interventions: Self-care/ADL training;Therapeutic exercise;DME and/or AE instruction;Splinting;Therapeutic activities;Patient/family education;Balance training      OT Goals(Current goals can be found in the care plan section)   Acute Rehab OT Goals OT Goal Formulation: With patient Time For Goal Achievement: 04/18/24 Potential to Achieve Goals: Good ADL Goals Pt Will Perform Grooming: with supervision;with contact guard assist;sitting (sitting EOB with Fair balance for 3 or more minutes) Pt Will Perform Upper Body Dressing: with min assist;sitting Pt/caregiver  will Perform Home Exercise Program: Increased ROM;Right Upper extremity;With minimal assist;With written HEP provided (AAROM/PROM; decreased tone)   OT Frequency:  Min 1X/week    Co-evaluation              AM-PAC OT 6 Clicks Daily Activity     Outcome Measure Help from another person eating meals?: A Little Help from another person taking care  of personal grooming?: A Little Help from another person toileting, which includes using toliet, bedpan, or urinal?: Total Help from another person bathing (including washing, rinsing, drying)?: A Lot Help from another person to put on and taking off regular upper body clothing?: A Lot Help from another person to put on and taking off regular lower body clothing?: Total 6 Click Score: 12   End of Session Nurse Communication: Mobility status;Other (comment) (OT POC)  Activity Tolerance: Patient tolerated treatment well Patient left: in bed;with call bell/phone within reach;with bed alarm set  OT Visit Diagnosis: Other abnormalities of gait and mobility (R26.89);Muscle weakness (generalized) (M62.81);Ataxia, unspecified (R27.0);Hemiplegia and hemiparesis Hemiplegia - Right/Left: Right Hemiplegia - dominant/non-dominant: Dominant Hemiplegia - caused by: Cerebral infarction (prior to this admission)                Time: 8957-8941 OT Time Calculation (min): 16 min Charges:  OT General Charges $OT Visit: 1 Visit OT Evaluation $OT Eval Low Complexity: 1 Low  Jamie Rockey HERO., OTR/L, MA Acute Rehab [phone]   Jamie Stewart 04/04/2024, 2:04 PM

## 2024-04-04 NOTE — Discharge Instructions (Signed)
 Do not drive, operate heavy machinery, perform activities at heights, swimming or participation in water activities or provide baby sitting services until you have seen by Primary MD or a Neurologist and advised to do so again.  Follow with Primary MD Norleen Lynwood ORN, MD in 7 days   Get CBC, CMP, Magnesium , 2 view Chest X ray -  checked next visit with your primary MD or SNF MD   Activity: As tolerated with Full fall precautions use walker/cane & assistance as needed  Disposition SNF  Diet: Dysphagia 3 diet with feeding assistance and aspiration precautions.  Special Instructions: If you have smoked or chewed Tobacco  in the last 2 yrs please stop smoking, stop any regular Alcohol  and or any Recreational drug use.  On your next visit with your primary care physician please Get Medicines reviewed and adjusted.  Please request your Prim.MD to go over all Hospital Tests and Procedure/Radiological results at the follow up, please get all Hospital records sent to your Prim MD by signing hospital release before you go home.  If you experience worsening of your admission symptoms, develop shortness of breath, life threatening emergency, suicidal or homicidal thoughts you must seek medical attention immediately by calling 911 or calling your MD immediately  if symptoms less severe.  You Must read complete instructions/literature along with all the possible adverse reactions/side effects for all the Medicines you take and that have been prescribed to you. Take any new Medicines after you have completely understood and accpet all the possible adverse reactions/side effects.   Do not drive when taking Pain medications.  Do not take more than prescribed Pain, Sleep and Anxiety Medications  Wear Seat belts while driving.

## 2024-04-04 NOTE — TOC Transition Note (Signed)
 Transition of Care [facility]) - Discharge Note   Patient Details  Name: Jamie Stewart MRN: [id number] Date of Birth: 04-30-1961  Transition of [facility]) CM/SW Contact:  Bridget Cordella Simmonds, LCSW Phone Number: 04/04/2024, 2:15 PM   Clinical Narrative:   Pt discharging to [facility], room 209A. RN report to [phone].   PTAR called [phone].     1040: CSW confirmed with [facility] that they can receive pt today.   Final next level of care: Skilled Nursing Facility Barriers to Discharge: Barriers Resolved   Patient Goals and CMS Choice            Discharge Placement              Patient chooses bed at:  [facility]) Patient to be transferred to facility by: ptar Name of family member notified: sister Maceo Patient and family notified of of transfer: 04/04/24  Discharge Plan and Services Additional resources added to the After Visit Summary for   In-house Referral: Clinical Social Work   Post Acute Care Choice: Skilled Nursing Facility                               Social Drivers of Health (SDOH) Interventions SDOH Screenings   Food Insecurity: Patient Unable To Answer (04/02/2024)  Housing: Patient Unable To Answer (04/02/2024)  Transportation Needs: Patient Unable To Answer (04/02/2024)  Utilities: Patient Unable To Answer (04/02/2024)  Tobacco Use: Medium Risk (04/02/2024)     Readmission Risk Interventions     No data to display

## 2024-04-04 NOTE — Discharge Summary (Signed)
 Jamie Stewart:[id number] DOB: [birth year] DOA: 04/01/2024  PCP: Norleen Lynwood ORN, MD  Admit date: 04/01/2024  Discharge date: 04/04/2024  Admitted From: SNF   Disposition:  SNF   Recommendations for Outpatient Follow-up:   Follow up with PCP in 1-2 weeks  PCP Please obtain BMP/CBC, 2 view CXR in 1week,  (see Discharge instructions)   PCP Please follow up on the following pending results:     Home Health: None   Equipment/Devices: None  Consultations: Neuro, PCCM Discharge Condition: Stable    CODE STATUS: Full    Diet Recommendation: Dysphagia 3 diet with feeding assistance and aspiration precautions.    Chief Complaint  Patient presents with   Seizures     Brief history of present illness from the day of admission and additional interim summary    63 y.o.  male prior ICH with spastic right-sided hemiparesis-sensory aphasia-seizure disorder-presented with breakthrough seizures.   Significant events: 10/29>> admit to [facility].   Significant studies: 10/29>> CT head: No acute intracranial abnormality 10/30>> EEG: No seizures.   Significant microbiology data: 10/29>> COVID/influenza/RSV PCR: Negative 10/29>> blood cultures: No growth                                                                 Hospital Course    Seizure disorder with breakthrough seizures No overt seizures overnight but episodes of confusion/agitation. 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 Resume Namenda/melatonin As needed Haldol Delirium precautions   Mood disorder Resume Effexor-if able to tolerate oral intake today.  DM-2 (A1c 6.7 on 10/30) On diet control monitor at SNF   Discharge diagnosis     Principal Problem:   Seizure [facility]) Active Problems:   Seizure disorder (HCC)   Controlled type 2 diabetes mellitus without complication, without long-term current use of insulin  (HCC)   Lactic acid acidosis    Discharge instructions    Discharge Instructions     Discharge instructions   Complete by: As directed    Do not drive, operate heavy machinery, perform activities at heights, swimming or participation in water  activities or provide baby sitting services until you have seen by Primary MD or a Neurologist and advised to do so again.  Follow with Primary MD Norleen Lynwood ORN, MD in 7 days   Get CBC, CMP, Magnesium , 2 view Chest X ray -  checked next visit with your primary MD or SNF MD   Activity: As tolerated with Full fall precautions use walker/cane & assistance as needed  Disposition SNF  Diet: Dysphagia 3 diet with feeding assistance and aspiration precautions.  Special Instructions: If you have smoked or chewed Tobacco  in the last 2 yrs please stop smoking, stop any regular Alcohol  and or any Recreational drug use.  On your next visit with your primary care physician please Get Medicines reviewed and adjusted.  Please request  your Prim.MD to go over all Hospital Tests and Procedure/Radiological results at the follow up, please get all Hospital records sent to your Prim MD by signing hospital release before you go home.  If you experience worsening of your admission symptoms, develop shortness of breath, life threatening emergency, suicidal or homicidal thoughts you must seek medical attention immediately by calling 911 or calling your MD immediately  if symptoms less severe.  You Must read complete instructions/literature along with all the possible adverse reactions/side effects for all the Medicines you take and that have been prescribed to you. Take any new Medicines after you have completely understood and accpet all the possible adverse reactions/side effects.   Do not drive when taking Pain medications.  Do not take more than prescribed Pain, Sleep and Anxiety Medications  Wear Seat belts while driving.   Increase activity slowly   Complete by: As directed        Discharge Medications   Allergies as of 04/04/2024   No Known Allergies      Medication List     TAKE these medications    aspirin  EC 325 MG tablet Take 325 mg by mouth daily.   atorvastatin  10 MG tablet Commonly known as: LIPITOR Take 10 mg by mouth every evening.   baclofen 10 MG tablet Commonly known as: LIORESAL Take 10 mg by mouth 3 (three) times daily.   chlorhexidine  0.12 % solution Commonly known as: PERIDEX  Use as directed 15 mLs in the mouth or throat 2 (two) times daily.   gabapentin 300 MG capsule Commonly known as: NEURONTIN Take 300 mg by mouth 3 (three) times daily.   irbesartan  300 MG tablet Commonly known as: Avapro  Take 1 tablet (300 mg total) by mouth daily.   latanoprost 0.005 % ophthalmic solution Commonly known as: XALATAN Place 1 drop into both eyes at bedtime.   levETIRAcetam  500 MG tablet Commonly known as: KEPPRA  Take 2 tablets (1,000 mg total) by mouth 2 (two) times daily. What changed: how  much to take   Melatonin 12 MG Tabs Take 12 mg by mouth at bedtime.   memantine 10 MG tablet Commonly known as: NAMENDA Take 10 mg by mouth daily.   multivitamin with minerals tablet Take 1 tablet by mouth daily.   potassium chloride  20 MEQ/15ML (10%) Soln Take 10 mEq by mouth daily in the afternoon.   venlafaxine XR 150 MG 24 hr capsule Commonly known as: EFFEXOR-XR Take 150 mg by mouth daily.   vitamin B-12 500 MCG tablet Commonly known as: CYANOCOBALAMIN  Take 500 mcg by mouth daily.   Vitamin D 50 MCG (2000 UT) Caps Take 2,000 Units by mouth daily.  Follow-up Information     Norleen Lynwood ORN, MD. Schedule an appointment as soon as possible for a visit in 1 week(s).   Specialties: Internal Medicine, Radiology Contact information: [address] [place] [address] [phone]         [facility]. Schedule an appointment as soon as possible for a visit in 1 week(s).   Contact information: [address]     [address] [place]  [address] [phone]                Major procedures and Radiology Reports - PLEASE review detailed and final reports thoroughly  -       EEG adult Result Date: 04/02/2024 Shelton Arlin KIDD, MD     04/02/2024  1:37 PM Patient Name: KORI COLIN MRN: [id number] Epilepsy Attending: Arlin KIDD Shelton Referring Physician/Provider: Michaela Aisha SQUIBB, MD Date: 04/02/2024 Duration: 22.51 mins Patient history: 63 y.o. male with large previous cortical insult and known seizure disorder who presents with breakthrough seizures. EEG to evaluate for seizure Level of alertness: Awake AEDs during EEG study: LEV Technical aspects: This EEG study was done with scalp electrodes positioned according to the 10-20 International system of electrode placement. Electrical activity was reviewed with band pass filter of 1-70Hz , sensitivity of 7 uV/mm, display speed of 60mm/sec with a 60Hz  notched  filter applied as appropriate. EEG data were recorded continuously and digitally stored.  Video monitoring was available and reviewed as appropriate. Description: EEG showed continuous generalized and lateralized left hemisphere 3 to 6 Hz theta-delta slowing. Hyperventilation and photic stimulation were not performed.   ABNORMALITY - Continuous slow, generalized and lateralized left hemisphere IMPRESSION: This study is suggestive of cortical dysfunction arising from left hemisphere likely secondary to underlying structural abnormality. Additionally there is generalized cerebral dysfunction (encephalopathy). No seizures or epileptiform discharges were seen throughout the recording. Arlin KIDD Shelton   DG Chest Portable 1 View Result Date: 04/01/2024 CLINICAL DATA:  Hypoxia and seizure. EXAM: PORTABLE CHEST 1 VIEW COMPARISON:  Chest radiograph dated 07/09/2018. FINDINGS: Shallow inspiration. Minimal left lung base atelectasis. Pneumonia is not excluded. No pleural effusion pneumothorax. Stable cardiac silhouette. No acute osseous pathology. IMPRESSION: Shallow inspiration with minimal bibasilar atelectasis. No focal consolidation. Electronically Signed   By: Vanetta Chou M.D.   On: 04/01/2024 20:28   CT Head Wo Contrast Result Date: 04/01/2024 EXAM: CT HEAD WITHOUT CONTRAST 04/01/2024 08:10:53 PM TECHNIQUE: CT of the head was performed without the administration of intravenous contrast. Automated exposure control, iterative reconstruction, and/or weight based adjustment of the mA/kV was utilized to reduce the radiation dose to as low as reasonably achievable. COMPARISON: CT head 05/07/2017, CT head 05/28/2021 CLINICAL HISTORY: Mental status change, unknown cause; 5 seizures. Table formatting from the original note was not included.; Notes from triage:; Pt arrives EMS from [facility] with reports of seizures. Per EMS pt had three seizures at facility and was given 5mg  IM versed. EMS reports two  additional seizures and gave 2.5mg  IV versed. Pt has hx of same. Pt arrives unresponsive to pain. Pt with ; even respirations. Per EMS pt bit tongue. Pt had tonic clonic seizures with EMS CBG 220. FINDINGS: BRAIN AND VENTRICLES: No acute hemorrhage. No evidence of acute infarct. Similar-appearing left frontoparietal and temporal encephalomalacia with gliosis and associated similar appearing chronic blood products or calcification. Patchy and confluent areas of decreased attenuation are noted throughout the deep and periventricular white matter of the cerebral hemispheres bilaterally, suggestive of chronic microvascular  ischemic changes. Stable prominence of the lateral ventricles may be related to central predominant atrophy, although a component of normal pressure hydrocephalus/communicating hydrocephalus cannot be excluded. No extra-axial collection. No mass effect or midline shift. ORBITS: No acute abnormality. SINUSES: Left sphenoid sinus, right ethmoid sinus, and right maxillary sinus mucosal thickening. Paranasal sinuses are otherwise clear. Mastoid air cells are clear. SOFT TISSUES AND SKULL: No acute soft tissue abnormality. No skull fracture. IMPRESSION: 1. No acute intracranial abnormality. 2. Other, non-acute and/or normal findings as above. Electronically signed by: Morgane Naveau MD 04/01/2024 08:16 PM EDT RP Workstation: [id number]    Micro Results     Recent Results (from the past 240 hours)  Blood culture (routine x 2)     Status: None (Preliminary result)   Collection Time: 04/01/24  9:00 PM   Specimen: BLOOD  Result Value Ref Range Status   Specimen Description   Final    BLOOD SITE NOT SPECIFIED Performed at [facility], [address], [place], [place] [address]    Special Requests   Final    BOTTLES DRAWN AEROBIC AND ANAEROBIC Blood Culture results may not be optimal due to an inadequate volume of blood received in culture bottles Performed at [facility], [address], [place], [place] [address]    Culture   Final    NO GROWTH 2 DAYS Performed at [facility], [address] [place], [place] [address]    Report Status PENDING  Incomplete  Blood culture (routine x 2)     Status: None (Preliminary result)   Collection Time: 04/01/24  9:08 PM   Specimen: BLOOD  Result Value Ref Range Status   Specimen Description   Final    BLOOD SITE NOT SPECIFIED Performed at [facility], [address], [place], [place] [address]    Special Requests   Final    Blood Culture results may not be optimal due to an inadequate volume of blood received in culture bottles BOTTLES DRAWN AEROBIC AND ANAEROBIC Performed at [facility], [address], [place], [place] [address]    Culture   Final    NO GROWTH 2 DAYS Performed at [facility], [address], [place], [place] [address]    Report Status PENDING  Incomplete  Resp panel by RT-PCR (RSV, Flu A&B, Covid) Anterior Nasal Swab     Status: None   Collection Time: 04/01/24  9:22 PM   Specimen: Anterior Nasal Swab  Result Value Ref Range Status   SARS Coronavirus 2 by RT PCR NEGATIVE NEGATIVE Final    Comment: (NOTE) SARS-CoV-2 target nucleic acids are NOT DETECTED.  The SARS-CoV-2 RNA is generally detectable in upper respiratory specimens during the acute phase of infection. The lowest concentration of SARS-CoV-2 viral copies this assay can detect is 138 copies/mL. A negative result does not preclude SARS-Cov-2 infection and should not be used as the sole basis for treatment or other patient management decisions. A negative result may occur with  improper specimen collection/handling, submission of specimen other than nasopharyngeal swab, presence of viral mutation(s) within the areas targeted by this assay, and inadequate number of viral copies(<138 copies/mL). A negative result must be combined with clinical observations,  patient history, and epidemiological information. The expected result is Negative.  Fact Sheet for Patients:  bloggercourse.com  Fact Sheet for Healthcare Providers:  seriousbroker.it  This test is no t yet approved or cleared by the [place]   FDA and  has been authorized for detection and/or diagnosis of SARS-CoV-2 by FDA under an Emergency Use Authorization (EUA). This EUA will remain  in effect (meaning this test can be used) for the duration of the COVID-19 declaration under Section 564(b)(1) of the Act, 21 U.S.C.section 360bbb-3(b)(1), unless the authorization is terminated  or revoked sooner.       Influenza A by PCR NEGATIVE NEGATIVE Final   Influenza B by PCR NEGATIVE NEGATIVE Final    Comment: (NOTE) The Xpert Xpress SARS-CoV-2/FLU/RSV plus assay is intended as an aid in the diagnosis of influenza from Nasopharyngeal swab specimens and should not be used as a sole basis for treatment. Nasal washings and aspirates are unacceptable for Xpert Xpress SARS-CoV-2/FLU/RSV testing.  Fact Sheet for Patients: bloggercourse.com  Fact Sheet for Healthcare Providers: seriousbroker.it  This test is not yet approved or cleared by the [place]  FDA and has been authorized for detection and/or diagnosis of SARS-CoV-2 by FDA under an Emergency Use Authorization (EUA). This EUA will remain in effect (meaning this test can be used) for the duration of the COVID-19 declaration under Section 564(b)(1) of the Act, 21 U.S.C. section 360bbb-3(b)(1), unless the authorization is terminated or revoked.     Resp Syncytial Virus by PCR NEGATIVE NEGATIVE Final    Comment: (NOTE) Fact Sheet for Patients: bloggercourse.com  Fact Sheet for Healthcare Providers: seriousbroker.it  This test is not yet approved or cleared by the [place]  [place] FDA and has been authorized for detection and/or diagnosis of SARS-CoV-2 by FDA under an Emergency Use Authorization (EUA). This EUA will remain in effect (meaning this test can be used) for the duration of the COVID-19 declaration under Section 564(b)(1) of the Act, 21 U.S.C. section 360bbb-3(b)(1), unless the authorization is terminated or revoked.  Performed at [facility], [address] [place], [place] [address]     Today   Subjective    Charlie Quivers today has no headache,no chest abdominal pain,no new weakness tingling or numbness, feels much better wants to go home today.     Objective   Blood pressure (!) 139/99, pulse 85, temperature 97.7 F (36.5 C), temperature source Oral, resp. rate 18, height 6' (1.829 m), weight 97.7 kg, SpO2 90%.   Intake/Output Summary (Last 24 hours) at 04/04/2024 1050 Last data filed at 04/04/2024 0817 Gross per 24 hour  Intake 480 ml  Output 850 ml  Net -370 ml    Exam  Awake Alert, No new F.N deficits, chronic dense right-sided hemiparesis and some expressive aphasia [place].AT,PERRAL Supple Neck,   Symmetrical Chest wall movement, Good air movement bilaterally, CTAB RRR,No Gallops,   +ve B.Sounds, Abd Soft, Non tender,  No Cyanosis, Clubbing or edema    Data Review   Recent Labs  Lab 04/01/24 1949 04/01/24 1959 04/02/24 0518 04/03/24 0246  WBC 10.5  --  13.1* 10.0  HGB 14.3 16.3 13.6 13.6  HCT 48.8 48.0 41.3 41.2  PLT 256  --  201 216  MCV 99.6  --  92.8 91.2  MCH 29.2  --  30.6 30.1  MCHC 29.3*  --  32.9 33.0  RDW 14.3  --  14.5 14.5  LYMPHSABS 2.9  --   --   --   MONOABS 0.5  --   --   --   EOSABS 0.1  --   --   --   BASOSABS 0.1  --   --   --     Recent Labs  Lab 04/01/24 1949 04/01/24 1959 04/01/24 2113 04/01/24 2132 04/01/24 2335 04/02/24 0518 04/02/24 0554 04/02/24 0917 04/03/24 0246 04/04/24 0716  NA 140 140  --   --   --  137  --   --  140 139  K 3.6 3.5  --   --   --   3.6  --   --  3.4* 3.8  CL 98 103  --   --   --  100  --   --  102 103  CO2 11*  --   --   --   --  23  --   --  24 23  ANIONGAP 30*  --   --   --   --  14  --   --  14 13  GLUCOSE 291* 285*  --   --   --  172*  --   --  133* 117*  BUN 12 12  --   --   --  5*  --   --  5* 11  CREATININE 1.05 0.90  --   --   --  0.94  --   --  0.78 0.75  AST 31  --   --   --   --  24  --   --   --   --   ALT 16  --   --   --   --  18  --   --   --   --   ALKPHOS 140*  --   --   --   --  91  --   --   --   --   BILITOT 0.3  --   --   --   --  0.3  --   --   --   --   ALBUMIN 4.2  --   --   --   --  3.4*  --   --   --   --   LATICACIDVEN  --   --   --  7.3* 4.6*  --  4.1* 4.3*  --   --   TSH  --   --  1.820  --   --   --   --   --   --   --   HGBA1C  --   --   --   --   --  6.7*  --   --   --   --   AMMONIA  --   --  49*  --   --   --   --   --   --   --   MG  --   --   --   --   --  1.9  --   --   --  2.1  PHOS  --   --   --   --   --  1.9*  --   --   --   --   CALCIUM  9.2  --   --   --   --  8.2*  --   --  8.6* 8.7*    Total Time in preparing paper work, data evaluation and todays exam - 35 minutes  Signature  -    Lavada Stank M.D on 04/04/2024 at 10:50 AM   -  To page go to [email or web address]

## 2024-04-06 ENCOUNTER — Telehealth: Payer: Self-pay | Admitting: Adult Health

## 2024-04-06 NOTE — Telephone Encounter (Signed)
 [facility] Social Worker and Scheduling) requesting to schedule a hospital follow. Transferred New Patient Referrals.

## 2024-04-07 LAB — CULTURE, BLOOD (ROUTINE X 2)
Culture: NO GROWTH
Culture: NO GROWTH

## 2024-07-07 ENCOUNTER — Inpatient Hospital Stay: Admitting: Family

## 2024-08-05 ENCOUNTER — Inpatient Hospital Stay: Admitting: Neurology

## 2024-12-17 ENCOUNTER — Ambulatory Visit: Admitting: Family Medicine
